# Patient Record
Sex: Female | Born: 1965 | Race: Black or African American | Hispanic: No | Marital: Single | Smoking: Former smoker
Health system: Southern US, Community
[De-identification: ages and names within clinical notes are randomized; demographics above are authoritative.]

## PROBLEM LIST (undated history)

## (undated) DIAGNOSIS — I10 Essential (primary) hypertension: Secondary | ICD-10-CM

## (undated) DIAGNOSIS — E7800 Pure hypercholesterolemia, unspecified: Secondary | ICD-10-CM

## (undated) DIAGNOSIS — Z800 Family history of malignant neoplasm of digestive organs: Secondary | ICD-10-CM

## (undated) HISTORY — DX: Family history of malignant neoplasm of digestive organs: Z80.0

---

## 2005-08-03 ENCOUNTER — Emergency Department (HOSPITAL_COMMUNITY): Admission: EM | Admit: 2005-08-03 | Discharge: 2005-08-03 | Payer: Self-pay | Admitting: Emergency Medicine

## 2007-02-07 ENCOUNTER — Ambulatory Visit (HOSPITAL_COMMUNITY): Admission: RE | Admit: 2007-02-07 | Discharge: 2007-02-07 | Payer: Self-pay | Admitting: Obstetrics & Gynecology

## 2010-10-18 ENCOUNTER — Emergency Department (HOSPITAL_COMMUNITY)
Admission: EM | Admit: 2010-10-18 | Discharge: 2010-10-18 | Disposition: A | Discharge status: Home / Self Care | Payer: Self-pay | Attending: Emergency Medicine | Admitting: Emergency Medicine

## 2010-10-18 DIAGNOSIS — R42 Dizziness and giddiness: Secondary | ICD-10-CM | POA: Insufficient documentation

## 2010-10-18 DIAGNOSIS — F411 Generalized anxiety disorder: Secondary | ICD-10-CM | POA: Insufficient documentation

## 2010-10-18 DIAGNOSIS — I10 Essential (primary) hypertension: Secondary | ICD-10-CM | POA: Insufficient documentation

## 2010-10-18 DIAGNOSIS — R5381 Other malaise: Secondary | ICD-10-CM | POA: Insufficient documentation

## 2010-10-18 LAB — URINALYSIS, ROUTINE W REFLEX MICROSCOPIC
Nitrite: NEGATIVE
Specific Gravity, Urine: 1.01 (ref 1.005–1.030)
Urobilinogen, UA: 0.2 mg/dL (ref 0.0–1.0)

## 2010-10-18 LAB — URINE MICROSCOPIC-ADD ON

## 2010-10-18 LAB — POCT I-STAT, CHEM 8
HCT: 36 % (ref 36.0–46.0)
Hemoglobin: 12.2 g/dL (ref 12.0–15.0)
Potassium: 3.4 mEq/L — ABNORMAL LOW (ref 3.5–5.1)
Sodium: 140 mEq/L (ref 135–145)

## 2010-10-26 NOTE — Op Note (Signed)
Sharon Fischer, Sharon Fischer               ACCOUNT NO.:  [id number]   MEDICAL RECORD NO.:  [id number]          PATIENT TYPE:  AMB   LOCATION:  DAY                           FACILITY:  [facility]   PHYSICIAN:  Lazaro Arms, M.D.   DATE OF BIRTH:  18-Aug-1965   DATE OF PROCEDURE:  02/07/2007  DATE OF DISCHARGE:                               OPERATIVE REPORT   PREOPERATIVE DIAGNOSIS:  Multiparous female desires permanent  sterilization.   POSTOPERATIVE DIAGNOSIS:  Multiparous female desires permanent  sterilization.   PROCEDURE:  Laparoscopic bilateral tubal ligation.   SURGEON:  Lazaro Arms, M.D.   ANESTHESIA:  General endotracheal anesthesia.   FINDINGS:  The patient had normal uterus, tubes, and ovaries.  Some  adhesions from her previous vertical incision of the omentum.   DESCRIPTION OF PROCEDURE:  The patient was taken to the operating room  and placed in the supine position where she underwent general  endotracheal anesthesia.  She was prepped and draped in the usual  sterile fashion.  A Hulka tenaculum was placed in the vagina.  An  incision was made in the umbilicus.  The Veress needle was used.  The  peritoneal cavity was insufflated.  A non-bladed 11 mm trocar was placed  in the peritoneal cavity under direct visualization without difficulty.  The fallopian tubes were identified.  They were burned using the  electrocautery unit, a 2.5 cm segment bilaterally.  There was good  hemostasis.  The instruments were removed.  The gas was allowed to  escape.  The fascia was closed with a single 0 Vicryl suture.  The  subcutaneous tissues were reapproximated with 0 Vicryl suture. The skin  was closed using skin staples.  The patient tolerated the procedure  well.  She experienced no blood loss.  She was taken to the recovery  room in good, stable condition.  All counts were correct x3.      Lazaro Arms, M.D.  Electronically Signed     LHE/MEDQ  D:  02/07/2007  T:  02/07/2007   Job:  [id number]

## 2011-03-25 LAB — DIFFERENTIAL
Basophils Absolute: 0.1
Lymphocytes Relative: 37
Monocytes Absolute: 0.5
Neutro Abs: 2.8
Neutrophils Relative %: 52

## 2011-03-25 LAB — CBC
HCT: 35.7 — ABNORMAL LOW
MCV: 80.9
Platelets: 251
RDW: 17.2 — ABNORMAL HIGH
WBC: 5.4

## 2011-03-25 LAB — COMPREHENSIVE METABOLIC PANEL
Albumin: 3.8
BUN: 4 — ABNORMAL LOW
Chloride: 106
Creatinine, Ser: 0.81
Glucose, Bld: 84
Total Bilirubin: 0.4

## 2011-03-25 LAB — URINE MICROSCOPIC-ADD ON

## 2011-03-25 LAB — URINALYSIS, ROUTINE W REFLEX MICROSCOPIC
Hgb urine dipstick: NEGATIVE
Leukocytes, UA: NEGATIVE
Nitrite: POSITIVE — AB
Protein, ur: NEGATIVE
Specific Gravity, Urine: 1.02
Urobilinogen, UA: 0.2

## 2011-03-25 LAB — HCG, QUANTITATIVE, PREGNANCY: hCG, Beta Chain, Quant, S: <2

## 2020-01-14 ENCOUNTER — Other Ambulatory Visit (HOSPITAL_COMMUNITY): Payer: Self-pay | Admitting: *Deleted

## 2020-01-14 DIAGNOSIS — N6310 Unspecified lump in the right breast, unspecified quadrant: Secondary | ICD-10-CM

## 2020-02-11 ENCOUNTER — Ambulatory Visit (HOSPITAL_COMMUNITY)
Admission: RE | Admit: 2020-02-11 | Discharge: 2020-02-11 | Disposition: A | Discharge status: Home / Self Care | Payer: PRIVATE HEALTH INSURANCE | Source: Ambulatory Visit | Attending: *Deleted | Admitting: *Deleted

## 2020-02-11 ENCOUNTER — Other Ambulatory Visit (HOSPITAL_COMMUNITY): Payer: Self-pay | Admitting: *Deleted

## 2020-02-11 ENCOUNTER — Other Ambulatory Visit: Payer: Self-pay

## 2020-02-11 ENCOUNTER — Encounter (HOSPITAL_COMMUNITY): Payer: Self-pay

## 2020-02-11 DIAGNOSIS — N6310 Unspecified lump in the right breast, unspecified quadrant: Secondary | ICD-10-CM

## 2020-02-18 ENCOUNTER — Other Ambulatory Visit (HOSPITAL_COMMUNITY): Payer: Self-pay | Admitting: *Deleted

## 2020-02-18 DIAGNOSIS — R928 Other abnormal and inconclusive findings on diagnostic imaging of breast: Secondary | ICD-10-CM

## 2020-02-24 ENCOUNTER — Other Ambulatory Visit: Payer: Self-pay

## 2020-02-28 ENCOUNTER — Other Ambulatory Visit (HOSPITAL_COMMUNITY): Payer: Self-pay | Admitting: *Deleted

## 2020-02-28 ENCOUNTER — Other Ambulatory Visit: Payer: Self-pay

## 2020-02-28 ENCOUNTER — Ambulatory Visit (HOSPITAL_COMMUNITY)
Admission: RE | Admit: 2020-02-28 | Discharge: 2020-02-28 | Disposition: A | Discharge status: Home / Self Care | Payer: Medicaid Other | Source: Ambulatory Visit | Attending: *Deleted | Admitting: *Deleted

## 2020-02-28 DIAGNOSIS — R928 Other abnormal and inconclusive findings on diagnostic imaging of breast: Secondary | ICD-10-CM

## 2020-02-28 MED ORDER — LIDOCAINE HCL (PF) 2 % IJ SOLN
INTRAMUSCULAR | Status: AC
  Filled 2020-02-28: qty 30

## 2020-02-28 MED ORDER — LIDOCAINE-EPINEPHRINE (PF) 1 %-1:200000 IJ SOLN
INTRAMUSCULAR | Status: AC
  Filled 2020-02-28: qty 30

## 2020-03-03 ENCOUNTER — Inpatient Hospital Stay (HOSPITAL_COMMUNITY): Admission: RE | Admit: 2020-03-03 | Payer: Self-pay | Source: Ambulatory Visit

## 2020-03-05 LAB — SURGICAL PATHOLOGY

## 2020-03-12 ENCOUNTER — Encounter (INDEPENDENT_AMBULATORY_CARE_PROVIDER_SITE_OTHER): Payer: Self-pay

## 2020-03-12 ENCOUNTER — Encounter: Payer: Self-pay | Admitting: General Surgery

## 2020-03-12 ENCOUNTER — Other Ambulatory Visit: Payer: Self-pay

## 2020-03-12 ENCOUNTER — Ambulatory Visit (INDEPENDENT_AMBULATORY_CARE_PROVIDER_SITE_OTHER): Payer: Medicaid Other | Admitting: General Surgery

## 2020-03-12 VITALS — BP 123/76 | HR 94 | Temp 98.3°F | Resp 14 | Ht 67.5 in | Wt 184.0 lb

## 2020-03-12 DIAGNOSIS — Z171 Estrogen receptor negative status [ER-]: Secondary | ICD-10-CM | POA: Diagnosis not present

## 2020-03-12 DIAGNOSIS — C50211 Malignant neoplasm of upper-inner quadrant of right female breast: Secondary | ICD-10-CM

## 2020-03-12 NOTE — Patient Instructions (Signed)
° °Breast Cancer, Female ° °Breast cancer is a malignant growth of tissue (tumor) in the breast. Unlike noncancerous (benign) tumors, malignant tumors are cancerous and can spread to other parts of the body. The two most common types of breast cancer start in the milk ducts (ductal carcinoma) or in the lobules where milk is made in the breast (lobular carcinoma). Breast cancer is one of the most common types of cancer in women. °What are the causes? °The exact cause of female breast cancer is unknown. °What increases the risk? °The following factors may make you more likely to develop this condition: °· Being older than 55 years of age. °· Race and ethnicity. Caucasian women generally have an increased risk, but African-American women are more likely to develop the disease before age 45. °· Having a family history of breast cancer. °· Having had breast cancer in the past. °· Having certain noncancerous conditions of the breast, such as dense breast tissue. °· Having the BRCA1 and BRCA2 genes. °· Having a history of radiation exposure. °· Obesity. °· Starting menopause after age 55. °· Starting your menstrual periods before age 12. °· Having never been pregnant or having your first child after age 30. °· Having never breastfed. °· Using hormone therapy after menopause. °· Using birth control pills. °· Drinking more than one alcoholic drink a day. °· Exposure to the drug DES, which was given to pregnant women from the 1940s to the 1970s. °What are the signs or symptoms? °Symptoms of this condition include: °· A painless lump or thickening in your breast. °· Changes in the size or shape of your breast. °· Breast skin changes, such as puckering or dimpling. °· Nipple abnormalities, such as scaling, crustiness, redness, or pulling in (retraction). °· Nipple discharge that is bloody or clear. °How is this diagnosed? °This condition may be diagnosed by: °· Taking your medical history and doing a physical exam. During the  exam, your health care provider will feel the tissue around your breast and under your arms. °· Taking a sample of nipple discharge. The sample will be examined under a microscope. °· Performing imaging tests, such as breast X-rays (mammogram), breast ultrasound exams, or an MRI. °· Taking a tissue sample (biopsy) from the breast. The sample will be examined under a microscope to look for cancer cells. °· Taking a sample from the lymph nodes near the affected breast (sentinel node biopsy). °Your cancer will be staged to determine its severity and extent. Staging is a careful attempt to find out the size of the tumor, whether the cancer has spread, and if so, to what parts of the body. Staging also includes testing your tumor for certain receptors, such as estrogen, progesterone, and human epidermal growth factor receptor 2 (HER2). This will help your cancer care team decide on a treatment that will work best for you. You may need to have more tests to determine the stage of your cancer. Stages include the following: °· Stage 0--The tumor has not spread to other breast tissue. °· Stage I--The cancer is only found in the breast or may be in the lymph nodes. The tumor may be up to ¾ in (2 cm) wide. °· Stage II--The cancer has spread to nearby lymph nodes. The tumor may be up to 2 in (5 cm) wide. °· Stage III--The cancer has spread to more distant lymph nodes. The tumor may be larger than 2 in (5 cm) wide. °· Stage IV--The cancer has spread to other parts   the body, such as the bones, brain, liver, or lungs. How is this treated? Treatment for this condition depends on the type and stage of the breast cancer. It may be treated with:  Surgery. This may involve breast-conserving surgery (lumpectomy or partial mastectomy) in which only the part of the breast containing the cancer is removed. Some normal tissue surrounding this area may also be removed. In some cases, surgery may be done to remove the entire breast  (mastectomy) and nipple. Lymph nodes may also be removed.  Radiation therapy, which uses high-energy rays to kill cancer cells.  Chemotherapy, which is the use of drugs to kill cancer cells.  Hormone therapy, which involves taking medicine to adjust the hormone levels in your body. You may take medicine to decrease your estrogen levels. This can help stop cancer cells from growing.  Targeted therapy, in which drugs are used to block the growth and spread of cancer cells. These drugs target a specific part of the cancer cell and usually cause fewer side effects than chemotherapy. Targeted therapy may be used alone or in combination with chemotherapy.  A combination of surgery, radiation, chemotherapy, or hormone therapy may be needed to treat breast cancer. Follow these instructions at home:  Take over-the-counter and prescription medicines only as told by your health care provider.  Eat a healthy diet. A healthy diet includes lots of fruits and vegetables, low-fat dairy products, lean meats, and fiber. ? Make sure half your plate is filled with fruits or vegetables. ? Choose high-fiber foods such as whole-grain breads and cereals.  Consider joining a support group. This may help you learn to cope with the stress of having breast cancer.  Talk to your health care team about exercise and physical activity. The right exercise program can: ? Help prevent or reduce symptoms such as fatigue or depression. ? Improve overall health and survival rates.  Keep all follow-up visits as told by your health care provider. This is important. Where to find more information  American Cancer Society: www.cancer.[place]: www.cancer.gov Contact a health care provider if:  You have a sudden increase in pain.  You have any symptoms or changes that concern you.  You lose weight without trying.  You notice a new lump in either breast or under your arm.  You develop swelling in  either arm or hand.  You have a fever.  You notice new fatigue or weakness. Get help right away if:  You have chest pain or trouble breathing.  You faint. Summary  Breast cancer is a malignant growth of tissue (tumor) in the breast.  Your cancer will be staged to determine its severity and extent.  Treatment for this condition depends on the type and stage of the breast cancer. This information is not intended to replace advice given to you by your health care provider. Make sure you discuss any questions you have with your health care provider. Document Revised: 05/12/2017 Document Reviewed: 01/23/2017 Elsevier Patient Education  [place].

## 2020-03-12 NOTE — Progress Notes (Signed)
Sharon Fischer; [id number]; 02-04-1966   HPI Patient is a 54 year old black female who was referred to my care by the [facility] for evaluation treatment of newly diagnosed right breast cancer.  She states she felt a lump in her right breast approximately 3 months ago.  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  The masses in the upper, inner quadrant of the right breast.  There is also another indistinct mass in the right upper outer quadrant of the right breast which may be carcinoma.  It was suggested that if any of these biopsies were positive, a core biopsy under MRI guidance of the upper outer quadrant mass should be performed.  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  Patient denies any family history of breast cancer.  She denies any nipple discharge.  She has a known sebaceous cyst in the left axilla which has been present for many years. History reviewed. No pertinent past medical history.  History reviewed. No pertinent surgical history.  History reviewed. No pertinent family history.  Current Outpatient Medications on File Prior to Visit  Medication Sig Dispense Refill  . fluticasone (FLONASE) 50 MCG/ACT nasal spray Place into both nostrils.    Marland Kitchen lisinopril-hydrochlorothiazide (ZESTORETIC) 20-12.5 MG tablet Take 1 tablet by mouth daily.     No current facility-administered medications on file prior to visit.    No Known Allergies  Social History   Substance and Sexual Activity  Alcohol Use Never    Social History   Tobacco Use  Smoking Status Current Every Day Smoker  . Types: Cigarettes  Smokeless Tobacco Never Used  Tobacco Comment   7-8 cigs /day    Review of Systems  Constitutional: Negative.   HENT: Negative.   Eyes: Negative.   Respiratory: Negative.   Cardiovascular: Negative.   Gastrointestinal: Negative.    Genitourinary: Negative.   Musculoskeletal: Negative.   Skin: Negative.   Neurological: Positive for headaches.  Endo/Heme/Allergies: Negative.   Psychiatric/Behavioral: Negative.     Objective   Vitals:   03/12/20 1055  BP: 123/76  Pulse: 94  Resp: 14  Temp: 98.3 F (36.8 C)  SpO2: 99%    Physical Exam Vitals reviewed.  Constitutional:      Appearance: Normal appearance. She is not ill-appearing.  HENT:     Head: Normocephalic and atraumatic.  Cardiovascular:     Rate and Rhythm: Normal rate and regular rhythm.     Heart sounds: Normal heart sounds. No murmur heard.  No friction rub. No gallop.   Pulmonary:     Effort: Pulmonary effort is normal. No respiratory distress.     Breath sounds: Normal breath sounds. No stridor. No wheezing, rhonchi or rales.  Lymphadenopathy:     Cervical: No cervical adenopathy.  Skin:    General: Skin is warm and dry.  Neurological:     Mental Status: She is alert and oriented to person, place, and time.   Breast: Right breast with dominant mass noted in the upper, inner quadrant of the right breast.  A palpable lymph node is noted in the right axilla.  Left breast examination reveals no dominant mass, nipple discharge, or dimpling.  The left axilla does have a nodule present which appears to be more a sebaceous cyst.  The punctum is present over lying the cyst in the left axilla.  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  ER/PR negative Mass and right breast, upper outer quadrant, not biopsied Ductal papilloma, left breast Plan   I am concerned that she may have right breast cancer in multiple quadrants.  I do feel an MRI of both breasts is indicated given the findings in both breasts.  I am concerned that she may not be a candidate for a partial mastectomy for the right breast.  Will arrange MRI of the breasts with possible biopsy of the right  breast lesion through the health department.  Further management is pending those results.

## 2020-03-13 DIAGNOSIS — C50919 Malignant neoplasm of unspecified site of unspecified female breast: Secondary | ICD-10-CM

## 2020-03-13 HISTORY — DX: Malignant neoplasm of unspecified site of unspecified female breast: C50.919

## 2020-03-24 ENCOUNTER — Encounter (HOSPITAL_COMMUNITY): Payer: Self-pay

## 2020-03-24 NOTE — Progress Notes (Signed)
I have attempted to reach the patient today for an introductory phone call. Unable to reach patient at this time. I will plan to meet with the patient tomorrow during her initial visit with Dr. Delton Coombes.

## 2020-03-25 ENCOUNTER — Ambulatory Visit (HOSPITAL_COMMUNITY): Payer: Self-pay | Admitting: Hematology

## 2020-03-26 ENCOUNTER — Other Ambulatory Visit (HOSPITAL_COMMUNITY): Payer: Self-pay | Admitting: General Surgery

## 2020-03-26 DIAGNOSIS — Z171 Estrogen receptor negative status [ER-]: Secondary | ICD-10-CM

## 2020-03-26 DIAGNOSIS — C50211 Malignant neoplasm of upper-inner quadrant of right female breast: Secondary | ICD-10-CM

## 2020-03-27 ENCOUNTER — Other Ambulatory Visit: Payer: Self-pay | Admitting: Physician Assistant

## 2020-03-27 NOTE — Addendum Note (Signed)
Addended by: Candiss Norse A on: 03/27/2020 11:44 AM   Modules accepted: Orders

## 2020-03-30 ENCOUNTER — Ambulatory Visit (HOSPITAL_COMMUNITY)
Admission: RE | Admit: 2020-03-30 | Discharge: 2020-03-30 | Disposition: A | Discharge status: Home / Self Care | Payer: Medicaid Other | Source: Ambulatory Visit | Attending: General Surgery | Admitting: General Surgery

## 2020-03-30 ENCOUNTER — Other Ambulatory Visit: Payer: Self-pay

## 2020-03-30 DIAGNOSIS — F17210 Nicotine dependence, cigarettes, uncomplicated: Secondary | ICD-10-CM | POA: Insufficient documentation

## 2020-03-30 DIAGNOSIS — C50211 Malignant neoplasm of upper-inner quadrant of right female breast: Secondary | ICD-10-CM | POA: Diagnosis not present

## 2020-03-30 DIAGNOSIS — Z79899 Other long term (current) drug therapy: Secondary | ICD-10-CM | POA: Insufficient documentation

## 2020-03-30 DIAGNOSIS — Z171 Estrogen receptor negative status [ER-]: Secondary | ICD-10-CM | POA: Diagnosis not present

## 2020-03-30 HISTORY — PX: IR IMAGING GUIDED PORT INSERTION: IMG5740

## 2020-03-30 MED ORDER — MIDAZOLAM HCL 2 MG/2ML IJ SOLN
INTRAMUSCULAR | Status: AC
  Filled 2020-03-30: qty 2

## 2020-03-30 MED ORDER — MIDAZOLAM HCL 2 MG/2ML IJ SOLN
INTRAMUSCULAR | Status: AC | PRN
  Administered 2020-03-30: 1 mg via INTRAVENOUS

## 2020-03-30 MED ORDER — LIDOCAINE HCL 1 % IJ SOLN
INTRAMUSCULAR | Status: AC
  Filled 2020-03-30: qty 20

## 2020-03-30 MED ORDER — FENTANYL CITRATE (PF) 100 MCG/2ML IJ SOLN
INTRAMUSCULAR | Status: AC | PRN
  Administered 2020-03-30: 50 ug via INTRAVENOUS

## 2020-03-30 MED ORDER — HEPARIN SOD (PORK) LOCK FLUSH 100 UNIT/ML IV SOLN
INTRAVENOUS | Status: AC
  Administered 2020-03-30: 500 [IU]
  Filled 2020-03-30: qty 5

## 2020-03-30 MED ORDER — SODIUM CHLORIDE 0.9 % IV SOLN: INTRAVENOUS | Status: DC

## 2020-03-30 MED ORDER — FENTANYL CITRATE (PF) 100 MCG/2ML IJ SOLN
INTRAMUSCULAR | Status: DC
Start: 2020-03-30 — End: 2020-03-31
  Filled 2020-03-30: qty 2

## 2020-03-30 MED ORDER — LIDOCAINE HCL (PF) 1 % IJ SOLN
INTRAMUSCULAR | Status: AC | PRN
  Administered 2020-03-30: 10 mL

## 2020-03-30 MED ORDER — CEFAZOLIN SODIUM-DEXTROSE 2-4 GM/100ML-% IV SOLN
2.0000 g | Freq: Once | INTRAVENOUS | Status: AC
  Administered 2020-03-30: 2 g via INTRAVENOUS
  Filled 2020-03-30: qty 100

## 2020-03-30 NOTE — Sedation Documentation (Signed)
Attempted to call report to short stay. No one was available to take report at this time.

## 2020-03-30 NOTE — Procedures (Signed)
Interventional Radiology Procedure Note  Procedure: Placement of a right IJ approach single lumen PowerPort.  Tip is positioned at the superior cavoatrial junction and catheter is ready for immediate use.  Complications: None Recommendations:  - Ok to shower tomorrow - Do not submerge for 7 days - Routine line care   Signed,  Serge Main S. Bates [place], DO   

## 2020-03-30 NOTE — Discharge Instructions (Addendum)
Implanted Port Insertion, Care After This sheet gives you information about how to care for yourself after your procedure. Your health care provider may also give you more specific instructions. If you have problems or questions, contact your health care provider. What can I expect after the procedure? After the procedure, it is common to have:  Discomfort at the port insertion site.  Bruising on the skin over the port. This should improve over 3-4 days. Follow these instructions at home: Port care  After your port is placed, you will get a manufacturer's information card. The card has information about your port. Keep this card with you at all times.  Take care of the port as told by your health care provider. Ask your health care provider if you or a family member can get training for taking care of the port at home. A home health care nurse may also take care of the port.  Make sure to remember what type of port you have. Incision care      Follow instructions from your health care provider about how to take care of your port insertion site. Make sure you: ? Wash your hands with soap and water before and after you change your bandage (dressing). If soap and water are not available, use hand sanitizer. ? Change your dressing as told by your health care provider. ? Leave stitches (sutures), skin glue, or adhesive strips in place. These skin closures may need to stay in place for 2 weeks or longer. If adhesive strip edges start to loosen and curl up, you may trim the loose edges. Do not remove adhesive strips completely unless your health care provider tells you to do that.  Check your port insertion site every day for signs of infection. Check for: ? Redness, swelling, or pain. ? Fluid or blood. ? Warmth. ? Pus or a bad smell. Activity  Return to your normal activities as told by your health care provider. Ask your health care provider what activities are safe for you.  Do not  lift anything that is heavier than 10 lb (4.5 kg), or the limit that you are told, until your health care provider says that it is safe. General instructions  Take over-the-counter and prescription medicines only as told by your health care provider.  Do not take baths, swim, or use a hot tub until your health care provider approves. Ask your health care provider if you may take showers. You may only be allowed to take sponge baths.  Do not drive for 24 hours if you were given a sedative during your procedure.  Wear a medical alert bracelet in case of an emergency. This will tell any health care providers that you have a port.  Keep all follow-up visits as told by your health care provider. This is important. Contact a health care provider if:  You cannot flush your port with saline as directed, or you cannot draw blood from the port.  You have a fever or chills.  You have redness, swelling, or pain around your port insertion site.  You have fluid or blood coming from your port insertion site.  Your port insertion site feels warm to the touch.  You have pus or a bad smell coming from the port insertion site. Get help right away if:  You have chest pain or shortness of breath.  You have bleeding from your port that you cannot control. Summary  Take care of the port as told by your health   care provider. Keep the manufacturer's information card with you at all times.  Change your dressing as told by your health care provider.  Contact a health care provider if you have a fever or chills or if you have redness, swelling, or pain around your port insertion site.  Keep all follow-up visits as told by your health care provider. This information is not intended to replace advice given to you by your health care provider. Make sure you discuss any questions you have with your health care provider. Document Revised: 12/26/2017 Document Reviewed: 12/26/2017 Elsevier Patient Education   2020 Elsevier Inc. Moderate Conscious Sedation, Adult Sedation is the use of medicines to promote relaxation and relieve discomfort and anxiety. Moderate conscious sedation is a type of sedation. Under moderate conscious sedation, you are less alert than normal, but you are still able to respond to instructions, touch, or both. Moderate conscious sedation is used during short medical and dental procedures. It is milder than deep sedation, which is a type of sedation under which you cannot be easily woken up. It is also milder than general anesthesia, which is the use of medicines to make you unconscious. Moderate conscious sedation allows you to return to your regular activities sooner. Tell a health care provider about:  Any allergies you have.  All medicines you are taking, including vitamins, herbs, eye drops, creams, and over-the-counter medicines.  Use of steroids (by mouth or creams).  Any problems you or family members have had with sedatives and anesthetic medicines.  Any blood disorders you have.  Any surgeries you have had.  Any medical conditions you have, such as sleep apnea.  Whether you are pregnant or may be pregnant.  Any use of cigarettes, alcohol, marijuana, or street drugs. What are the risks? Generally, this is a safe procedure. However, problems may occur, including:  Getting too much medicine (oversedation).  Nausea.  Allergic reaction to medicines.  Trouble breathing. If this happens, a breathing tube may be used to help with breathing. It will be removed when you are awake and breathing on your own.  Heart trouble.  Lung trouble. What happens before the procedure? Staying hydrated Follow instructions from your health care provider about hydration, which may include:  Up to 2 hours before the procedure - you may continue to drink clear liquids, such as water, clear fruit juice, black coffee, and plain tea. Eating and drinking restrictions Follow  instructions from your health care provider about eating and drinking, which may include:  8 hours before the procedure - stop eating heavy meals or foods such as meat, fried foods, or fatty foods.  6 hours before the procedure - stop eating light meals or foods, such as toast or cereal.  6 hours before the procedure - stop drinking milk or drinks that contain milk.  2 hours before the procedure - stop drinking clear liquids. Medicine Ask your health care provider about:  Changing or stopping your regular medicines. This is especially important if you are taking diabetes medicines or blood thinners.  Taking medicines such as aspirin and ibuprofen. These medicines can thin your blood. Do not take these medicines before your procedure if your health care provider instructs you not to.  Tests and exams  You will have a physical exam.  You may have blood tests done to show: ? How well your kidneys and liver are working. ? How well your blood can clot. General instructions  Plan to have someone take you home from the   hospital or clinic.  If you will be going home right after the procedure, plan to have someone with you for 24 hours. What happens during the procedure?  An IV tube will be inserted into one of your veins.  Medicine to help you relax (sedative) will be given through the IV tube.  The medical or dental procedure will be performed. What happens after the procedure?  Your blood pressure, heart rate, breathing rate, and blood oxygen level will be monitored often until the medicines you were given have worn off.  Do not drive for 24 hours. This information is not intended to replace advice given to you by your health care provider. Make sure you discuss any questions you have with your health care provider. Document Revised: 05/12/2017 Document Reviewed: 09/19/2015 Elsevier Patient Education  2020 Elsevier Inc.  

## 2020-03-30 NOTE — Progress Notes (Signed)
Discharge instructions reviewed with pt and Terri (her niece) via telephone. Both voice understanding.

## 2020-03-30 NOTE — H&P (Signed)
Chief Complaint: Patient was seen in consultation today for breast cancer/Port-a-cath placement.  Referring Physician(s): Toth,Paul III  Supervising Physician: Corrie Mckusick  Patient Status: [facility] - Out-pt  History of Present Illness: Sharon Fischer is a 54 y.o. female with a past medical history of breast cancer and current tobacco abuse. She was unfortunately diagnosed with breast cancer in 02/2020. Her cancer is managed by Dr. Marlou Starks (surgery) and Dr. Delton Coombes (oncology). She has tentative plans to begin systemic chemotherapy as management.  IR consulted by Dr. Marlou Starks for possible image-guided Port-a-cath placement. Patient awake and alert laying in bed with no complaints at this time. Denies fever, chills, chest pain, dyspnea, abdominal pain, or headache.   No past medical history on file.  No past surgical history on file.  Allergies: Patient has no known allergies.  Medications: Prior to Admission medications   Medication Sig Start Date End Date Taking? Authorizing Provider  lisinopril-hydrochlorothiazide (ZESTORETIC) 20-12.5 MG tablet Take 1 tablet by mouth daily. 01/14/20  Yes [provider]  fluticasone (FLONASE) 50 MCG/ACT nasal spray Place into both nostrils. 11/05/19   [provider]     No family history on file.  Social History   Socioeconomic History  . Marital status: Single    Spouse name: Not on file  . Number of children: Not on file  . Years of education: Not on file  . Highest education level: Not on file  Occupational History  . Not on file  Tobacco Use  . Smoking status: Current Every Day Smoker    Types: Cigarettes  . Smokeless tobacco: Never Used  . Tobacco comment: 7-8 cigs /day  Substance and Sexual Activity  . Alcohol use: Never  . Drug use: Never  . Sexual activity: Not on file  Other Topics Concern  . Not on file  Social History Narrative  . Not on file   Social Determinants of Health   Financial Resource  Strain:   . Difficulty of Paying Living Expenses: Not on file  Food Insecurity:   . Worried About Charity fundraiser in the Last Year: Not on file  . Ran Out of Food in the Last Year: Not on file  Transportation Needs:   . Lack of Transportation (Medical): Not on file  . Lack of Transportation (Non-Medical): Not on file  Physical Activity:   . Days of Exercise per Week: Not on file  . Minutes of Exercise per Session: Not on file  Stress:   . Feeling of Stress : Not on file  Social Connections:   . Frequency of Communication with Friends and Family: Not on file  . Frequency of Social Gatherings with Friends and Family: Not on file  . Attends Religious Services: Not on file  . Active Member of Clubs or Organizations: Not on file  . Attends Archivist Meetings: Not on file  . Marital Status: Not on file     Review of Systems: A 12 point ROS discussed and pertinent positives are indicated in the HPI above.  All other systems are negative.  Review of Systems  Constitutional: Negative for chills and fever.  Respiratory: Negative for shortness of breath and wheezing.   Cardiovascular: Negative for chest pain and palpitations.  Gastrointestinal: Negative for abdominal pain.  Neurological: Negative for headaches.  Psychiatric/Behavioral: Negative for behavioral problems and confusion.    Vital Signs: BP 119/67   Pulse 91   Ht 5' 7.5" (1.715 m)   Wt 185 lb (83.9 kg)  SpO2 99%   BMI 28.55 kg/m   Physical Exam Vitals and nursing note reviewed.  Constitutional:      General: She is not in acute distress.    Appearance: Normal appearance.  Cardiovascular:     Rate and Rhythm: Normal rate and regular rhythm.     Heart sounds: Normal heart sounds. No murmur heard.   Pulmonary:     Effort: Pulmonary effort is normal. No respiratory distress.     Breath sounds: Normal breath sounds. No wheezing.  Skin:    General: Skin is warm and dry.  Neurological:     Mental  Status: She is alert and oriented to person, place, and time.  Psychiatric:        Mood and Affect: Mood normal.        Behavior: Behavior normal.      MD Evaluation Airway: WNL Heart: WNL Abdomen: WNL Chest/ Lungs: WNL ASA  Classification: 3 Mallampati/Airway Score: Two   Imaging: No results found.  Labs:  CBC: No results for input(s): WBC, HGB, HCT, PLT in the last 8760 hours.  COAGS: No results for input(s): INR, APTT in the last 8760 hours.  BMP: No results for input(s): NA, K, CL, CO2, GLUCOSE, BUN, CALCIUM, CREATININE, GFRNONAA, GFRAA in the last 8760 hours.  Invalid input(s): CMP   Assessment and Plan:  Breast cancer with tentative plans to begin systemic chemotherapy as management. Plan for image-guided Port-a-cath placement today in IR. Patient is NPO. Afebrile. She does not take blood thinners.  Risks and benefits of image-guided Port-a-catheter placement were discussed with the patient including, but not limited to bleeding, infection, pneumothorax, or fibrin sheath development and need for additional procedures. All of the patient's questions were answered, patient is agreeable to proceed. Consent signed and in chart.   Thank you for this interesting consult.  I greatly enjoyed meeting Sharon Fischer and look forward to participating in their care.  A copy of this report was sent to the requesting provider on this date.  Electronically Signed: Earley Abide, PA-C 03/30/2020, 11:47 AM   I spent a total of 30 Minutes in face to face in clinical consultation, greater than 50% of which was counseling/coordinating care for breast cancer/Port-a-cath placement.

## 2020-03-30 NOTE — Progress Notes (Signed)
Location of Breast Cancer: Right Breast Cancer- UIQ  Did patient present with symptoms (if so, please note symptoms) or was this found on screening mammography?: Screening mammogram  Mammogram: 2.2 cm mass in the upper inner quadrant of the right breast as well as an 8 mm mass centrally in the right breast.  2 abnormal lymph nodes.  Histology per Pathology Report:   Receptor Status: ER(-), PR (-), Her2-neu (-), Ki-67(80%)   Past/Anticipated interventions by surgeon, if any: Dr. Marlou Starks 03/24/2020 -The patient appears to have a 2.2 cm cancer in the UIQ of the right breast that is triple negative with 2 positive lymph nodes.   -I think she would benefit from neoadjuvant chemotherapy. -I will refer her to the medical oncologists for consideration of this. -She will need to have a port a cath placed by interventional radiology which we will arrange. -Hopefully receiving neoadjuvant chemotherapy can downstage her axilla which may keep her from having to have a full axillary lymph node dissection.  Past/Anticipated interventions by medical oncology, if any: Chemotherapy  No appointments scheduled yet.   Lymphedema issues, if any:  No  Pain issues, if any:  Pain at port insertion site and mild pain in her breast from biopsy.  SAFETY ISSUES:  Prior radiation? No  Pacemaker/ICD? No  Possible current pregnancy? Postmenopausal  Is the patient on methotrexate? no  Current Complaints / other details:   -Would like to have Chemo here in [place], Gloriajean Dell, RN 03/30/2020,10:33 AM

## 2020-03-31 ENCOUNTER — Ambulatory Visit
Admission: RE | Admit: 2020-03-31 | Discharge: 2020-03-31 | Disposition: A | Discharge status: Home / Self Care | Payer: Medicaid Other | Source: Ambulatory Visit | Attending: Radiation Oncology | Admitting: Radiation Oncology

## 2020-03-31 ENCOUNTER — Encounter: Payer: Self-pay | Admitting: Radiation Oncology

## 2020-03-31 ENCOUNTER — Other Ambulatory Visit: Payer: Self-pay

## 2020-03-31 DIAGNOSIS — Z170 Estrogen receptor positive status [ER+]: Secondary | ICD-10-CM

## 2020-03-31 DIAGNOSIS — Z79899 Other long term (current) drug therapy: Secondary | ICD-10-CM | POA: Insufficient documentation

## 2020-03-31 DIAGNOSIS — C50211 Malignant neoplasm of upper-inner quadrant of right female breast: Secondary | ICD-10-CM | POA: Insufficient documentation

## 2020-03-31 DIAGNOSIS — Z171 Estrogen receptor negative status [ER-]: Secondary | ICD-10-CM | POA: Insufficient documentation

## 2020-03-31 DIAGNOSIS — F17210 Nicotine dependence, cigarettes, uncomplicated: Secondary | ICD-10-CM | POA: Diagnosis not present

## 2020-03-31 NOTE — Progress Notes (Signed)
Radiation Oncology         [phone] ________________________________  Initial Outpatient Consultation - Conducted via telephone due to current COVID-19 concerns for limiting patient exposure  I spoke with the patient to conduct this consult visit via telephone to spare the patient unnecessary potential exposure in the healthcare setting during the current COVID-19 pandemic. The patient was notified in advance and was offered a [facility] meeting to allow for face to face communication but unfortunately reported that they did not have the appropriate resources/technology to support such a visit and instead preferred to proceed with a telephone consult.     Name: Sharon Fischer        MRN: [id number]  Date of Service: 03/31/2020 DOB: 1965-09-12  AG:TXMI, Linwood Dibbles D., PA-C  Jovita Kussmaul, MD     REFERRING PHYSICIAN: [name], MD   DIAGNOSIS: The encounter diagnosis was Malignant neoplasm of upper-inner quadrant of right breast in female, estrogen receptor positive ([place]).   HISTORY OF PRESENT ILLNESS: Sharon Fischer is a 54 y.o. female with a new diagnosis of right breast cancer. The patient was noted to have a palpable mass in the right breast. Diagnostic imaging revealed a 2.2 cm irregular mass in the upper inner right breast with an 8 mm slightly lobulated circumscribed oval mass.  In the left breast there was a 1.5 cm partially circumscribed obscured mass within the left upper inner breast.  By ultrasound the right breast mass measured 2.2 x 1.8 x 1.7 cm at the 1 o'clock position consistent with the palpable abnormality.  No sonographic correlate was seen to the 8 mm lobulated lesion seen on mammography and there were 2 abnormal appearing right axillary nodes.  Her left breast revealed a partially indistinct mass at 11:00 measuring 1.5 x 0.9 x 1.1 cm and no adenopathy was identified.  She subsequently underwent biopsy of the right breast right axillary node, and left breast on 02/28/2020.  In  the right breast at 1:00 an invasive ductal carcinoma was identified that was grade 2-3, the tumor was ER/PR negative HER-2 was equivocal by IHC and negative by FISH.  The lymph node that was sampled was also consistent with carcinoma but no distinct nodal tissue was noted.  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  She is seen today to discuss treatment recommendations for her cancer.  She also met with Dr. Marlou Starks who recommends neoadjuvant chemotherapy with the hopes of downsizing her disease.  Yesterday she had a PAC placed. She is being scheduled as well for MRI.  She is not yet scheduled to meet with medical oncology.  PREVIOUS RADIATION THERAPY: No   PAST MEDICAL HISTORY:  Past Medical History:  Diagnosis Date  . Breast cancer ([place]) 03/2020       PAST SURGICAL HISTORY: Past Surgical History:  Procedure Laterality Date  . IR IMAGING GUIDED PORT INSERTION  03/30/2020     FAMILY HISTORY: History reviewed. No pertinent family history.   SOCIAL HISTORY:  reports that she has been smoking cigarettes. She has never used smokeless tobacco. She reports that she does not drink alcohol and does not use drugs. The patient is single and lives in [place]. She is accompanied by her sister. She prefers her surgical care in [place], but is interested in treatment closer to home.    ALLERGIES: Patient has no known allergies.   MEDICATIONS:  Current Outpatient Medications  Medication Sig Dispense Refill  . fluticasone (FLONASE) 50 MCG/ACT nasal spray Place  into both nostrils.    Marland Kitchen lisinopril-hydrochlorothiazide (ZESTORETIC) 20-12.5 MG tablet Take 1 tablet by mouth daily.    Marland Kitchen atorvastatin (LIPITOR) 40 MG tablet Take by mouth.     No current facility-administered medications for this encounter.     REVIEW OF SYSTEMS: On review of systems, the patient reports that she is doing well overall. She has some soreness at the site of her [facility] placement. She denies any pain  however in her breast, or concerns with nipple bleeding or discharge. No other complaints are verbalized.      PHYSICAL EXAM:  Wt Readings from Last 3 Encounters:  03/31/20 187 lb (84.8 kg)  03/30/20 185 lb (83.9 kg)  03/12/20 184 lb (83.5 kg)   Unable to assess due to encounter type.    ECOG = 0  0 - Asymptomatic (Fully active, able to carry on all predisease activities without restriction)  1 - Symptomatic but completely ambulatory (Restricted in physically strenuous activity but ambulatory and able to carry out work of a light or sedentary nature. For example, light housework, office work)  2 - Symptomatic, <50% in bed during the day (Ambulatory and capable of all self care but unable to carry out any work activities. Up and about more than 50% of waking hours)  3 - Symptomatic, >50% in bed, but not bedbound (Capable of only limited self-care, confined to bed or chair 50% or more of waking hours)  4 - Bedbound (Completely disabled. Cannot carry on any self-care. Totally confined to bed or chair)  5 - Death   Eustace Pen MM, Creech RH, Tormey DC, et al. [phone]). "Toxicity and response criteria of the [facility]". Price Oncol. 5 (6): 649-55    LABORATORY DATA:  Lab Results  Component Value Date   WBC 5.4 02/05/2007   HGB 12.2 10/18/2010   HCT 36.0 10/18/2010   MCV 80.9 02/05/2007   PLT 251 02/05/2007   Lab Results  Component Value Date   NA 140 10/18/2010   K 3.4 (L) 10/18/2010   CL 109 10/18/2010   CO2 25 02/05/2007   Lab Results  Component Value Date   ALT 21 02/05/2007   AST 28 02/05/2007   ALKPHOS 91 02/05/2007   BILITOT 0.4 02/05/2007      RADIOGRAPHY: IR IMAGING GUIDED PORT INSERTION  Result Date: 03/30/2020 INDICATION: 54 year old female referred for port catheter placement EXAM: IMAGE GUIDED PLACEMENT OF PORT CATHETER MEDICATIONS: 2 g Ancef; The antibiotic was administered within an appropriate time interval prior to skin  puncture. ANESTHESIA/SEDATION: Moderate (conscious) sedation was employed during this procedure. A total of Versed 1.0 mg and Fentanyl 50 mcg was administered intravenously. Moderate Sedation Time: 28 minutes. The patient's level of consciousness and vital signs were monitored continuously by radiology nursing throughout the procedure under my direct supervision. FLUOROSCOPY TIME:  Fluoroscopy Time: 0 minutes 6 seconds (1 mGy). COMPLICATIONS: None PROCEDURE: The procedure, risks, benefits, and alternatives were explained to the patient. Questions regarding the procedure were encouraged and answered. The patient understands and consents to the procedure. Ultrasound survey was performed with images stored and sent to PACs. The right neck and chest was prepped with chlorhexidine, and draped in the usual sterile fashion using maximum barrier technique (cap and mask, sterile gown, sterile gloves, large sterile sheet, hand hygiene and cutaneous antiseptic). Antibiotic prophylaxis was provided with 2.0g Ancef administered IV one hour prior to skin incision. Local anesthesia was attained by infiltration with 1% lidocaine without epinephrine. Ultrasound  demonstrated patency of the right internal jugular vein, and this was documented with an image. Under real-time ultrasound guidance, this vein was accessed with a 21 gauge micropuncture needle and image documentation was performed. A small dermatotomy was made at the access site with an 11 scalpel. A 0.018" wire was advanced into the SVC and used to estimate the length of the internal catheter. The access needle exchanged for a 39F micropuncture vascular sheath. The 0.018" wire was then removed and a 0.035" wire advanced into the IVC. An appropriate location for the subcutaneous reservoir was selected below the clavicle and an incision was made through the skin and underlying soft tissues. The subcutaneous tissues were then dissected using a combination of blunt and sharp  surgical technique and a pocket was formed. A single lumen power injectable portacatheter was then tunneled through the subcutaneous tissues from the pocket to the dermatotomy and the port reservoir placed within the subcutaneous pocket. The venous access site was then serially dilated and a peel away vascular sheath placed over the wire. The wire was removed and the port catheter advanced into position under fluoroscopic guidance. The catheter tip is positioned in the cavoatrial junction. This was documented with a spot image. The portacatheter was then tested and found to flush and aspirate well. The port was flushed with saline followed by 100 units/mL heparinized saline. The pocket was then closed in two layers using first subdermal inverted interrupted absorbable sutures followed by a running subcuticular suture. The epidermis was then sealed with Dermabond. The dermatotomy at the venous access site was also seal with Dermabond. Patient tolerated the procedure well and remained hemodynamically stable throughout. No complications encountered and no significant blood loss encountered IMPRESSION: Status post right IJ port catheter placement. Signed, Dulcy Fanny. Dellia Nims, RPVI [facility] Electronically Signed   By: Corrie Mckusick D.O.   On: 03/30/2020 13:19       IMPRESSION/PLAN: 1. Stage IIIB, cT2N1M0 grade 3, triple negative invasive ductal carcinoma of the right breast. I discussed the pathology findings and reviewed the nature of triple negative, node positive breast disease. The consensus for treatment includes neoadjuvant chemotherapy to downsize her disease. Surgical plans are to be determined by her response to neoadjuvant therapy. Most importnatly she has not met with medical oncology so we will help to coordinate this for her. Dr. Lisbeth Renshaw has reviewed her case and recommends radiation in the adjuvant setting.  I reviewed the rationale for using  adjuvant radiotherapy to the breast or chest wall as well as to regional lymph nodes following surgery with the patient and her sister. We discussed the risks, benefits, short, and long term effects of radiotherapy, and the patient is interested in proceeding, but requests being treated with both chemo and radiotherapy closer to home at [facility]. I reviewed the course that would be recommended by Dr. Lisbeth Renshaw with 6 1/2 weeks of radiotherapy to the right chest wall or breast as well as to the right regional nodes. We will defer further radiotherapy discussion and planning to the [facility] team, and see the patient back as needed.  In a visit lasting 60 minutes, greater than 50% of the time was spent face to face discussing the patient's condition, in preparation for the discussion, and coordinating the patient's care.     Carola Rhine, PAC

## 2020-04-02 ENCOUNTER — Other Ambulatory Visit: Payer: Self-pay | Admitting: *Deleted

## 2020-04-02 ENCOUNTER — Inpatient Hospital Stay: Payer: Medicaid Other | Attending: Hematology and Oncology | Admitting: Hematology and Oncology

## 2020-04-02 ENCOUNTER — Telehealth: Payer: Self-pay | Admitting: Hematology and Oncology

## 2020-04-02 ENCOUNTER — Other Ambulatory Visit: Payer: Self-pay

## 2020-04-02 VITALS — BP 126/69 | HR 80 | Temp 98.2°F | Resp 18 | Ht 67.5 in | Wt 184.5 lb

## 2020-04-02 DIAGNOSIS — Z5189 Encounter for other specified aftercare: Secondary | ICD-10-CM | POA: Insufficient documentation

## 2020-04-02 DIAGNOSIS — Z170 Estrogen receptor positive status [ER+]: Secondary | ICD-10-CM | POA: Diagnosis not present

## 2020-04-02 DIAGNOSIS — Z5111 Encounter for antineoplastic chemotherapy: Secondary | ICD-10-CM | POA: Diagnosis present

## 2020-04-02 DIAGNOSIS — C50211 Malignant neoplasm of upper-inner quadrant of right female breast: Secondary | ICD-10-CM | POA: Insufficient documentation

## 2020-04-02 DIAGNOSIS — Z5112 Encounter for antineoplastic immunotherapy: Secondary | ICD-10-CM | POA: Insufficient documentation

## 2020-04-02 DIAGNOSIS — F17210 Nicotine dependence, cigarettes, uncomplicated: Secondary | ICD-10-CM | POA: Insufficient documentation

## 2020-04-02 DIAGNOSIS — Z79899 Other long term (current) drug therapy: Secondary | ICD-10-CM | POA: Diagnosis not present

## 2020-04-02 DIAGNOSIS — I427 Cardiomyopathy due to drug and external agent: Secondary | ICD-10-CM

## 2020-04-02 MED ORDER — ONDANSETRON HCL 8 MG PO TABS: 8.0000 mg | ORAL_TABLET | Freq: Two times a day (BID) | ORAL | 1 refills | Status: DC | PRN

## 2020-04-02 MED ORDER — LORAZEPAM 0.5 MG PO TABS: 0.5000 mg | ORAL_TABLET | Freq: Every evening | ORAL | 0 refills | Status: DC | PRN

## 2020-04-02 MED ORDER — LIDOCAINE-PRILOCAINE 2.5-2.5 % EX CREA: TOPICAL_CREAM | CUTANEOUS | 3 refills | Status: DC

## 2020-04-02 MED ORDER — PROCHLORPERAZINE MALEATE 10 MG PO TABS: 10.0000 mg | ORAL_TABLET | Freq: Four times a day (QID) | ORAL | 1 refills | Status: DC | PRN

## 2020-04-02 NOTE — Progress Notes (Signed)
Per MD request orders for echocardiogram placed and scheduled.  Pt notified of apt date and time and verbalized understanding.

## 2020-04-02 NOTE — Progress Notes (Signed)
START OFF PATHWAY REGIMEN - Breast   [id number]:AC q21 Days C1-4 followed by Carboplatin AUC=6 D1 + Paclitaxel 80 mg/m2 D1,8,15 q21 Days C5-8:   A cycle is every 21 days:     Doxorubicin      Cyclophosphamide      Paclitaxel      Carboplatin   **Always confirm dose/schedule in your pharmacy ordering system**  [id number]:Pembrolizumab 200 mg IV D1 q21 Days:   A cycle is every 21 days:     Pembrolizumab   **Always confirm dose/schedule in your pharmacy ordering system**  Patient Characteristics: Preoperative or Nonsurgical Candidate (Clinical Staging), Neoadjuvant Therapy followed by Surgery, Invasive Disease, Chemotherapy, HER2 Negative/Unknown/Equivocal, ER Negative/Unknown, Platinum Therapy Indicated Therapeutic Status: Preoperative or Nonsurgical Candidate (Clinical Staging) AJCC M Category: cM0 AJCC Grade: G3 Breast Surgical Plan: Neoadjuvant Therapy followed by Surgery ER Status: Negative (-) AJCC 8 Stage Grouping: IIIB HER2 Status: Negative (-) AJCC T Category: cT2 AJCC N Category: cN1 PR Status: Negative (-) Type of Therapy: Platinum Therapy Indicated Intent of Therapy: Curative Intent, Discussed with Patient

## 2020-04-02 NOTE — Assessment & Plan Note (Signed)
01/2020: Palpable right breast mass growing since July 2021.  Mammogram revealed 2.2 cm right breast mass with a 0.8 cm satellite lesion.  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  Pathology and radiology counseling: Discussed with the patient, the details of pathology including the type of breast cancer,the clinical staging, the significance of ER, PR and HER-2/neu receptors and the implications for treatment. After reviewing the pathology in detail, we proceeded to discuss the different treatment options between surgery, radiation, chemotherapy, antiestrogen therapies.  Recommendation based on multidisciplinary tumor board: 1. Neoadjuvant chemotherapy with Adriamycin and Cytoxan pembrolizumab 4 followed by Taxol weekly 12 with carboplatin pembrolizumab every 3 weeks x4, followed by pembrolizumab maintenance to complete 1 year 2. Followed by breast conserving surgery with targeted axillary dissection 3. Followed by adjuvant radiation therapy  Chemotherapy Counseling: I discussed the risks and benefits of chemotherapy including the risks of nausea/ vomiting, risk of infection from low WBC count, fatigue due to chemo or anemia, bruising or bleeding due to low platelets, mouth sores, loss/ change in taste and decreased appetite. Liver and kidney function will be monitored through out chemotherapy as abnormalities in liver and kidney function may be a side effect of treatment.  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 Risk of permanent bone marrow dysfunction due to chemo were also discussed.  Plan: 1. Echocardiogram 2. Chemotherapy class 3. Breast MRI 4. CT chest abdomen pelvis (patient tells me that shes doing these in November)  Start chemotherapy next Wednesday

## 2020-04-02 NOTE — Telephone Encounter (Signed)
Received a new pt referral from dr. Marlou Starks for breast cancer. Sharon Fischer has been cld and scheduled to see Sharon Fischer today at 345pm. Pt aware to arrive 15 minutes early.

## 2020-04-02 NOTE — Progress Notes (Signed)
[place] CONSULT NOTE  Patient Care Team: Muse, Noel Journey., PA-C as PCP - General Dishmon, Garwin Brothers, RN as Oncology Nurse Navigator (Oncology)  CHIEF COMPLAINTS/PURPOSE OF CONSULTATION:  Newly diagnosed breast cancer  HISTORY OF PRESENTING ILLNESS:  Sharon Fischer 54 y.o. female is here because of recent diagnosis of right breast cancer.  She felt the lump in July and wait until August to see her primary care physician who then sent her for mammogram.  The mammogram revealed a 2.2 cm lump in the right breast and a 0.8 cm nodule as well.  She also had a nodule in the left breast measuring 1.6 cm.  She had an enlarged right axillary lymph node.  Which was also biopsied.  The pathology of the right breast came back as grade 3 IDC triple negative with a Ki-67 of 80% and the lymph node was positive.  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  She has seen Dr. Marlou Starks who recommended neoadjuvant chemotherapy and referred her over here. After the entire clinic visit she informed me that she was referred to [facility] for medical oncology consultation.  I was not aware of that initially. She informed me at the end of the visit that she would like to be treated at [facility].  I reviewed her records extensively and collaborated the history with the patient.  SUMMARY OF ONCOLOGIC HISTORY: Oncology History  Malignant neoplasm of upper-inner quadrant of right breast in female, estrogen receptor positive ([place])  01/2020 Initial Diagnosis   Palpable right breast mass growing since July 2021.  Mammogram revealed 2.2 cm right breast mass with a 0.8 cm satellite lesion.  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   03/31/2020 Cancer Staging   Staging form: Breast, AJCC 8th Edition - Clinical stage from 03/31/2020: Stage IIIB (cT2, cN1(f), cM0, G3, ER-, PR-,  HER2-) - Signed by Nicholas Lose, MD on 04/02/2020      MEDICAL HISTORY:  Past Medical History:  Diagnosis Date   Breast cancer ([place]) 03/2020    SURGICAL HISTORY: Past Surgical History:  Procedure Laterality Date   IR IMAGING GUIDED PORT INSERTION  03/30/2020    SOCIAL HISTORY: Social History   Socioeconomic History   Marital status: Single    Spouse name: Not on file   Number of children: Not on file   Years of education: Not on file   Highest education level: Not on file  Occupational History   Not on file  Tobacco Use   Smoking status: Current Every Day Smoker    Types: Cigarettes   Smokeless tobacco: Never Used   Tobacco comment: 7-8 cigs /day  Substance and Sexual Activity   Alcohol use: Never   Drug use: Never   Sexual activity: Not on file  Other Topics Concern   Not on file  Social History Narrative   Not on file   Social Determinants of Health   Financial Resource Strain:    Difficulty of Paying Living Expenses: Not on file  Food Insecurity:    Worried About Running Out of Food in the Last Year: Not on file   Ran Out of Food in the Last Year: Not on file  Transportation Needs:    Lack of Transportation (Medical): Not on file   Lack of Transportation (Non-Medical): Not on file  Physical Activity:  Days of Exercise per Week: Not on file   Minutes of Exercise per Session: Not on file  Stress:    Feeling of Stress : Not on file  Social Connections:    Frequency of Communication with Friends and Family: Not on file   Frequency of Social Gatherings with Friends and Family: Not on file   Attends Religious Services: Not on file   Active Member of Clubs or Organizations: Not on file   Attends Archivist Meetings: Not on file   Marital Status: Not on file  Intimate Partner Violence:    Fear of Current or Ex-Partner: Not on file   Emotionally Abused: Not on file   Physically Abused: Not on file   Sexually  Abused: Not on file    FAMILY HISTORY: No family history on file.  ALLERGIES:  has No Known Allergies.  MEDICATIONS:  Current Outpatient Medications  Medication Sig Dispense Refill   atorvastatin (LIPITOR) 40 MG tablet Take by mouth.     fluticasone (FLONASE) 50 MCG/ACT nasal spray Place into both nostrils.     lidocaine-prilocaine (EMLA) cream Apply to affected area once 30 g 3   lisinopril-hydrochlorothiazide (ZESTORETIC) 20-12.5 MG tablet Take 1 tablet by mouth daily.     LORazepam (ATIVAN) 0.5 MG tablet Take 1 tablet (0.5 mg total) by mouth at bedtime as needed for sleep. 30 tablet 0   ondansetron (ZOFRAN) 8 MG tablet Take 1 tablet (8 mg total) by mouth 2 (two) times daily as needed. Start on the third day after chemotherapy. 30 tablet 1   prochlorperazine (COMPAZINE) 10 MG tablet Take 1 tablet (10 mg total) by mouth every 6 (six) hours as needed (Nausea or vomiting). 30 tablet 1   No current facility-administered medications for this visit.    REVIEW OF SYSTEMS:   Palpable lump in the right breast  PHYSICAL EXAMINATION: ECOG PERFORMANCE STATUS: 1 - Symptomatic but completely ambulatory  Vitals:   04/02/20 1511  BP: 126/69  Pulse: 80  Resp: 18  Temp: 98.2 F (36.8 C)  SpO2: 100%   Filed Weights   04/02/20 1511  Weight: 184 lb 8 oz (83.7 kg)      LABORATORY DATA:  I have reviewed the data as listed Lab Results  Component Value Date   WBC 5.4 02/05/2007   HGB 12.2 10/18/2010   HCT 36.0 10/18/2010   MCV 80.9 02/05/2007   PLT 251 02/05/2007   Lab Results  Component Value Date   NA 140 10/18/2010   K 3.4 (L) 10/18/2010   CL 109 10/18/2010   CO2 25 02/05/2007    RADIOGRAPHIC STUDIES: I have personally reviewed the radiological reports and agreed with the findings in the report.  ASSESSMENT AND PLAN:  Malignant neoplasm of upper-inner quadrant of right breast in female, estrogen receptor positive ([place]) 01/2020: Palpable right breast mass growing  since July 2021.  Mammogram revealed 2.2 cm right breast mass with a 0.8 cm satellite lesion.  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  Pathology and radiology counseling: Discussed with the patient, the details of pathology including the type of breast cancer,the clinical staging, the significance of ER, PR and HER-2/neu receptors and the implications for treatment. After reviewing the pathology in detail, we proceeded to discuss the different treatment options between surgery, radiation, chemotherapy, antiestrogen therapies.  Recommendation based on multidisciplinary tumor board:  1. Neoadjuvant chemotherapy with Adriamycin and Cytoxan pembrolizumab 4 followed by Taxol weekly 12 with carboplatin pembrolizumab every 3 weeks x4, followed by pembrolizumab maintenance to complete 1 year 2. Followed by breast conserving surgery with targeted axillary dissection 3. Followed by adjuvant radiation therapy  Chemotherapy Counseling: I discussed the risks and benefits of chemotherapy including the risks of nausea/ vomiting, risk of infection from low WBC count, fatigue due to chemo or anemia, bruising or bleeding due to low platelets, mouth sores, loss/ change in taste and decreased appetite. Liver and kidney function will be monitored through out chemotherapy as abnormalities in liver and kidney function may be a side effect of treatment.  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 Risk of permanent bone marrow dysfunction due to chemo were also discussed.  Plan: 1. Echocardiogram 2. Chemotherapy class 3. Breast MRI 4. CT chest abdomen pelvis (patient tells me that shes doing these in November)  Start chemotherapy next Wednesday    All questions were answered. The patient knows to call the clinic with any problems, questions  or concerns.    Harriette Ohara, MD 04/02/20

## 2020-04-03 ENCOUNTER — Telehealth: Payer: Self-pay | Admitting: *Deleted

## 2020-04-03 ENCOUNTER — Other Ambulatory Visit: Payer: Self-pay | Admitting: Hematology and Oncology

## 2020-04-03 ENCOUNTER — Encounter: Payer: Self-pay | Admitting: Licensed Clinical Social Worker

## 2020-04-03 ENCOUNTER — Encounter: Payer: Self-pay | Admitting: *Deleted

## 2020-04-03 NOTE — Progress Notes (Signed)
Pharmacist Chemotherapy Monitoring - Initial Assessment    Anticipated start date: 04/09/20  Regimen:   Are orders appropriate based on the patients diagnosis, regimen, and cycle? Yes  Does the plan date match the patients scheduled date? Yes  Is the sequencing of drugs appropriate? Yes  Are the premedications appropriate for the patients regimen? Yes  Prior Authorization for treatment is: Uninsured o If applicable, is the correct biosimilar selected based on the patient's insurance? yes  Organ Function and Labs:  Are dose adjustments needed based on the patient's renal function, hepatic function, or hematologic function? Yes  Are appropriate labs ordered prior to the start of patient's treatment? Yes  Other organ system assessment, if indicated: anthracyclines: Echo/ MUGA  The following baseline labs, if indicated, have been ordered: N/A  Dose Assessment:  Are the drug doses appropriate? Yes  Are the following correct: o Drug concentrations Yes o IV fluid compatible with drug Yes o Administration routes Yes o Timing of therapy No  If applicable, does the patient have documented access for treatment and/or plans for port-a-cath placement? yes  If applicable, have lifetime cumulative doses been properly documented and assessed? not applicable Lifetime Dose Tracking  No doses have been documented on this patient for the following tracked chemicals: Doxorubicin, Epirubicin, Idarubicin, Daunorubicin, Mitoxantrone, Bleomycin, Oxaliplatin, Carboplatin, Liposomal Doxorubicin  o   Toxicity Monitoring/Prevention:  The patient has the following take home antiemetics prescribed: Prochlorperazine  The patient has the following take home medications prescribed: N/A  Medication allergies and previous infusion related reactions, if applicable, have been reviewed and addressed. Yes  The patient's current medication list has been assessed for drug-drug interactions with their  chemotherapy regimen. no significant drug-drug interactions were identified on review.  Order Review:  Are the treatment plan orders signed? Yes  Is the patient scheduled to see a provider prior to their treatment? Yes  I verify that I have reviewed each item in the above checklist and answered each question accordingly.  Sharon Fischer 04/03/2020 2:49 PM

## 2020-04-03 NOTE — Progress Notes (Signed)
The following Medication is approved for drug replacement program by [employer] for Udenyca . The enrollment period is from 04/03/2020 to 04/03/2021. Reason for Assistance: Self Pay. ID: [id number] First DOS covered is 04/11/2020.

## 2020-04-03 NOTE — Progress Notes (Signed)
.  The following biosimilar Udenyca ([id number]) has been selected for use in this patient.  Self pay attempting assistance.  Sharon Fischer

## 2020-04-03 NOTE — Telephone Encounter (Signed)
Spoke to pt, provided navigation resources and contact information. Denies questions or concerns regarding dx or treatment care plan. Confirmed future appts. Encourage pt to call with needs. Received verbal understanding. ?

## 2020-04-03 NOTE — Progress Notes (Signed)
[place] Psychosocial Distress Screening Clinical Social Work  Clinical Social Work was referred by distress screening protocol.  The patient scored a 5 on the Psychosocial Distress Thermometer which indicates moderate distress. Clinical Social Worker attempted to contact patient by phone to assess for distress and other psychosocial needs. No answer. Left VM with direct contact information.  ONCBCN DISTRESS SCREENING 03/31/2020  Screening Type Initial Screening  Distress experienced in past week (1-10) 5  Physical Problem type Pain;Nausea/vomiting  Other Contact via phone [phone]     Clinical Social Worker follow up needed: Yes.  CSW will attempt to see patient during appt next week to introduce services.   Sharon Fischer E, LCSW

## 2020-04-05 ENCOUNTER — Ambulatory Visit
Admission: RE | Admit: 2020-04-05 | Discharge: 2020-04-05 | Disposition: A | Discharge status: Home / Self Care | Payer: Self-pay | Source: Ambulatory Visit | Attending: Hematology and Oncology | Admitting: Hematology and Oncology

## 2020-04-05 ENCOUNTER — Other Ambulatory Visit: Payer: Self-pay

## 2020-04-05 DIAGNOSIS — C50211 Malignant neoplasm of upper-inner quadrant of right female breast: Secondary | ICD-10-CM

## 2020-04-05 DIAGNOSIS — Z170 Estrogen receptor positive status [ER+]: Secondary | ICD-10-CM

## 2020-04-05 MED ORDER — GADOBUTROL 1 MMOL/ML IV SOLN
8.0000 mL | Freq: Once | INTRAVENOUS | Status: AC | PRN
  Administered 2020-04-05: 8 mL via INTRAVENOUS

## 2020-04-06 ENCOUNTER — Other Ambulatory Visit: Payer: Self-pay | Admitting: *Deleted

## 2020-04-06 ENCOUNTER — Encounter: Payer: Self-pay | Admitting: *Deleted

## 2020-04-06 ENCOUNTER — Telehealth: Payer: Self-pay | Admitting: *Deleted

## 2020-04-06 ENCOUNTER — Ambulatory Visit (HOSPITAL_COMMUNITY)
Admission: RE | Admit: 2020-04-06 | Discharge: 2020-04-06 | Disposition: A | Discharge status: Home / Self Care | Payer: Medicaid Other | Source: Ambulatory Visit | Attending: Hematology and Oncology | Admitting: Hematology and Oncology

## 2020-04-06 DIAGNOSIS — Z170 Estrogen receptor positive status [ER+]: Secondary | ICD-10-CM

## 2020-04-06 DIAGNOSIS — I427 Cardiomyopathy due to drug and external agent: Secondary | ICD-10-CM | POA: Diagnosis not present

## 2020-04-06 DIAGNOSIS — C50211 Malignant neoplasm of upper-inner quadrant of right female breast: Secondary | ICD-10-CM

## 2020-04-06 DIAGNOSIS — Z01818 Encounter for other preprocedural examination: Secondary | ICD-10-CM | POA: Insufficient documentation

## 2020-04-06 DIAGNOSIS — C50919 Malignant neoplasm of unspecified site of unspecified female breast: Secondary | ICD-10-CM | POA: Diagnosis not present

## 2020-04-06 DIAGNOSIS — F17200 Nicotine dependence, unspecified, uncomplicated: Secondary | ICD-10-CM | POA: Diagnosis not present

## 2020-04-06 LAB — ECHOCARDIOGRAM COMPLETE
Area-P 1/2: 4.29 cm2
S' Lateral: 2.6 cm

## 2020-04-06 NOTE — Telephone Encounter (Signed)
Called pt and discussed breast MRI results. Discussed recommendations for bilateral MRI breast bx. Received verbal understanding. Denies further questions or needs. Orders placed.

## 2020-04-06 NOTE — Progress Notes (Signed)
  Echocardiogram 2D Echocardiogram has been performed.  Darlina Sicilian M 04/06/2020, 8:50 AM

## 2020-04-07 ENCOUNTER — Other Ambulatory Visit: Payer: Self-pay

## 2020-04-07 ENCOUNTER — Inpatient Hospital Stay: Payer: Medicaid Other

## 2020-04-07 ENCOUNTER — Encounter: Payer: Self-pay | Admitting: Hematology and Oncology

## 2020-04-07 ENCOUNTER — Other Ambulatory Visit: Payer: Self-pay | Admitting: Hematology and Oncology

## 2020-04-07 DIAGNOSIS — C50211 Malignant neoplasm of upper-inner quadrant of right female breast: Secondary | ICD-10-CM

## 2020-04-07 DIAGNOSIS — Z170 Estrogen receptor positive status [ER+]: Secondary | ICD-10-CM

## 2020-04-07 NOTE — Progress Notes (Signed)
Called pt to introduce myself as her Arboriculturist.  Pt is uninsured so copay assistance isn't available for her.  Juan Quam is working on possible drug replacement and pt has been referred to [facility] thru the hospital side to apply for Medicaid.  I informed her of the [employer], went over what it covers and gave her the income requirement.  Pt would like to apply so Mickel Baas will give me a copy of her income on 04/08/20.  If approved I will give pt an expense sheet and my card for any questions or concerns she may have in the future.

## 2020-04-07 NOTE — Progress Notes (Signed)
Patient Care Team: Muse, Noel Journey., PA-C as PCP - General Dishmon, Garwin Brothers, RN as Oncology Nurse Navigator (Oncology) Mauro Kaufmann, RN as Oncology Nurse Navigator Rockwell Germany, RN as Oncology Nurse Navigator  DIAGNOSIS:    ICD-10-CM   1. Malignant neoplasm of upper-inner quadrant of right breast in female, estrogen receptor positive ([place])  C50.211    Z17.0     SUMMARY OF ONCOLOGIC HISTORY: Oncology History  Malignant neoplasm of upper-inner quadrant of right breast in female, estrogen receptor positive ([place])  01/2020 Initial Diagnosis   Palpable right breast mass growing since July 2021.  Mammogram revealed 2.2 cm right breast mass with a 0.8 cm satellite lesion.  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   03/31/2020 Cancer Staging   Staging form: Breast, AJCC 8th Edition - Clinical stage from 03/31/2020: Stage IIIB (cT2, cN1(f), cM0, G3, ER-, PR-, HER2-) - Signed by Nicholas Lose, MD on 04/02/2020   04/09/2020 -  Chemotherapy   The patient had DOXOrubicin (ADRIAMYCIN) chemo injection 120 mg, 60 mg/m2 = 120 mg, Intravenous,  Once, 0 of 4 cycles palonosetron (ALOXI) injection 0.25 mg, 0.25 mg, Intravenous,  Once, 0 of 8 cycles pegfilgrastim-cbqv (UDENYCA) injection 6 mg, 6 mg, Subcutaneous, Once, 0 of 4 cycles CARBOplatin (PARAPLATIN) 700 mg in sodium chloride 0.9 % 250 mL chemo infusion, 700 mg (100 % of original dose 700 mg), Intravenous,  Once, 0 of 4 cycles Dose modification: 700 mg (original dose 700 mg, Cycle 5) cyclophosphamide (CYTOXAN) 1,200 mg in sodium chloride 0.9 % 250 mL chemo infusion, 600 mg/m2 = 1,200 mg, Intravenous,  Once, 0 of 4 cycles PACLitaxel (TAXOL) 162 mg in sodium chloride 0.9 % 250 mL chemo infusion (</= 89m/m2), 80 mg/m2 = 162 mg, Intravenous,  Once, 0 of 4 cycles fosaprepitant (EMEND) 150 mg in sodium chloride 0.9 % 145 mL  IVPB, 150 mg, Intravenous,  Once, 0 of 8 cycles pembrolizumab (KEYTRUDA) 200 mg in sodium chloride 0.9 % 50 mL chemo infusion, 200 mg (100 % of original dose 200 mg), Intravenous, Once, 0 of 8 cycles Dose modification: 200 mg (original dose 200 mg, Cycle 1, Reason: Provider Judgment)  for chemotherapy treatment.      CHIEF COMPLIANT: Cycle 1 Adriamycin, Cytoxan, and Keytruda will start 04/09/2020  INTERVAL HISTORY: Sharon Fischer a 54y.o. with above-mentioned history of right breast cancer currently on neoadjuvant chemotherapy with Adriamycin, Cytoxan, and Keytruda. Echo on 04/06/20 showed an ejection fraction of 60-65%. She presents to the clinic today for cycle 1.    She will actually start chemotherapy tomorrow. She is slightly uncomfortable at the port site but otherwise doing quite well.  ALLERGIES:  has No Known Allergies.  MEDICATIONS:  Current Outpatient Medications  Medication Sig Dispense Refill  . atorvastatin (LIPITOR) 40 MG tablet Take by mouth.    . fluticasone (FLONASE) 50 MCG/ACT nasal spray Place into both nostrils.    .Marland Kitchenlidocaine-prilocaine (EMLA) cream Apply to affected area once 30 g 3  . lisinopril-hydrochlorothiazide (ZESTORETIC) 20-12.5 MG tablet Take 1 tablet by mouth daily.    .Marland KitchenLORazepam (ATIVAN) 0.5 MG tablet Take 1 tablet (0.5 mg total) by mouth at bedtime as needed for sleep. 30 tablet 0  . ondansetron (ZOFRAN) 8 MG tablet Take 1 tablet (8 mg total) by mouth 2 (two) times daily as needed. Start on the third  day after chemotherapy. 30 tablet 1  . prochlorperazine (COMPAZINE) 10 MG tablet Take 1 tablet (10 mg total) by mouth every 6 (six) hours as needed (Nausea or vomiting). 30 tablet 1   No current facility-administered medications for this visit.    PHYSICAL EXAMINATION: ECOG PERFORMANCE STATUS: 1 - Symptomatic but completely ambulatory  There were no vitals filed for this visit. There were no vitals filed for this visit.   LABORATORY DATA:  I have  reviewed the data as listed CMP Latest Ref Rng & Units 10/18/2010 02/05/2007  Glucose 70 - 99 mg/dL 89 84  BUN 6 - 23 mg/dL 6 4(L)  Creatinine 0.40 - 1.20 mg/dL 0.70 0.81  Sodium 135 - 145 mEq/L 140 136  Potassium 3.5 - 5.1 mEq/L 3.4(L) 4.0  Chloride 96 - 112 mEq/L 109 106  CO2 - - 25  Calcium - - 9.0  Total Protein - - 7.2  Total Bilirubin - - 0.4  Alkaline Phos - - 91  AST - - 28  ALT - - 21    Lab Results  Component Value Date   WBC 5.2 04/08/2020   HGB 12.9 04/08/2020   HCT 39.1 04/08/2020   MCV 85.0 04/08/2020   PLT 223 04/08/2020   NEUTROABS 2.7 04/08/2020    ASSESSMENT & PLAN:  Malignant neoplasm of upper-inner quadrant of right breast in female, estrogen receptor positive ([place]) 01/2020: Palpable right breast mass growing since July 2021.  Mammogram revealed 2.2 cm right breast mass with a 0.8 cm satellite lesion.  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  Treatment plan  1. Neoadjuvant chemotherapy with Adriamycin and Cytoxan pembrolizumab 4 followed by Taxol weekly 12 with carboplatin pembrolizumab every 3 weeks x4, followed by pembrolizumab maintenance to complete 1 year 2. Followed by breast conserving surgery with targeted axillary dissection 3. Followed by adjuvant radiation therapy -------------------------------------------------------------------------------------------------------------------------------------- Current treatment: Cycle 1 day 1 Adriamycin, Cytoxan, pembrolizumab Labs reviewed, chemo education completed, chemo consent obtained, antiemetics reviewed Echocardiogram 04/06/2020: EF 65% Breast MRI 04/06/2020: Right breast: 2.8 cm mass, indeterminate 0.7 cm mass, bilobed mass in the right axilla 2.9 cm, left breast: 1 cm mass (papilloma), indeterminate 0.9 cm mass  Recommendation: Biopsy of the indeterminate masses in the right and  left breast. I did not prescribe dexamethasone because of immunotherapy.  Return to clinic in 1 week for toxicity check   No orders of the defined types were placed in this encounter.  The patient has a good understanding of the overall plan. she agrees with it. she will call with any problems that may develop before the next visit here.  Total time spent: 30 mins including face to face time and time spent for planning, charting and coordination of care  Nicholas Lose, MD 04/08/2020  I, Cloyde Reams Dorshimer, am acting as scribe for Dr. Nicholas Lose.  I have reviewed the above documentation for accuracy and completeness, and I agree with the above.

## 2020-04-07 NOTE — Assessment & Plan Note (Signed)
01/2020: Palpable right breast mass growing since July 2021.  Mammogram revealed 2.2 cm right breast mass with a 0.8 cm satellite lesion.  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  Treatment plan  1. Neoadjuvant chemotherapy with Adriamycin and Cytoxan pembrolizumab 4 followed by Taxol weekly 12 with carboplatin pembrolizumab every 3 weeks x4, followed by pembrolizumab maintenance to complete 1 year 2. Followed by breast conserving surgery with targeted axillary dissection 3. Followed by adjuvant radiation therapy -------------------------------------------------------------------------------------------------------------------------------------- Current treatment: Cycle 1 day 1 Adriamycin, Cytoxan, pembrolizumab Labs reviewed, chemo education completed, chemo consent obtained, antiemetics reviewed Echocardiogram 04/06/2020: EF 65%  Return to clinic in 1 week for toxicity check

## 2020-04-08 ENCOUNTER — Encounter: Payer: Self-pay | Admitting: Hematology and Oncology

## 2020-04-08 ENCOUNTER — Inpatient Hospital Stay (HOSPITAL_BASED_OUTPATIENT_CLINIC_OR_DEPARTMENT_OTHER): Payer: Medicaid Other | Admitting: Hematology and Oncology

## 2020-04-08 ENCOUNTER — Encounter: Payer: Self-pay | Admitting: *Deleted

## 2020-04-08 ENCOUNTER — Inpatient Hospital Stay: Payer: Medicaid Other

## 2020-04-08 ENCOUNTER — Other Ambulatory Visit: Payer: Self-pay

## 2020-04-08 DIAGNOSIS — Z170 Estrogen receptor positive status [ER+]: Secondary | ICD-10-CM

## 2020-04-08 DIAGNOSIS — C50211 Malignant neoplasm of upper-inner quadrant of right female breast: Secondary | ICD-10-CM

## 2020-04-08 DIAGNOSIS — Z5112 Encounter for antineoplastic immunotherapy: Secondary | ICD-10-CM | POA: Diagnosis not present

## 2020-04-08 LAB — CBC WITH DIFFERENTIAL (CANCER CENTER ONLY)
Abs Immature Granulocytes: 0.01 10*3/uL (ref 0.00–0.07)
Basophils Absolute: 0.1 10*3/uL (ref 0.0–0.1)
Basophils Relative: 1 %
Eosinophils Absolute: 0.1 10*3/uL (ref 0.0–0.5)
Eosinophils Relative: 3 %
HCT: 39.1 % (ref 36.0–46.0)
Hemoglobin: 12.9 g/dL (ref 12.0–15.0)
Immature Granulocytes: 0 %
Lymphocytes Relative: 37 %
Lymphs Abs: 1.9 10*3/uL (ref 0.7–4.0)
MCH: 28 pg (ref 26.0–34.0)
MCHC: 33 g/dL (ref 30.0–36.0)
MCV: 85 fL (ref 80.0–100.0)
Monocytes Absolute: 0.4 10*3/uL (ref 0.1–1.0)
Monocytes Relative: 7 %
Neutro Abs: 2.7 10*3/uL (ref 1.7–7.7)
Neutrophils Relative %: 52 %
Platelet Count: 223 10*3/uL (ref 150–400)
RBC: 4.6 MIL/uL (ref 3.87–5.11)
RDW: 14.3 % (ref 11.5–15.5)
WBC Count: 5.2 10*3/uL (ref 4.0–10.5)
nRBC: 0 % (ref 0.0–0.2)

## 2020-04-08 LAB — CMP (CANCER CENTER ONLY)
ALT: 19 U/L (ref 0–44)
AST: 19 U/L (ref 15–41)
Albumin: 3.9 g/dL (ref 3.5–5.0)
Alkaline Phosphatase: 134 U/L — ABNORMAL HIGH (ref 38–126)
Anion gap: 7 (ref 5–15)
BUN: 10 mg/dL (ref 6–20)
CO2: 28 mmol/L (ref 22–32)
Calcium: 9.7 mg/dL (ref 8.9–10.3)
Chloride: 105 mmol/L (ref 98–111)
Creatinine: 0.83 mg/dL (ref 0.44–1.00)
GFR, Estimated: >60 mL/min (ref >60)
Glucose, Bld: 93 mg/dL (ref 70–99)
Potassium: 3.8 mmol/L (ref 3.5–5.1)
Sodium: 140 mmol/L (ref 135–145)
Total Bilirubin: 0.6 mg/dL (ref 0.3–1.2)
Total Protein: 7.4 g/dL (ref 6.5–8.1)

## 2020-04-08 NOTE — Progress Notes (Signed)
Pt is approved for the $1000 [employer] grant.  

## 2020-04-09 ENCOUNTER — Encounter: Payer: Self-pay | Admitting: Licensed Clinical Social Worker

## 2020-04-09 ENCOUNTER — Encounter: Payer: Self-pay | Admitting: *Deleted

## 2020-04-09 ENCOUNTER — Inpatient Hospital Stay: Payer: Medicaid Other

## 2020-04-09 ENCOUNTER — Other Ambulatory Visit: Payer: Self-pay

## 2020-04-09 VITALS — BP 124/63 | HR 92 | Temp 98.3°F | Resp 16

## 2020-04-09 DIAGNOSIS — C50211 Malignant neoplasm of upper-inner quadrant of right female breast: Secondary | ICD-10-CM

## 2020-04-09 DIAGNOSIS — Z5112 Encounter for antineoplastic immunotherapy: Secondary | ICD-10-CM | POA: Diagnosis not present

## 2020-04-09 MED ORDER — SODIUM CHLORIDE 0.9 % IV SOLN
Freq: Once | INTRAVENOUS | Status: AC
  Filled 2020-04-09: qty 250

## 2020-04-09 MED ORDER — SODIUM CHLORIDE 0.9 % IV SOLN
600.0000 mg/m2 | Freq: Once | INTRAVENOUS | Status: AC
  Administered 2020-04-09: 1200 mg via INTRAVENOUS
  Filled 2020-04-09: qty 60

## 2020-04-09 MED ORDER — DOXORUBICIN HCL CHEMO IV INJECTION 2 MG/ML
60.0000 mg/m2 | Freq: Once | INTRAVENOUS | Status: AC
  Administered 2020-04-09: 120 mg via INTRAVENOUS
  Filled 2020-04-09: qty 60

## 2020-04-09 MED ORDER — SODIUM CHLORIDE 0.9 % IV SOLN
200.0000 mg | Freq: Once | INTRAVENOUS | Status: AC
  Administered 2020-04-09: 200 mg via INTRAVENOUS
  Filled 2020-04-09: qty 8

## 2020-04-09 MED ORDER — SODIUM CHLORIDE 0.9% FLUSH
10.0000 mL | INTRAVENOUS | Status: DC | PRN
  Administered 2020-04-09: 10 mL
  Filled 2020-04-09: qty 10

## 2020-04-09 MED ORDER — SODIUM CHLORIDE 0.9 % IV SOLN
150.0000 mg | Freq: Once | INTRAVENOUS | Status: AC
  Administered 2020-04-09: 150 mg via INTRAVENOUS
  Filled 2020-04-09: qty 150

## 2020-04-09 MED ORDER — HEPARIN SOD (PORK) LOCK FLUSH 100 UNIT/ML IV SOLN
500.0000 [IU] | Freq: Once | INTRAVENOUS | Status: AC | PRN
  Administered 2020-04-09: 500 [IU]
  Filled 2020-04-09: qty 5

## 2020-04-09 MED ORDER — PALONOSETRON HCL INJECTION 0.25 MG/5ML
0.2500 mg | Freq: Once | INTRAVENOUS | Status: AC
  Administered 2020-04-09: 0.25 mg via INTRAVENOUS

## 2020-04-09 MED ORDER — PALONOSETRON HCL INJECTION 0.25 MG/5ML
INTRAVENOUS | Status: AC
  Filled 2020-04-09: qty 5

## 2020-04-09 NOTE — Patient Instructions (Signed)
[facility] Discharge Instructions for Patients Receiving Chemotherapy  Today you received the following chemotherapy agents: Pembrolizumab (Keytruda), Doxorubicin, and Cyclophosphamide (Cytoxan)  To help prevent nausea and vomiting after your treatment, we encourage you to take your nausea medication  as prescribed.    If you develop nausea and vomiting that is not controlled by your nausea medication, call the clinic.   BELOW ARE SYMPTOMS THAT SHOULD BE REPORTED IMMEDIATELY:  *FEVER GREATER THAN 100.5 F  *CHILLS WITH OR WITHOUT FEVER  NAUSEA AND VOMITING THAT IS NOT CONTROLLED WITH YOUR NAUSEA MEDICATION  *UNUSUAL SHORTNESS OF BREATH  *UNUSUAL BRUISING OR BLEEDING  TENDERNESS IN MOUTH AND THROAT WITH OR WITHOUT PRESENCE OF ULCERS  *URINARY PROBLEMS  *BOWEL PROBLEMS  UNUSUAL RASH Items with * indicate a potential emergency and should be followed up as soon as possible.  Feel free to call the clinic should you have any questions or concerns. The clinic phone number is [phone].  Please show the [facility] at check-in to the Emergency Department and triage nurse.  Pembrolizumab injection What is this medicine? PEMBROLIZUMAB (pem broe liz ue mab) is a monoclonal antibody. It is used to treat certain types of cancer. This medicine may be used for other purposes; ask your health care provider or pharmacist if you have questions. COMMON BRAND NAME(S): Keytruda What should I tell my health care provider before I take this medicine? They need to know if you have any of these conditions:  diabetes  immune system problems  inflammatory bowel disease  liver disease  lung or breathing disease  lupus  received or scheduled to receive an organ transplant or a stem-cell transplant that uses donor stem cells  an unusual or allergic reaction to pembrolizumab, other medicines, foods, dyes, or preservatives  pregnant or trying to get  pregnant  breast-feeding How should I use this medicine? This medicine is for infusion into a vein. It is given by a health care professional in a hospital or clinic setting. A special MedGuide will be given to you before each treatment. Be sure to read this information carefully each time. Talk to your pediatrician regarding the use of this medicine in children. While this drug may be prescribed for children as young as 6 months for selected conditions, precautions do apply. Overdosage: If you think you have taken too much of this medicine contact a poison control center or emergency room at once. NOTE: This medicine is only for you. Do not share this medicine with others. What if I miss a dose? It is important not to miss your dose. Call your doctor or health care professional if you are unable to keep an appointment. What may interact with this medicine? Interactions have not been studied. Give your health care provider a list of all the medicines, herbs, non-prescription drugs, or dietary supplements you use. Also tell them if you smoke, drink alcohol, or use illegal drugs. Some items may interact with your medicine. This list may not describe all possible interactions. Give your health care provider a list of all the medicines, herbs, non-prescription drugs, or dietary supplements you use. Also tell them if you smoke, drink alcohol, or use illegal drugs. Some items may interact with your medicine. What should I watch for while using this medicine? Your condition will be monitored carefully while you are receiving this medicine. You may need blood work done while you are taking this medicine. Do not become pregnant while taking this medicine or for  4 months after stopping it. Women should inform their doctor if they wish to become pregnant or think they might be pregnant. There is a potential for serious side effects to an unborn child. Talk to your health care professional or pharmacist for  more information. Do not breast-feed an infant while taking this medicine or for 4 months after the last dose. What side effects may I notice from receiving this medicine? Side effects that you should report to your doctor or health care professional as soon as possible:  allergic reactions like skin rash, itching or hives, swelling of the face, lips, or tongue  bloody or black, tarry  breathing problems  changes in vision  chest pain  chills  confusion  constipation  cough  diarrhea  dizziness or feeling faint or lightheaded  fast or irregular heartbeat  fever  flushing  joint pain  low blood counts - this medicine may decrease the number of white blood cells, red blood cells and platelets. You may be at increased risk for infections and bleeding.  muscle pain  muscle weakness  pain, tingling, numbness in the hands or feet  persistent headache  redness, blistering, peeling or loosening of the skin, including inside the mouth  signs and symptoms of high blood sugar such as dizziness; dry mouth; dry skin; fruity breath; nausea; stomach pain; increased hunger or thirst; increased urination  signs and symptoms of kidney injury like trouble passing urine or change in the amount of urine  signs and symptoms of liver injury like dark urine, light-colored stools, loss of appetite, nausea, right upper belly pain, yellowing of the eyes or skin  sweating  swollen lymph nodes  weight loss Side effects that usually do not require medical attention (report to your doctor or health care professional if they continue or are bothersome):  decreased appetite  hair loss  muscle pain  tiredness This list may not describe all possible side effects. Call your doctor for medical advice about side effects. You may report side effects to FDA at 1-800-FDA-1088. Where should I keep my medicine? This drug is given in a hospital or clinic and will not be stored at home. NOTE:  This sheet is a summary. It may not cover all possible information. If you have questions about this medicine, talk to your doctor, pharmacist, or health care provider.  2020 Elsevier/Gold Standard (2019-04-05 18:07:58)  Doxorubicin injection What is this medicine? DOXORUBICIN (dox oh ROO bi sin) is a chemotherapy drug. It is used to treat many kinds of cancer like leukemia, lymphoma, neuroblastoma, sarcoma, and Wilms' tumor. It is also used to treat bladder cancer, breast cancer, lung cancer, ovarian cancer, stomach cancer, and thyroid cancer. This medicine may be used for other purposes; ask your health care provider or pharmacist if you have questions. COMMON BRAND NAME(S): Adriamycin, Adriamycin PFS, Adriamycin RDF, Rubex What should I tell my health care provider before I take this medicine? They need to know if you have any of these conditions:  heart disease  history of low blood counts caused by a medicine  liver disease  recent or ongoing radiation therapy  an unusual or allergic reaction to doxorubicin, other chemotherapy agents, other medicines, foods, dyes, or preservatives  pregnant or trying to get pregnant  breast-feeding How should I use this medicine? This drug is given as an infusion into a vein. It is administered in a hospital or clinic by a specially trained health care professional. If you have pain, swelling, burning or  any unusual feeling around the site of your injection, tell your health care professional right away. Talk to your pediatrician regarding the use of this medicine in children. Special care may be needed. Overdosage: If you think you have taken too much of this medicine contact a poison control center or emergency room at once. NOTE: This medicine is only for you. Do not share this medicine with others. What if I miss a dose? It is important not to miss your dose. Call your doctor or health care professional if you are unable to keep an  appointment. What may interact with this medicine? This medicine may interact with the following medications:  6-mercaptopurine  paclitaxel  phenytoin  St. John's Wort  trastuzumab  verapamil This list may not describe all possible interactions. Give your health care provider a list of all the medicines, herbs, non-prescription drugs, or dietary supplements you use. Also tell them if you smoke, drink alcohol, or use illegal drugs. Some items may interact with your medicine. What should I watch for while using this medicine? This drug may make you feel generally unwell. This is not uncommon, as chemotherapy can affect healthy cells as well as cancer cells. Report any side effects. Continue your course of treatment even though you feel ill unless your doctor tells you to stop. There is a maximum amount of this medicine you should receive throughout your life. The amount depends on the medical condition being treated and your overall health. Your doctor will watch how much of this medicine you receive in your lifetime. Tell your doctor if you have taken this medicine before. You may need blood work done while you are taking this medicine. Your urine may turn red for a few days after your dose. This is not blood. If your urine is dark or brown, call your doctor. In some cases, you may be given additional medicines to help with side effects. Follow all directions for their use. Call your doctor or health care professional for advice if you get a fever, chills or sore throat, or other symptoms of a cold or flu. Do not treat yourself. This drug decreases your body's ability to fight infections. Try to avoid being around people who are sick. This medicine may increase your risk to bruise or bleed. Call your doctor or health care professional if you notice any unusual bleeding. Talk to your doctor about your risk of cancer. You may be more at risk for certain types of cancers if you take this  medicine. Do not become pregnant while taking this medicine or for 6 months after stopping it. Women should inform their doctor if they wish to become pregnant or think they might be pregnant. Men should not father a child while taking this medicine and for 6 months after stopping it. There is a potential for serious side effects to an unborn child. Talk to your health care professional or pharmacist for more information. Do not breast-feed an infant while taking this medicine. This medicine has caused ovarian failure in some women and reduced sperm counts in some men This medicine may interfere with the ability to have a child. Talk with your doctor or health care professional if you are concerned about your fertility. This medicine may cause a decrease in Co-Enzyme Q-10. You should make sure that you get enough Co-Enzyme Q-10 while you are taking this medicine. Discuss the foods you eat and the vitamins you take with your health care professional. What side effects may I  notice from receiving this medicine? Side effects that you should report to your doctor or health care professional as soon as possible:  allergic reactions like skin rash, itching or hives, swelling of the face, lips, or tongue  breathing problems  chest pain  fast or irregular heartbeat  low blood counts - this medicine may decrease the number of white blood cells, red blood cells and platelets. You may be at increased risk for infections and bleeding.  pain, redness, or irritation at site where injected  signs of infection - fever or chills, cough, sore throat, pain or difficulty passing urine  signs of decreased platelets or bleeding - bruising, pinpoint red spots on the skin, black, tarry stools, blood in the urine  swelling of the ankles, feet, hands  tiredness  weakness Side effects that usually do not require medical attention (report to your doctor or health care professional if they continue or are  bothersome):  diarrhea  hair loss  mouth sores  nail discoloration or damage  nausea  red colored urine  vomiting This list may not describe all possible side effects. Call your doctor for medical advice about side effects. You may report side effects to FDA at 1-800-FDA-1088. Where should I keep my medicine? This drug is given in a hospital or clinic and will not be stored at home. NOTE: This sheet is a summary. It may not cover all possible information. If you have questions about this medicine, talk to your doctor, pharmacist, or health care provider.  2020 Elsevier/Gold Standard (2017-01-11 11:01:26)  Cyclophosphamide Injection What is this medicine? CYCLOPHOSPHAMIDE (sye kloe FOSS fa mide) is a chemotherapy drug. It slows the growth of cancer cells. This medicine is used to treat many types of cancer like lymphoma, myeloma, leukemia, breast cancer, and ovarian cancer, to name a few. This medicine may be used for other purposes; ask your health care provider or pharmacist if you have questions. COMMON BRAND NAME(S): Cytoxan, Neosar What should I tell my health care provider before I take this medicine? They need to know if you have any of these conditions:  heart disease  history of irregular heartbeat  infection  kidney disease  liver disease  low blood counts, like white cells, platelets, or red blood cells  on hemodialysis  recent or ongoing radiation therapy  scarring or thickening of the lungs  trouble passing urine  an unusual or allergic reaction to cyclophosphamide, other medicines, foods, dyes, or preservatives  pregnant or trying to get pregnant  breast-feeding How should I use this medicine? This drug is usually given as an injection into a vein or muscle or by infusion into a vein. It is administered in a hospital or clinic by a specially trained health care professional. Talk to your pediatrician regarding the use of this medicine in children.  Special care may be needed. Overdosage: If you think you have taken too much of this medicine contact a poison control center or emergency room at once. NOTE: This medicine is only for you. Do not share this medicine with others. What if I miss a dose? It is important not to miss your dose. Call your doctor or health care professional if you are unable to keep an appointment. What may interact with this medicine?  amphotericin B  azathioprine  certain antivirals for HIV or hepatitis  certain medicines for blood pressure, heart disease, irregular heart beat  certain medicines that treat or prevent blood clots like warfarin  certain other medicines for  cancer  cyclosporine  etanercept  indomethacin  medicines that relax muscles for surgery  medicines to increase blood counts  metronidazole This list may not describe all possible interactions. Give your health care provider a list of all the medicines, herbs, non-prescription drugs, or dietary supplements you use. Also tell them if you smoke, drink alcohol, or use illegal drugs. Some items may interact with your medicine. What should I watch for while using this medicine? Your condition will be monitored carefully while you are receiving this medicine. You may need blood work done while you are taking this medicine. Drink water or other fluids as directed. Urinate often, even at night. Some products may contain alcohol. Ask your health care professional if this medicine contains alcohol. Be sure to tell all health care professionals you are taking this medicine. Certain medicines, like metronidazole and disulfiram, can cause an unpleasant reaction when taken with alcohol. The reaction includes flushing, headache, nausea, vomiting, sweating, and increased thirst. The reaction can last from 30 minutes to several hours. Do not become pregnant while taking this medicine or for 1 year after stopping it. Women should inform their health  care professional if they wish to become pregnant or think they might be pregnant. Men should not father a child while taking this medicine and for 4 months after stopping it. There is potential for serious side effects to an unborn child. Talk to your health care professional for more information. Do not breast-feed an infant while taking this medicine or for 1 week after stopping it. This medicine has caused ovarian failure in some women. This medicine may make it more difficult to get pregnant. Talk to your health care professional if you are concerned about your fertility. This medicine has caused decreased sperm counts in some men. This may make it more difficult to father a child. Talk to your health care professional if you are concerned about your fertility. Call your health care professional for advice if you get a fever, chills, or sore throat, or other symptoms of a cold or flu. Do not treat yourself. This medicine decreases your body's ability to fight infections. Try to avoid being around people who are sick. Avoid taking medicines that contain aspirin, acetaminophen, ibuprofen, naproxen, or ketoprofen unless instructed by your health care professional. These medicines may hide a fever. Talk to your health care professional about your risk of cancer. You may be more at risk for certain types of cancer if you take this medicine. If you are going to need surgery or other procedure, tell your health care professional that you are using this medicine. Be careful brushing or flossing your teeth or using a toothpick because you may get an infection or bleed more easily. If you have any dental work done, tell your dentist you are receiving this medicine. What side effects may I notice from receiving this medicine? Side effects that you should report to your doctor or health care professional as soon as possible:  allergic reactions like skin rash, itching or hives, swelling of the face, lips, or  tongue  breathing problems  nausea, vomiting  signs and symptoms of bleeding such as bloody or black, tarry stools; red or dark brown urine; spitting up blood or brown material that looks like coffee grounds; red spots on the skin; unusual bruising or bleeding from the eyes, gums, or nose  signs and symptoms of heart failure like fast, irregular heartbeat, sudden weight gain; swelling of the ankles, feet, hands  signs  and symptoms of infection like fever; chills; cough; sore throat; pain or trouble passing urine  signs and symptoms of kidney injury like trouble passing urine or change in the amount of urine  signs and symptoms of liver injury like dark yellow or brown urine; general ill feeling or flu-like symptoms; light-colored stools; loss of appetite; nausea; right upper belly pain; unusually weak or tired; yellowing of the eyes or skin Side effects that usually do not require medical attention (report to your doctor or health care professional if they continue or are bothersome):  confusion  decreased hearing  diarrhea  facial flushing  hair loss  headache  loss of appetite  missed menstrual periods  signs and symptoms of low red blood cells or anemia such as unusually weak or tired; feeling faint or lightheaded; falls  skin discoloration This list may not describe all possible side effects. Call your doctor for medical advice about side effects. You may report side effects to FDA at 1-800-FDA-1088. Where should I keep my medicine? This drug is given in a hospital or clinic and will not be stored at home. NOTE: This sheet is a summary. It may not cover all possible information. If you have questions about this medicine, talk to your doctor, pharmacist, or health care provider.  2020 Elsevier/Gold Standard (2019-03-04 09:53:29)

## 2020-04-09 NOTE — Progress Notes (Signed)
[facility]  Initial Assessment   Sharon Fischer is a 54 y.o. year old female. Clinical Social Work was referred by distress screen for assessment of psychosocial needs.   SDOH (Social Determinants of Health) assessments performed: Yes SDOH Interventions     Most Recent Value  SDOH Interventions  Food Insecurity Interventions Other (Comment)  [Patient has SNAP benefits]  Transportation Interventions Other (Comment)  [Family helps]      Distress Screen completed: Yes   ONCBCN DISTRESS SCREENING 03/31/2020  Screening Type Initial Screening  Distress experienced in past week (1-10) 5  Physical Problem type Pain;Nausea/vomiting  Other Contact via phone [phone]    Family/Social Information:  . Housing Arrangement: patient lives with two sons, ages 47 & 2 . Family members/support persons in your life? Family- sons, sisters, nieces . Transportation concerns: no, family helps her get to appts as needed  . Employment: Working part time (hours reduced during [facility] now due to treatment). Income source: Employment. Has started application for Social Security Disability . Financial concerns: Yes, current concerns o Type of concern: Utilities and Rent/ mortgage . Food access concerns: no . Medication Concerns: Sharon Fischer working on possible drug replacement for pt while Medicaid application pending  . Services Currently in place:  Health visitor for [place] (rent & utility assistance). Qualified for [facility] Fischer. Medicaid application pending (sent through [place])  Coping/ Adjustment to diagnosis: . Patient understands treatment plan and what happens next? yes, slightly overwhelmed with number of phone calls and things to keep track of but adjusting and coping as well as possible right now. Good support from family. . Patient enjoys time with family/ friends . Current coping skills/ strengths: Capable of independent living and  Supportive family/friends    SUMMARY: Current SDOH Barriers:  . Financial constraints related to limited hours from Covid and now treatment . Lack of insurance  Clinical Social Work Clinical Goal(s):  Marland Kitchen Patient will continue to follow-up with disability and [facility] to provide appropriate paperwork  . Patient will look at information given today on cancer foundations and apply as desired  Interventions: . Discussed common feeling and emotions when being diagnosed with cancer, and the importance of support during treatment . Informed patient of the support team roles and support services at [facility] . Provided CSW contact information and encouraged patient to call with any questions or concerns . Provided patient with information about cancer foundations   Follow Up Plan: Patient will reach out to CSW with any further questions or needs. Patient will continue to follow-up on her applications for disability and Hope program Patient verbalizes understanding of plan: Yes    Sharon Fischer , LCSW

## 2020-04-10 ENCOUNTER — Telehealth: Payer: Self-pay | Admitting: *Deleted

## 2020-04-11 ENCOUNTER — Inpatient Hospital Stay: Payer: Medicaid Other

## 2020-04-11 ENCOUNTER — Other Ambulatory Visit: Payer: Self-pay

## 2020-04-11 VITALS — BP 117/54 | HR 84 | Temp 97.5°F | Resp 16

## 2020-04-11 DIAGNOSIS — Z5112 Encounter for antineoplastic immunotherapy: Secondary | ICD-10-CM | POA: Diagnosis not present

## 2020-04-11 DIAGNOSIS — C50211 Malignant neoplasm of upper-inner quadrant of right female breast: Secondary | ICD-10-CM

## 2020-04-11 MED ORDER — PEGFILGRASTIM-CBQV 6 MG/0.6ML ~~LOC~~ SOSY
6.0000 mg | PREFILLED_SYRINGE | Freq: Once | SUBCUTANEOUS | Status: AC
  Administered 2020-04-11: 6 mg via SUBCUTANEOUS

## 2020-04-11 NOTE — Patient Instructions (Signed)

## 2020-04-13 ENCOUNTER — Telehealth: Payer: Self-pay | Admitting: Hematology and Oncology

## 2020-04-13 NOTE — Telephone Encounter (Signed)
No 10/27 los, no changes made to pt schedule

## 2020-04-14 ENCOUNTER — Ambulatory Visit
Admission: RE | Admit: 2020-04-14 | Discharge: 2020-04-14 | Disposition: A | Discharge status: Home / Self Care | Payer: Self-pay | Source: Ambulatory Visit | Attending: Hematology and Oncology | Admitting: Hematology and Oncology

## 2020-04-14 ENCOUNTER — Other Ambulatory Visit: Payer: Self-pay | Admitting: General Practice

## 2020-04-14 ENCOUNTER — Other Ambulatory Visit: Payer: Self-pay

## 2020-04-14 DIAGNOSIS — C50211 Malignant neoplasm of upper-inner quadrant of right female breast: Secondary | ICD-10-CM

## 2020-04-14 DIAGNOSIS — Z170 Estrogen receptor positive status [ER+]: Secondary | ICD-10-CM

## 2020-04-14 MED ORDER — GADOBUTROL 1 MMOL/ML IV SOLN
10.0000 mL | Freq: Once | INTRAVENOUS | Status: AC | PRN
  Administered 2020-04-14: 10 mL via INTRAVENOUS

## 2020-04-14 NOTE — Progress Notes (Signed)
The following Medication: Beryle Flock has been approved thru [employer] as Risk manager. Enrollment period is 04/14/2020 to 04/12/2021.  Assistance ID: [id number] Medication is ordered prior to appointment to be on hand for treatment.  First DOS: 04/29/2020

## 2020-04-14 NOTE — Progress Notes (Signed)
Patient Care Team: Muse, Noel Journey., PA-C as PCP - General Dishmon, Garwin Brothers, RN as Oncology Nurse Navigator (Oncology) Mauro Kaufmann, RN as Oncology Nurse Navigator Rockwell Germany, RN as Oncology Nurse Navigator  DIAGNOSIS:    ICD-10-CM   1. Malignant neoplasm of upper-inner quadrant of right breast in female, estrogen receptor positive ([place])  C50.211    Z17.0     SUMMARY OF ONCOLOGIC HISTORY: Oncology History  Malignant neoplasm of upper-inner quadrant of right breast in female, estrogen receptor positive ([place])  01/2020 Initial Diagnosis   Palpable right breast mass growing since July 2021.  Mammogram revealed 2.2 cm right breast mass with a 0.8 cm satellite lesion.  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   03/31/2020 Cancer Staging   Staging form: Breast, AJCC 8th Edition - Clinical stage from 03/31/2020: Stage IIIB (cT2, cN1(f), cM0, G3, ER-, PR-, HER2-) - Signed by Nicholas Lose, MD on 04/02/2020   04/09/2020 -  Chemotherapy   The patient had DOXOrubicin (ADRIAMYCIN) chemo injection 120 mg, 60 mg/m2 = 120 mg, Intravenous,  Once, 1 of 4 cycles Administration: 120 mg (04/09/2020) palonosetron (ALOXI) injection 0.25 mg, 0.25 mg, Intravenous,  Once, 1 of 8 cycles Administration: 0.25 mg (04/09/2020) pegfilgrastim-cbqv (UDENYCA) injection 6 mg, 6 mg, Subcutaneous, Once, 1 of 4 cycles Administration: 6 mg (04/11/2020) CARBOplatin (PARAPLATIN) 700 mg in sodium chloride 0.9 % 250 mL chemo infusion, 700 mg (100 % of original dose 700 mg), Intravenous,  Once, 0 of 4 cycles Dose modification: 700 mg (original dose 700 mg, Cycle 5) cyclophosphamide (CYTOXAN) 1,200 mg in sodium chloride 0.9 % 250 mL chemo infusion, 600 mg/m2 = 1,200 mg, Intravenous,  Once, 1 of 4 cycles Administration: 1,200 mg (04/09/2020) PACLitaxel (TAXOL) 162 mg in sodium chloride 0.9 % 250 mL  chemo infusion (</= 56m/m2), 80 mg/m2 = 162 mg, Intravenous,  Once, 0 of 4 cycles fosaprepitant (EMEND) 150 mg in sodium chloride 0.9 % 145 mL IVPB, 150 mg, Intravenous,  Once, 1 of 8 cycles Administration: 150 mg (04/09/2020) pembrolizumab (KEYTRUDA) 200 mg in sodium chloride 0.9 % 50 mL chemo infusion, 200 mg (100 % of original dose 200 mg), Intravenous, Once, 1 of 8 cycles Dose modification: 200 mg (original dose 200 mg, Cycle 1, Reason: Provider Judgment) Administration: 200 mg (04/09/2020)  for chemotherapy treatment.      CHIEF COMPLIANT: Cycle 1 Day 7 Adriamycin, Cytoxan, and Keytruda   INTERVAL HISTORY: Sharon Droegeis a 54y.o. with above-mentioned history of right breast cancer currently on neoadjuvant chemotherapy with Adriamycin, Cytoxan, and Keytruda. She presents to the clinic today for a toxicity check following cycle 1.   She experience mild nausea after chemotherapy but otherwise no major side effects.  Did not throw up.  Did not have constipation or diarrhea.  Overall mild fatigue but otherwise able to function normally at home.  ALLERGIES:  has No Known Allergies.  MEDICATIONS:  Current Outpatient Medications  Medication Sig Dispense Refill  . atorvastatin (LIPITOR) 40 MG tablet Take by mouth.    . fluticasone (FLONASE) 50 MCG/ACT nasal spray Place into both nostrils.    .Marland Kitchenlidocaine-prilocaine (EMLA) cream Apply to affected area once 30 g 3  . lisinopril-hydrochlorothiazide (ZESTORETIC) 20-12.5 MG tablet Take 1 tablet by mouth daily.    .Marland KitchenLORazepam (ATIVAN) 0.5 MG tablet Take 1 tablet (0.5 mg total) by  mouth at bedtime as needed for sleep. 30 tablet 0  . ondansetron (ZOFRAN) 8 MG tablet Take 1 tablet (8 mg total) by mouth 2 (two) times daily as needed. Start on the third day after chemotherapy. 30 tablet 1  . prochlorperazine (COMPAZINE) 10 MG tablet Take 1 tablet (10 mg total) by mouth every 6 (six) hours as needed (Nausea or vomiting). 30 tablet 1   No current  facility-administered medications for this visit.    PHYSICAL EXAMINATION: ECOG PERFORMANCE STATUS: 1 - Symptomatic but completely ambulatory  Vitals:   04/15/20 1221  BP: 117/67  Pulse: 92  Resp: 17  Temp: (!) 97.3 F (36.3 C)  SpO2: 100%   Filed Weights   04/15/20 1221  Weight: 182 lb 1.6 oz (82.6 kg)    LABORATORY DATA:  I have reviewed the data as listed CMP Latest Ref Rng & Units 04/15/2020 04/08/2020 10/18/2010  Glucose 70 - 99 mg/dL 97 93 89  BUN 6 - 20 mg/dL _0 Creatinine 0.44 - 1.00 mg/dL 0.73 0.83 0.70  Sodium 135 - 145 mmol/L 140 140 140  Potassium 3.5 - 5.1 mmol/L 3.5 3.8 3.4(L)  Chloride 98 - 111 mmol/L 104 105 109  CO2 22 - 32 mmol/L 29 28 -  Calcium 8.9 - 10.3 mg/dL 9.0 9.7 -  Total Protein 6.5 - 8.1 g/dL 7.0 7.4 -  Total Bilirubin 0.3 - 1.2 mg/dL 0.6 0.6 -  Alkaline Phos 38 - 126 U/L 145(H) 134(H) -  AST 15 - 41 U/L 17 19 -  ALT 0 - 44 U/L 20 19 -    Lab Results  Component Value Date   WBC 2.3 (L) 04/15/2020   HGB 11.1 (L) 04/15/2020   HCT 33.5 (L) 04/15/2020   MCV 86.3 04/15/2020   PLT 132 (L) 04/15/2020   NEUTROABS 1.2 (L) 04/15/2020    ASSESSMENT & PLAN:  Malignant neoplasm of upper-inner quadrant of right breast in female, estrogen receptor positive ([place]) 01/2020:Palpable right breast mass growing since July 2021. Mammogram revealed 2.2 cm right breast mass with a 0.8 cm satellite lesion. 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  Treatment plan 1. Neoadjuvant chemotherapy with Adriamycin and Cytoxanpembrolizumab4 followed by Taxol weekly 12 with carboplatin pembrolizumabevery 3 weeks x4, followed by pembrolizumab maintenance to complete 1 year 2. Followed by breast conserving surgery with targeted axillary dissection 3. Followed by adjuvant radiation  therapy -------------------------------------------------------------------------------------------------------------------------------------- Current treatment: Cycle 1 day 8 Adriamycin, Cytoxan, pembrolizumab Echocardiogram 04/06/2020: EF 65% Breast MRI 04/06/2020: Right breast: 2.8 cm mass, indeterminate 0.7 cm mass, bilobed mass in the right axilla 2.9 cm, left breast: 1 cm mass (papilloma), indeterminate 0.9 cm mass  Chemo toxicities: 1.  Mild nausea 2. mild fatigue Monitoring closely for toxicities. Lab review revealed ANC of 1.2. We will keep the chemo doses the same for cycle 2.  Return to clinic in 2 weeks for cycle 2    No orders of the defined types were placed in this encounter.  The patient has a good understanding of the overall plan. she agrees with it. she will call with any problems that may develop before the next visit here.  Total time spent: 30 mins including face to face time and time spent for planning, charting and coordination of care  Nicholas Lose, MD 04/15/2020  I, Cloyde Reams Dorshimer, am acting as scribe for Dr. Loleta Dicker  [place].  I have reviewed the above documentation for accuracy and completeness, and I agree with the above.

## 2020-04-15 ENCOUNTER — Telehealth: Payer: Self-pay | Admitting: Genetic Counselor

## 2020-04-15 ENCOUNTER — Encounter: Payer: Self-pay | Admitting: *Deleted

## 2020-04-15 ENCOUNTER — Other Ambulatory Visit: Payer: Self-pay

## 2020-04-15 ENCOUNTER — Inpatient Hospital Stay: Payer: Medicaid Other | Attending: Hematology and Oncology | Admitting: Hematology and Oncology

## 2020-04-15 ENCOUNTER — Inpatient Hospital Stay: Payer: Medicaid Other

## 2020-04-15 DIAGNOSIS — Z5112 Encounter for antineoplastic immunotherapy: Secondary | ICD-10-CM | POA: Diagnosis present

## 2020-04-15 DIAGNOSIS — Z170 Estrogen receptor positive status [ER+]: Secondary | ICD-10-CM | POA: Diagnosis not present

## 2020-04-15 DIAGNOSIS — Z79899 Other long term (current) drug therapy: Secondary | ICD-10-CM | POA: Diagnosis not present

## 2020-04-15 DIAGNOSIS — C50211 Malignant neoplasm of upper-inner quadrant of right female breast: Secondary | ICD-10-CM

## 2020-04-15 DIAGNOSIS — Z5111 Encounter for antineoplastic chemotherapy: Secondary | ICD-10-CM | POA: Diagnosis present

## 2020-04-15 DIAGNOSIS — Z5189 Encounter for other specified aftercare: Secondary | ICD-10-CM | POA: Insufficient documentation

## 2020-04-15 LAB — CBC WITH DIFFERENTIAL (CANCER CENTER ONLY)
Abs Immature Granulocytes: 0 10*3/uL (ref 0.00–0.07)
Band Neutrophils: 1 %
Basophils Absolute: 0 10*3/uL (ref 0.0–0.1)
Basophils Relative: 1 %
Eosinophils Absolute: 0.1 10*3/uL (ref 0.0–0.5)
Eosinophils Relative: 4 %
HCT: 33.5 % — ABNORMAL LOW (ref 36.0–46.0)
Hemoglobin: 11.1 g/dL — ABNORMAL LOW (ref 12.0–15.0)
Lymphocytes Relative: 42 %
Lymphs Abs: 1 10*3/uL (ref 0.7–4.0)
MCH: 28.6 pg (ref 26.0–34.0)
MCHC: 33.1 g/dL (ref 30.0–36.0)
MCV: 86.3 fL (ref 80.0–100.0)
Metamyelocytes Relative: 1 %
Monocytes Absolute: 0 10*3/uL — ABNORMAL LOW (ref 0.1–1.0)
Monocytes Relative: 1 %
Neutro Abs: 1.2 10*3/uL — ABNORMAL LOW (ref 1.7–7.7)
Neutrophils Relative %: 50 %
Platelet Count: 132 10*3/uL — ABNORMAL LOW (ref 150–400)
RBC: 3.88 MIL/uL (ref 3.87–5.11)
RDW: 13.9 % (ref 11.5–15.5)
WBC Count: 2.3 10*3/uL — ABNORMAL LOW (ref 4.0–10.5)
nRBC: 0 % (ref 0.0–0.2)

## 2020-04-15 LAB — CMP (CANCER CENTER ONLY)
ALT: 20 U/L (ref 0–44)
AST: 17 U/L (ref 15–41)
Albumin: 3.6 g/dL (ref 3.5–5.0)
Alkaline Phosphatase: 145 U/L — ABNORMAL HIGH (ref 38–126)
Anion gap: 7 (ref 5–15)
BUN: 11 mg/dL (ref 6–20)
CO2: 29 mmol/L (ref 22–32)
Calcium: 9 mg/dL (ref 8.9–10.3)
Chloride: 104 mmol/L (ref 98–111)
Creatinine: 0.73 mg/dL (ref 0.44–1.00)
GFR, Estimated: >60 mL/min (ref >60)
Glucose, Bld: 97 mg/dL (ref 70–99)
Potassium: 3.5 mmol/L (ref 3.5–5.1)
Sodium: 140 mmol/L (ref 135–145)
Total Bilirubin: 0.6 mg/dL (ref 0.3–1.2)
Total Protein: 7 g/dL (ref 6.5–8.1)

## 2020-04-15 NOTE — Assessment & Plan Note (Signed)
01/2020:Palpable right breast mass growing since July 2021. Mammogram revealed 2.2 cm right breast mass with a 0.8 cm satellite lesion. 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  Treatment plan 1. Neoadjuvant chemotherapy with Adriamycin and Cytoxanpembrolizumab4 followed by Taxol weekly 12 with carboplatin pembrolizumabevery 3 weeks x4, followed by pembrolizumab maintenance to complete 1 year 2. Followed by breast conserving surgery with targeted axillary dissection 3. Followed by adjuvant radiation therapy -------------------------------------------------------------------------------------------------------------------------------------- Current treatment: Cycle 1 day 8 Adriamycin, Cytoxan, pembrolizumab Echocardiogram 04/06/2020: EF 65% Breast MRI 04/06/2020: Right breast: 2.8 cm mass, indeterminate 0.7 cm mass, bilobed mass in the right axilla 2.9 cm, left breast: 1 cm mass (papilloma), indeterminate 0.9 cm mass  Chemo toxicities:    Return to clinic in 2 weeks for cycle 2 

## 2020-04-15 NOTE — Telephone Encounter (Signed)
Sharon Fischer is unable to come in on 11/9 for her genetic counseling appointment. We have changed her appointment to be a video visit at the same day and time (11/9 at 1pm).

## 2020-04-17 ENCOUNTER — Telehealth: Payer: Self-pay | Admitting: Hematology and Oncology

## 2020-04-17 NOTE — Telephone Encounter (Signed)
No 1/13 los, no changes made to pt schedule  

## 2020-04-21 ENCOUNTER — Other Ambulatory Visit: Payer: Self-pay

## 2020-04-21 ENCOUNTER — Ambulatory Visit (HOSPITAL_BASED_OUTPATIENT_CLINIC_OR_DEPARTMENT_OTHER): Payer: Medicaid Other | Admitting: Genetic Counselor

## 2020-04-21 DIAGNOSIS — C50211 Malignant neoplasm of upper-inner quadrant of right female breast: Secondary | ICD-10-CM

## 2020-04-21 DIAGNOSIS — Z800 Family history of malignant neoplasm of digestive organs: Secondary | ICD-10-CM

## 2020-04-21 DIAGNOSIS — Z170 Estrogen receptor positive status [ER+]: Secondary | ICD-10-CM

## 2020-04-22 ENCOUNTER — Telehealth: Payer: Self-pay | Admitting: Genetic Counselor

## 2020-04-22 ENCOUNTER — Encounter: Payer: Self-pay | Admitting: Genetic Counselor

## 2020-04-22 DIAGNOSIS — Z800 Family history of malignant neoplasm of digestive organs: Secondary | ICD-10-CM | POA: Insufficient documentation

## 2020-04-22 NOTE — Progress Notes (Signed)
REFERRING PROVIDER: Nicholas Lose, MD Sharon Fischer,  [place] [id number]  PRIMARY PROVIDER:  Raiford Simmonds., PA-C  PRIMARY REASON FOR VISIT:  1. Malignant neoplasm of upper-inner quadrant of right breast in female, estrogen receptor positive ([place])   2. Family history of throat cancer      I connected with Sharon Fischer on 04/21/2020 at 1 pm EDT by video conference and verified that I am speaking with the correct person using two identifiers.   Patient location: Home Provider location: [facility] office  HISTORY OF PRESENT ILLNESS:   Sharon Fischer, a 54 y.o. female, was seen for a [facility] cancer genetics consultation at the request of Dr. Lindi Adie due to a personal and family history of cancer.  Sharon Fischer presents to clinic today to discuss the possibility of a hereditary predisposition to cancer, genetic testing, and to further clarify her future cancer risks, as well as potential cancer risks for family members.   In August of 2021, at the age of 21, Sharon Fischer was diagnosed with triple negative invasive ductal carcinoma of the right breast. The treatment plan includes chemotherapy, surgery, and radiation therapy.   CANCER HISTORY:  Oncology History  Malignant neoplasm of upper-inner quadrant of right breast in female, estrogen receptor positive ([place])  01/2020 Initial Diagnosis   Palpable right breast mass growing since July 2021.  Mammogram revealed 2.2 cm right breast mass with a 0.8 cm satellite lesion.  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   03/31/2020 Cancer Staging   Staging form: Breast, AJCC 8th Edition - Clinical stage from 03/31/2020: Stage IIIB (cT2, cN1(f), cM0, G3, ER-, PR-, HER2-) - Signed by Nicholas Lose, MD on 04/02/2020   04/09/2020 -  Chemotherapy   The patient had DOXOrubicin (ADRIAMYCIN) chemo injection 120 mg, 60 mg/m2 = 120  mg, Intravenous,  Once, 1 of 4 cycles Administration: 120 mg (04/09/2020) palonosetron (ALOXI) injection 0.25 mg, 0.25 mg, Intravenous,  Once, 1 of 8 cycles Administration: 0.25 mg (04/09/2020) pegfilgrastim-cbqv (UDENYCA) injection 6 mg, 6 mg, Subcutaneous, Once, 1 of 4 cycles Administration: 6 mg (04/11/2020) CARBOplatin (PARAPLATIN) 700 mg in sodium chloride 0.9 % 250 mL chemo infusion, 700 mg (100 % of original dose 700 mg), Intravenous,  Once, 0 of 4 cycles Dose modification: 700 mg (original dose 700 mg, Cycle 5) cyclophosphamide (CYTOXAN) 1,200 mg in sodium chloride 0.9 % 250 mL chemo infusion, 600 mg/m2 = 1,200 mg, Intravenous,  Once, 1 of 4 cycles Administration: 1,200 mg (04/09/2020) PACLitaxel (TAXOL) 162 mg in sodium chloride 0.9 % 250 mL chemo infusion (</= 66m/m2), 80 mg/m2 = 162 mg, Intravenous,  Once, 0 of 4 cycles fosaprepitant (EMEND) 150 mg in sodium chloride 0.9 % 145 mL IVPB, 150 mg, Intravenous,  Once, 1 of 8 cycles Administration: 150 mg (04/09/2020) pembrolizumab (KEYTRUDA) 200 mg in sodium chloride 0.9 % 50 mL chemo infusion, 200 mg (100 % of original dose 200 mg), Intravenous, Once, 1 of 8 cycles Dose modification: 200 mg (original dose 200 mg, Cycle 1, Reason: Provider Judgment) Administration: 200 mg (04/09/2020)  for chemotherapy treatment.      RISK FACTORS:  Menarche was at age 54  First live birth at age 54  OCP use for more than 5 years.  Ovaries intact: yes.  Hysterectomy: no.  Menopausal status: postmenopausal.  HRT use: 0 years. Colonoscopy: no. Mammogram within the  last year: yes. Any excessive radiation exposure in the past: no   Past Medical History:  Diagnosis Date  . Breast cancer ([place]) 03/2020  . Family history of throat cancer     Past Surgical History:  Procedure Laterality Date  . IR IMAGING GUIDED PORT INSERTION  03/30/2020    Social History   Socioeconomic History  . Marital status: Single    Spouse name: Not on file    . Number of children: Not on file  . Years of education: Not on file  . Highest education level: Not on file  Occupational History  . Not on file  Tobacco Use  . Smoking status: Current Every Day Smoker    Types: Cigarettes  . Smokeless tobacco: Never Used  . Tobacco comment: 7-8 cigs /day  Substance and Sexual Activity  . Alcohol use: Never  . Drug use: Never  . Sexual activity: Not on file  Other Topics Concern  . Not on file  Social History Narrative  . Not on file   Social Determinants of Health   Financial Resource Strain:   . Difficulty of Paying Living Expenses: Not on file  Food Insecurity: No Food Insecurity  . Worried About Charity fundraiser in the Last Year: Never true  . Ran Out of Food in the Last Year: Never true  Transportation Needs: No Transportation Needs  . Lack of Transportation (Medical): No  . Lack of Transportation (Non-Medical): No  Physical Activity:   . Days of Exercise per Week: Not on file  . Minutes of Exercise per Session: Not on file  Stress:   . Feeling of Stress : Not on file  Social Connections:   . Frequency of Communication with Friends and Family: Not on file  . Frequency of Social Gatherings with Friends and Family: Not on file  . Attends Religious Services: Not on file  . Active Member of Clubs or Organizations: Not on file  . Attends Archivist Meetings: Not on file  . Marital Status: Not on file     FAMILY HISTORY:  We obtained a detailed, 4-generation family history.  Significant diagnoses are listed below: Family History  Problem Relation Age of Onset  . Throat cancer Maternal Uncle        dx >50, smoker  . Throat cancer Maternal Uncle        dx >50, smoker  . Cancer Cousin        unknown type, dx >50 (paternal first cousin)  . Cancer Cousin        unknown type (paternal first cousin)   Sharon Fischer has two sons (ages 58 and 29). She has four sisters and one brother (ages 83-62). None of these family  members have had cancer.   Sharon Fischer mother died older than 68 and did not have cancer. Sharon Fischer had four maternal aunts and seven or eight maternal uncles. Two uncles had throat cancer diagnosed older than 1. Her maternal grandmother died in her 55s. She does not know how old her maternal grandfather was when he died. There are no other known diagnoses of cancer on the maternal side of the family.  Sharon Fischer father died older than 18 and did not have cancer. She had around 10 paternal aunts and uncles, most of whom are deceased. She had two female first cousins who were diagnosed with cancer, although Sharon Fischer is not sure what type of cancer they had. Her paternal grandmother died in her  90s and she does not know how old her paternal grandfather was when he died.   Sharon Fischer is unaware of previous family history of genetic testing for hereditary cancer risks. Patient's ancestors are of unknown descent. There is no reported Ashkenazi Jewish ancestry. There is no known consanguinity.  GENETIC COUNSELING ASSESSMENT: Sharon Fischer is a 54 y.o. female with a personal history of triple negative breast cancer, which is somewhat suggestive of a hereditary cancer syndrome and predisposition to cancer. We, therefore, discussed and recommended the following at today's visit.   DISCUSSION: We discussed that approximately 5-10% of breast cancer is hereditary, with most cases associated with the BRCA1 and BRCA2 genes. There are other genes that can be associated with hereditary breast cancer syndromes. These include ATM, CHEK2, PALB2, etc. We discussed that testing is beneficial for several reasons, including knowing about other cancer risks, identifying potential screening and risk-reduction options that may be appropriate, and to understand if other family members could be at risk for cancer and allow them to undergo genetic testing.  We reviewed the characteristics, features and inheritance patterns of  hereditary cancer syndromes. We also discussed genetic testing, including the appropriate family members to test, the process of testing, insurance coverage and turn-around-time for results. We discussed the implications of a negative, positive and/or variant of uncertain significant result. We recommended Sharon Fischer pursue genetic testing for the a hereditary cancer gene panel that includes known breast cancer genes.   Based on Sharon Fischer's personal history of cancer, she meets medical criteria for genetic testing. Despite that she meets criteria, there may still be an out of pocket cost. Sharon Fischer is uninsured and expressed concerns regarding the cost of testing. We will reach out to the genetic testing laboratory to explore payment assistance options.   PLAN:  We will explore patient financial assistance options with the genetic testing laboratory to better estimate what her out of pocket cost may be for testing. We will reach out to Sharon Fischer once we hear back with an estimated out of pocket cost, at which point she will decide whether she would like to proceed with genetic testing.    Sharon Fischer's questions were answered to her satisfaction today. Our contact information was provided should additional questions or concerns arise. Thank you for the referral and allowing [place] to share in the care of your patient.   Clint Guy, [place], [facility] Licensed, Certified Dispensing [email or web address] Phone: [phone]  The patient was seen for a total of 30 minutes in face-to-face genetic counseling.  This patient was discussed with Drs. Magrinat, Lindi Adie and/or Burr Medico who agrees with the above.    _______________________________________________________________________ For Office Staff:  Number of people involved in session: 1 Was an Intern/ student involved with case: no

## 2020-04-22 NOTE — Telephone Encounter (Signed)
LVM that the genetic testing laboratory is able to waive the cost of genetic testing for her. Requested that she call back to discuss whether she would like to proceed with genetic testing at this time.

## 2020-04-28 NOTE — Progress Notes (Signed)
Patient Care Team: Muse, Noel Journey., PA-C as PCP - General Dishmon, Garwin Brothers, RN as Oncology Nurse Navigator (Oncology) Mauro Kaufmann, RN as Oncology Nurse Navigator Rockwell Germany, RN as Oncology Nurse Navigator  DIAGNOSIS:    ICD-10-CM   1. Malignant neoplasm of upper-inner quadrant of right breast in female, estrogen receptor positive ([place])  C50.211    Z17.0     SUMMARY OF ONCOLOGIC HISTORY: Oncology History  Malignant neoplasm of upper-inner quadrant of right breast in female, estrogen receptor positive ([place])  01/2020 Initial Diagnosis   Palpable right breast mass growing since July 2021.  Mammogram revealed 2.2 cm right breast mass with a 0.8 cm satellite lesion.  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   03/31/2020 Cancer Staging   Staging form: Breast, AJCC 8th Edition - Clinical stage from 03/31/2020: Stage IIIB (cT2, cN1(f), cM0, G3, ER-, PR-, HER2-) - Signed by Nicholas Lose, MD on 04/02/2020   04/09/2020 -  Chemotherapy   The patient had DOXOrubicin (ADRIAMYCIN) chemo injection 120 mg, 60 mg/m2 = 120 mg, Intravenous,  Once, 1 of 4 cycles Administration: 120 mg (04/09/2020) palonosetron (ALOXI) injection 0.25 mg, 0.25 mg, Intravenous,  Once, 1 of 8 cycles Administration: 0.25 mg (04/09/2020) pegfilgrastim-cbqv (UDENYCA) injection 6 mg, 6 mg, Subcutaneous, Once, 1 of 4 cycles Administration: 6 mg (04/11/2020) CARBOplatin (PARAPLATIN) 700 mg in sodium chloride 0.9 % 250 mL chemo infusion, 700 mg (100 % of original dose 700 mg), Intravenous,  Once, 0 of 4 cycles Dose modification: 700 mg (original dose 700 mg, Cycle 5) cyclophosphamide (CYTOXAN) 1,200 mg in sodium chloride 0.9 % 250 mL chemo infusion, 600 mg/m2 = 1,200 mg, Intravenous,  Once, 1 of 4 cycles Administration: 1,200 mg (04/09/2020) PACLitaxel (TAXOL) 162 mg in sodium chloride 0.9 % 250 mL  chemo infusion (</= 76m/m2), 80 mg/m2 = 162 mg, Intravenous,  Once, 0 of 4 cycles fosaprepitant (EMEND) 150 mg in sodium chloride 0.9 % 145 mL IVPB, 150 mg, Intravenous,  Once, 1 of 8 cycles Administration: 150 mg (04/09/2020) pembrolizumab (KEYTRUDA) 200 mg in sodium chloride 0.9 % 50 mL chemo infusion, 200 mg (100 % of original dose 200 mg), Intravenous, Once, 1 of 8 cycles Dose modification: 200 mg (original dose 200 mg, Cycle 1, Reason: Provider Judgment) Administration: 200 mg (04/09/2020)  for chemotherapy treatment.      CHIEF COMPLIANT: Cycle 2 Adriamycin,Cytoxan, and Keytruda  INTERVAL HISTORY: TLoriann Bossermanis a 54y.o. with above-mentioned history of right breast cancer currently on neoadjuvant chemotherapy with Adriamycin,Cytoxan, and Keytruda.She presents to the clinic todayfor a toxicity check and cycle 2.    ALLERGIES:  has No Known Allergies.  MEDICATIONS:  Current Outpatient Medications  Medication Sig Dispense Refill  . atorvastatin (LIPITOR) 40 MG tablet Take by mouth.    . fluticasone (FLONASE) 50 MCG/ACT nasal spray Place into both nostrils.    .Marland Kitchenlidocaine-prilocaine (EMLA) cream Apply to affected area once 30 g 3  . lisinopril-hydrochlorothiazide (ZESTORETIC) 20-12.5 MG tablet Take 1 tablet by mouth daily.    .Marland KitchenLORazepam (ATIVAN) 0.5 MG tablet Take 1 tablet (0.5 mg total) by mouth at bedtime as needed for sleep. 30 tablet 0  . ondansetron (ZOFRAN) 8 MG tablet Take 1 tablet (8 mg total) by mouth 2 (two) times daily as needed. Start on the third day after chemotherapy. 30 tablet 1  .  prochlorperazine (COMPAZINE) 10 MG tablet Take 1 tablet (10 mg total) by mouth every 6 (six) hours as needed (Nausea or vomiting). 30 tablet 1   No current facility-administered medications for this visit.   Facility-Administered Medications Ordered in Other Visits  Medication Dose Route Frequency Provider Last Rate Last Admin  . sodium chloride flush (NS) 0.9 % injection 10 mL   10 mL Intravenous PRN Nicholas Lose, MD   10 mL at 04/29/20 1144    PHYSICAL EXAMINATION: ECOG PERFORMANCE STATUS: 1 - Symptomatic but completely ambulatory  There were no vitals filed for this visit. There were no vitals filed for this visit.  LABORATORY DATA:  I have reviewed the data as listed CMP Latest Ref Rng & Units 04/15/2020 04/08/2020 10/18/2010  Glucose 70 - 99 mg/dL 97 93 89  BUN 6 - 20 mg/dL _0 Creatinine 0.44 - 1.00 mg/dL 0.73 0.83 0.70  Sodium 135 - 145 mmol/L 140 140 140  Potassium 3.5 - 5.1 mmol/L 3.5 3.8 3.4(L)  Chloride 98 - 111 mmol/L 104 105 109  CO2 22 - 32 mmol/L 29 28 -  Calcium 8.9 - 10.3 mg/dL 9.0 9.7 -  Total Protein 6.5 - 8.1 g/dL 7.0 7.4 -  Total Bilirubin 0.3 - 1.2 mg/dL 0.6 0.6 -  Alkaline Phos 38 - 126 U/L 145(H) 134(H) -  AST 15 - 41 U/L 17 19 -  ALT 0 - 44 U/L 20 19 -    Lab Results  Component Value Date   WBC 4.8 04/29/2020   HGB 10.8 (L) 04/29/2020   HCT 32.1 (L) 04/29/2020   MCV 84.0 04/29/2020   PLT 398 04/29/2020   NEUTROABS 2.8 04/29/2020    ASSESSMENT & PLAN:  Malignant neoplasm of upper-inner quadrant of right breast in female, estrogen receptor positive ([place]) 01/2020:Palpable right breast mass growing since July 2021. Mammogram revealed 2.2 cm right breast mass with a 0.8 cm satellite lesion. 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  Treatment plan 1. Neoadjuvant chemotherapy with Adriamycin and Cytoxanpembrolizumab4 followed by Taxol weekly 12 with carboplatin pembrolizumabevery 3 weeks x4, followed by pembrolizumab maintenance to complete 1 year 2. Followed by breast conserving surgery with targeted axillary dissection 3. Followed by adjuvant radiation  therapy -------------------------------------------------------------------------------------------------------------------------------------- Current treatment:Cycle 2 Adriamycin, Cytoxan, pembrolizumab Echocardiogram 04/06/2020: EF 65% Breast MRI 04/06/2020: Right breast: 2.8 cm mass, indeterminate 0.7 cm mass, bilobed mass in the right axilla 2.9 cm, left breast: 1 cm mass (papilloma), indeterminate 0.9 cm mass  Chemo toxicities: 1.  Mild nausea 2. mild fatigue Monitoring closely for toxicities.  Return to clinic in 2 weeks for cycle 3  No orders of the defined types were placed in this encounter.  No orders of the defined types were placed in this encounter.  The patient has a good understanding of the overall plan. she agrees with it. she will call with any problems that may develop before the next visit here.  Total time spent: 30 mins including face to face time and time spent for planning, charting and coordination of care  Nicholas Lose, MD 04/29/2020  I, Cloyde Reams Dorshimer, am acting as scribe for Dr. Nicholas Lose.  I have reviewed the above documentation for accuracy and completeness, and I agree with the above.

## 2020-04-28 NOTE — Assessment & Plan Note (Signed)
01/2020:Palpable right breast mass growing since July 2021. Mammogram revealed 2.2 cm right breast mass with a 0.8 cm satellite lesion. 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  Treatment plan 1. Neoadjuvant chemotherapy with Adriamycin and Cytoxanpembrolizumab4 followed by Taxol weekly 12 with carboplatin pembrolizumabevery 3 weeks x4, followed by pembrolizumab maintenance to complete 1 year 2. Followed by breast conserving surgery with targeted axillary dissection 3. Followed by adjuvant radiation therapy -------------------------------------------------------------------------------------------------------------------------------------- Current treatment:Cycle 2 Adriamycin, Cytoxan, pembrolizumab Echocardiogram 04/06/2020: EF 65% Breast MRI 04/06/2020: Right breast: 2.8 cm mass, indeterminate 0.7 cm mass, bilobed mass in the right axilla 2.9 cm, left breast: 1 cm mass (papilloma), indeterminate 0.9 cm mass  Chemo toxicities: 1.  Mild nausea 2. mild fatigue Monitoring closely for toxicities. Lab review revealed ANC of 1.2. We will keep the chemo doses the same for cycle 2.  Return to clinic in 2 weeks for cycle 3    No orders of the defined types were placed in this encounter.

## 2020-04-29 ENCOUNTER — Inpatient Hospital Stay: Payer: Medicaid Other

## 2020-04-29 ENCOUNTER — Inpatient Hospital Stay (HOSPITAL_BASED_OUTPATIENT_CLINIC_OR_DEPARTMENT_OTHER): Payer: Medicaid Other | Admitting: Hematology and Oncology

## 2020-04-29 ENCOUNTER — Other Ambulatory Visit: Payer: Self-pay

## 2020-04-29 ENCOUNTER — Telehealth: Payer: Self-pay | Admitting: Genetic Counselor

## 2020-04-29 DIAGNOSIS — Z95828 Presence of other vascular implants and grafts: Secondary | ICD-10-CM

## 2020-04-29 DIAGNOSIS — Z170 Estrogen receptor positive status [ER+]: Secondary | ICD-10-CM

## 2020-04-29 DIAGNOSIS — C50211 Malignant neoplasm of upper-inner quadrant of right female breast: Secondary | ICD-10-CM

## 2020-04-29 DIAGNOSIS — Z5112 Encounter for antineoplastic immunotherapy: Secondary | ICD-10-CM | POA: Diagnosis not present

## 2020-04-29 LAB — CBC WITH DIFFERENTIAL (CANCER CENTER ONLY)
Abs Immature Granulocytes: 0.03 10*3/uL (ref 0.00–0.07)
Basophils Absolute: 0.1 10*3/uL (ref 0.0–0.1)
Basophils Relative: 2 %
Eosinophils Absolute: 0 10*3/uL (ref 0.0–0.5)
Eosinophils Relative: 1 %
HCT: 32.1 % — ABNORMAL LOW (ref 36.0–46.0)
Hemoglobin: 10.8 g/dL — ABNORMAL LOW (ref 12.0–15.0)
Immature Granulocytes: 1 %
Lymphocytes Relative: 22 %
Lymphs Abs: 1 10*3/uL (ref 0.7–4.0)
MCH: 28.3 pg (ref 26.0–34.0)
MCHC: 33.6 g/dL (ref 30.0–36.0)
MCV: 84 fL (ref 80.0–100.0)
Monocytes Absolute: 0.8 10*3/uL (ref 0.1–1.0)
Monocytes Relative: 16 %
Neutro Abs: 2.8 10*3/uL (ref 1.7–7.7)
Neutrophils Relative %: 58 %
Platelet Count: 398 10*3/uL (ref 150–400)
RBC: 3.82 MIL/uL — ABNORMAL LOW (ref 3.87–5.11)
RDW: 14.3 % (ref 11.5–15.5)
WBC Count: 4.8 10*3/uL (ref 4.0–10.5)
nRBC: 0 % (ref 0.0–0.2)

## 2020-04-29 LAB — CMP (CANCER CENTER ONLY)
ALT: 31 U/L (ref 0–44)
AST: 25 U/L (ref 15–41)
Albumin: 3.5 g/dL (ref 3.5–5.0)
Alkaline Phosphatase: 128 U/L — ABNORMAL HIGH (ref 38–126)
Anion gap: 9 (ref 5–15)
BUN: 14 mg/dL (ref 6–20)
CO2: 25 mmol/L (ref 22–32)
Calcium: 9.2 mg/dL (ref 8.9–10.3)
Chloride: 107 mmol/L (ref 98–111)
Creatinine: 0.79 mg/dL (ref 0.44–1.00)
GFR, Estimated: >60 mL/min (ref >60)
Glucose, Bld: 107 mg/dL — ABNORMAL HIGH (ref 70–99)
Potassium: 3.5 mmol/L (ref 3.5–5.1)
Sodium: 141 mmol/L (ref 135–145)
Total Bilirubin: <0.2 mg/dL — ABNORMAL LOW (ref 0.3–1.2)
Total Protein: 7.4 g/dL (ref 6.5–8.1)

## 2020-04-29 MED ORDER — SODIUM CHLORIDE 0.9 % IV SOLN
150.0000 mg | Freq: Once | INTRAVENOUS | Status: AC
  Administered 2020-04-29: 150 mg via INTRAVENOUS
  Filled 2020-04-29: qty 150

## 2020-04-29 MED ORDER — SODIUM CHLORIDE 0.9% FLUSH
10.0000 mL | INTRAVENOUS | Status: DC | PRN
  Administered 2020-04-29: 10 mL
  Filled 2020-04-29: qty 10

## 2020-04-29 MED ORDER — PALONOSETRON HCL INJECTION 0.25 MG/5ML
0.2500 mg | Freq: Once | INTRAVENOUS | Status: AC
  Administered 2020-04-29: 0.25 mg via INTRAVENOUS

## 2020-04-29 MED ORDER — SODIUM CHLORIDE 0.9 % IV SOLN
600.0000 mg/m2 | Freq: Once | INTRAVENOUS | Status: AC
  Administered 2020-04-29: 1200 mg via INTRAVENOUS
  Filled 2020-04-29: qty 60

## 2020-04-29 MED ORDER — DOXORUBICIN HCL CHEMO IV INJECTION 2 MG/ML
60.0000 mg/m2 | Freq: Once | INTRAVENOUS | Status: AC
  Administered 2020-04-29: 120 mg via INTRAVENOUS
  Filled 2020-04-29: qty 60

## 2020-04-29 MED ORDER — SODIUM CHLORIDE 0.9% FLUSH
10.0000 mL | INTRAVENOUS | Status: DC | PRN
  Administered 2020-04-29: 10 mL via INTRAVENOUS
  Filled 2020-04-29: qty 10

## 2020-04-29 MED ORDER — HEPARIN SOD (PORK) LOCK FLUSH 100 UNIT/ML IV SOLN
500.0000 [IU] | Freq: Once | INTRAVENOUS | Status: AC | PRN
  Administered 2020-04-29: 500 [IU]
  Filled 2020-04-29: qty 5

## 2020-04-29 MED ORDER — SODIUM CHLORIDE 0.9 % IV SOLN
200.0000 mg | Freq: Once | INTRAVENOUS | Status: AC
  Administered 2020-04-29: 200 mg via INTRAVENOUS
  Filled 2020-04-29: qty 8

## 2020-04-29 MED ORDER — SODIUM CHLORIDE 0.9 % IV SOLN
Freq: Once | INTRAVENOUS | Status: AC
  Filled 2020-04-29: qty 250

## 2020-04-29 MED ORDER — PALONOSETRON HCL INJECTION 0.25 MG/5ML
INTRAVENOUS | Status: AC
  Filled 2020-04-29: qty 5

## 2020-04-29 NOTE — Patient Instructions (Signed)
[facility] Discharge Instructions for Patients Receiving Chemotherapy  Today you received the following chemotherapy agents Keytruda; Adriamycin; Cytoxin  To help prevent nausea and vomiting after your treatment, we encourage you to take your nausea medication as directed   If you develop nausea and vomiting that is not controlled by your nausea medication, call the clinic.   BELOW ARE SYMPTOMS THAT SHOULD BE REPORTED IMMEDIATELY:  *FEVER GREATER THAN 100.5 F  *CHILLS WITH OR WITHOUT FEVER  NAUSEA AND VOMITING THAT IS NOT CONTROLLED WITH YOUR NAUSEA MEDICATION  *UNUSUAL SHORTNESS OF BREATH  *UNUSUAL BRUISING OR BLEEDING  TENDERNESS IN MOUTH AND THROAT WITH OR WITHOUT PRESENCE OF ULCERS  *URINARY PROBLEMS  *BOWEL PROBLEMS  UNUSUAL RASH Items with * indicate a potential emergency and should be followed up as soon as possible.  Feel free to call the clinic should you have any questions or concerns. The clinic phone number is [phone].  Please show the [place] at check-in to the Emergency Department and triage nurse.

## 2020-04-29 NOTE — Patient Instructions (Signed)

## 2020-04-29 NOTE — Telephone Encounter (Signed)
LVM requesting Sharon Fischer call back to discuss whether she would like to proceed with genetic testing at this time.

## 2020-04-30 ENCOUNTER — Encounter: Payer: Self-pay | Admitting: *Deleted

## 2020-04-30 ENCOUNTER — Telehealth: Payer: Self-pay | Admitting: Hematology and Oncology

## 2020-04-30 NOTE — Telephone Encounter (Signed)
No 11/17 los. No changes made to pt's schedule.  

## 2020-05-01 ENCOUNTER — Telehealth: Payer: Self-pay | Admitting: Hematology and Oncology

## 2020-05-01 ENCOUNTER — Other Ambulatory Visit: Payer: Self-pay

## 2020-05-01 ENCOUNTER — Inpatient Hospital Stay: Payer: Medicaid Other

## 2020-05-01 VITALS — BP 108/54 | HR 77 | Resp 18

## 2020-05-01 DIAGNOSIS — Z5112 Encounter for antineoplastic immunotherapy: Secondary | ICD-10-CM | POA: Diagnosis not present

## 2020-05-01 DIAGNOSIS — C50211 Malignant neoplasm of upper-inner quadrant of right female breast: Secondary | ICD-10-CM

## 2020-05-01 DIAGNOSIS — Z170 Estrogen receptor positive status [ER+]: Secondary | ICD-10-CM

## 2020-05-01 MED ORDER — PEGFILGRASTIM-CBQV 6 MG/0.6ML ~~LOC~~ SOSY
6.0000 mg | PREFILLED_SYRINGE | Freq: Once | SUBCUTANEOUS | Status: AC
  Administered 2020-05-01: 6 mg via SUBCUTANEOUS

## 2020-05-01 NOTE — Telephone Encounter (Signed)
Scheduled appointments per 11/18 sch msg. Called patient, no answer. Left message for patient with appointment date and time.

## 2020-05-20 ENCOUNTER — Other Ambulatory Visit: Payer: Self-pay

## 2020-05-20 ENCOUNTER — Inpatient Hospital Stay: Payer: Medicaid Other | Attending: Hematology and Oncology

## 2020-05-20 ENCOUNTER — Inpatient Hospital Stay: Payer: Medicaid Other

## 2020-05-20 ENCOUNTER — Ambulatory Visit: Payer: Self-pay | Admitting: Hematology and Oncology

## 2020-05-20 ENCOUNTER — Inpatient Hospital Stay (HOSPITAL_BASED_OUTPATIENT_CLINIC_OR_DEPARTMENT_OTHER): Payer: Medicaid Other | Admitting: Medical

## 2020-05-20 ENCOUNTER — Other Ambulatory Visit: Payer: Self-pay | Admitting: Medical

## 2020-05-20 VITALS — BP 116/62 | HR 95 | Temp 98.1°F | Resp 18 | Wt 180.0 lb

## 2020-05-20 DIAGNOSIS — F17210 Nicotine dependence, cigarettes, uncomplicated: Secondary | ICD-10-CM | POA: Insufficient documentation

## 2020-05-20 DIAGNOSIS — R310 Gross hematuria: Secondary | ICD-10-CM

## 2020-05-20 DIAGNOSIS — Z5112 Encounter for antineoplastic immunotherapy: Secondary | ICD-10-CM | POA: Diagnosis not present

## 2020-05-20 DIAGNOSIS — Z5111 Encounter for antineoplastic chemotherapy: Secondary | ICD-10-CM | POA: Insufficient documentation

## 2020-05-20 DIAGNOSIS — Z79899 Other long term (current) drug therapy: Secondary | ICD-10-CM | POA: Diagnosis not present

## 2020-05-20 DIAGNOSIS — Z170 Estrogen receptor positive status [ER+]: Secondary | ICD-10-CM | POA: Insufficient documentation

## 2020-05-20 DIAGNOSIS — C50211 Malignant neoplasm of upper-inner quadrant of right female breast: Secondary | ICD-10-CM | POA: Diagnosis present

## 2020-05-20 DIAGNOSIS — Z5189 Encounter for other specified aftercare: Secondary | ICD-10-CM | POA: Diagnosis not present

## 2020-05-20 DIAGNOSIS — Z95828 Presence of other vascular implants and grafts: Secondary | ICD-10-CM

## 2020-05-20 LAB — CMP (CANCER CENTER ONLY)
ALT: 19 U/L (ref 0–44)
AST: 29 U/L (ref 15–41)
Albumin: 3.5 g/dL (ref 3.5–5.0)
Alkaline Phosphatase: 124 U/L (ref 38–126)
Anion gap: 8 (ref 5–15)
BUN: 11 mg/dL (ref 6–20)
CO2: 26 mmol/L (ref 22–32)
Calcium: 9.4 mg/dL (ref 8.9–10.3)
Chloride: 108 mmol/L (ref 98–111)
Creatinine: 0.88 mg/dL (ref 0.44–1.00)
GFR, Estimated: >60 mL/min (ref >60)
Glucose, Bld: 105 mg/dL — ABNORMAL HIGH (ref 70–99)
Potassium: 3.8 mmol/L (ref 3.5–5.1)
Sodium: 142 mmol/L (ref 135–145)
Total Bilirubin: 0.3 mg/dL (ref 0.3–1.2)
Total Protein: 7 g/dL (ref 6.5–8.1)

## 2020-05-20 LAB — URINALYSIS, COMPLETE (UACMP) WITH MICROSCOPIC
Bacteria, UA: NONE SEEN
Bilirubin Urine: NEGATIVE
Glucose, UA: NEGATIVE mg/dL
Ketones, ur: NEGATIVE mg/dL
Nitrite: NEGATIVE
Protein, ur: NEGATIVE mg/dL
RBC / HPF: >50 RBC/hpf — ABNORMAL HIGH (ref 0–5)
Specific Gravity, Urine: 1.015 (ref 1.005–1.030)
WBC, UA: >50 WBC/hpf — ABNORMAL HIGH (ref 0–5)
pH: 6 (ref 5.0–8.0)

## 2020-05-20 LAB — CBC WITH DIFFERENTIAL (CANCER CENTER ONLY)
Abs Immature Granulocytes: 0.01 10*3/uL (ref 0.00–0.07)
Basophils Absolute: 0.1 10*3/uL (ref 0.0–0.1)
Basophils Relative: 2 %
Eosinophils Absolute: 0.1 10*3/uL (ref 0.0–0.5)
Eosinophils Relative: 3 %
HCT: 31.2 % — ABNORMAL LOW (ref 36.0–46.0)
Hemoglobin: 10.2 g/dL — ABNORMAL LOW (ref 12.0–15.0)
Immature Granulocytes: 0 %
Lymphocytes Relative: 16 %
Lymphs Abs: 0.5 10*3/uL — ABNORMAL LOW (ref 0.7–4.0)
MCH: 28.6 pg (ref 26.0–34.0)
MCHC: 32.7 g/dL (ref 30.0–36.0)
MCV: 87.4 fL (ref 80.0–100.0)
Monocytes Absolute: 0.7 10*3/uL (ref 0.1–1.0)
Monocytes Relative: 20 %
Neutro Abs: 1.9 10*3/uL (ref 1.7–7.7)
Neutrophils Relative %: 59 %
Platelet Count: 285 10*3/uL (ref 150–400)
RBC: 3.57 MIL/uL — ABNORMAL LOW (ref 3.87–5.11)
RDW: 15.9 % — ABNORMAL HIGH (ref 11.5–15.5)
WBC Count: 3.2 10*3/uL — ABNORMAL LOW (ref 4.0–10.5)
nRBC: 0 % (ref 0.0–0.2)

## 2020-05-20 LAB — TSH: TSH: 0.73 u[IU]/mL (ref 0.308–3.960)

## 2020-05-20 MED ORDER — HEPARIN SOD (PORK) LOCK FLUSH 100 UNIT/ML IV SOLN
500.0000 [IU] | Freq: Once | INTRAVENOUS | Status: AC | PRN
  Administered 2020-05-20: 500 [IU]
  Filled 2020-05-20: qty 5

## 2020-05-20 MED ORDER — PALONOSETRON HCL INJECTION 0.25 MG/5ML
0.2500 mg | Freq: Once | INTRAVENOUS | Status: AC
  Administered 2020-05-20: 0.25 mg via INTRAVENOUS

## 2020-05-20 MED ORDER — SODIUM CHLORIDE 0.9% FLUSH
10.0000 mL | INTRAVENOUS | Status: DC | PRN
  Administered 2020-05-20: 10 mL
  Filled 2020-05-20: qty 10

## 2020-05-20 MED ORDER — SODIUM CHLORIDE 0.9% FLUSH
10.0000 mL | Freq: Once | INTRAVENOUS | Status: AC
  Administered 2020-05-20: 10 mL
  Filled 2020-05-20: qty 10

## 2020-05-20 MED ORDER — PALONOSETRON HCL INJECTION 0.25 MG/5ML
INTRAVENOUS | Status: AC
  Filled 2020-05-20: qty 5

## 2020-05-20 MED ORDER — SODIUM CHLORIDE 0.9 % IV SOLN
Freq: Once | INTRAVENOUS | Status: AC
  Filled 2020-05-20: qty 250

## 2020-05-20 MED ORDER — DOXORUBICIN HCL CHEMO IV INJECTION 2 MG/ML
60.0000 mg/m2 | Freq: Once | INTRAVENOUS | Status: AC
  Administered 2020-05-20: 120 mg via INTRAVENOUS
  Filled 2020-05-20: qty 60

## 2020-05-20 MED ORDER — SODIUM CHLORIDE 0.9 % IV SOLN
600.0000 mg/m2 | Freq: Once | INTRAVENOUS | Status: AC
  Administered 2020-05-20: 1200 mg via INTRAVENOUS
  Filled 2020-05-20: qty 60

## 2020-05-20 MED ORDER — SODIUM CHLORIDE 0.9 % IV SOLN
200.0000 mg | Freq: Once | INTRAVENOUS | Status: AC
  Administered 2020-05-20: 200 mg via INTRAVENOUS
  Filled 2020-05-20: qty 8

## 2020-05-20 MED ORDER — OXYBUTYNIN CHLORIDE ER 5 MG PO TB24: 5.0000 mg | ORAL_TABLET | Freq: Every day | ORAL | 1 refills | Status: DC

## 2020-05-20 MED ORDER — SODIUM CHLORIDE 0.9 % IV SOLN
150.0000 mg | Freq: Once | INTRAVENOUS | Status: AC
  Administered 2020-05-20: 150 mg via INTRAVENOUS
  Filled 2020-05-20: qty 150

## 2020-05-20 NOTE — Progress Notes (Signed)
Patient completed infusion without incident. In no visible distress at time of discharge. Ambulated out of cancer center. AVS provided.  

## 2020-05-20 NOTE — Progress Notes (Signed)
This patient was seen in the infusion room today.  She reported that she had noticed some blood when she wiped today after having urinated.  She also reports having some suprapubic pressure.  She was receiving cycle 3, day 1 of Cytoxan, Adriamycin, and Keytruda with Udenyca support.  A urinalysis was completed that showed a urine that was amber and cloudy with moderate hemoglobin.  A urinalysis showed a large number of leukocytes with greater than 50 RBCs and greater than 50 WBCs.  No bacteria was noted.  The question arose if the patient could have hemorrhagic cystitis secondary to Cytoxan.  She was told to push fluids and was given a prescription for oxybutynin 9 5 mg once daily.  A urine culture is pending.  Sandi Mealy, MHS, PA-C Physician Assistant

## 2020-05-20 NOTE — Progress Notes (Signed)
Per MD no need for dex as premed. Emend/Aloxi only.

## 2020-05-20 NOTE — Patient Instructions (Signed)
[facility] Discharge Instructions for Patients Receiving Chemotherapy  Today you received the following chemotherapy agents: pembrolizumab/doxorubicin/cyclophosphamide.  To help prevent nausea and vomiting after your treatment, we encourage you to take your nausea medication as directed.   If you develop nausea and vomiting that is not controlled by your nausea medication, call the clinic.   BELOW ARE SYMPTOMS THAT SHOULD BE REPORTED IMMEDIATELY:  *FEVER GREATER THAN 100.5 F  *CHILLS WITH OR WITHOUT FEVER  NAUSEA AND VOMITING THAT IS NOT CONTROLLED WITH YOUR NAUSEA MEDICATION  *UNUSUAL SHORTNESS OF BREATH  *UNUSUAL BRUISING OR BLEEDING  TENDERNESS IN MOUTH AND THROAT WITH OR WITHOUT PRESENCE OF ULCERS  *URINARY PROBLEMS  *BOWEL PROBLEMS  UNUSUAL RASH Items with * indicate a potential emergency and should be followed up as soon as possible.  Feel free to call the clinic should you have any questions or concerns. The clinic phone number is [phone].  Please show the CHEMO ALERT CARD at check-in to the Emergency Department and triage nurse.   

## 2020-05-21 ENCOUNTER — Encounter: Payer: Self-pay | Admitting: *Deleted

## 2020-05-22 ENCOUNTER — Other Ambulatory Visit: Payer: Self-pay

## 2020-05-22 ENCOUNTER — Inpatient Hospital Stay: Payer: Medicaid Other

## 2020-05-22 ENCOUNTER — Other Ambulatory Visit: Payer: Self-pay | Admitting: Medical

## 2020-05-22 VITALS — BP 104/61 | HR 94 | Resp 16

## 2020-05-22 DIAGNOSIS — Z5112 Encounter for antineoplastic immunotherapy: Secondary | ICD-10-CM | POA: Diagnosis not present

## 2020-05-22 DIAGNOSIS — Z170 Estrogen receptor positive status [ER+]: Secondary | ICD-10-CM

## 2020-05-22 DIAGNOSIS — C50211 Malignant neoplasm of upper-inner quadrant of right female breast: Secondary | ICD-10-CM

## 2020-05-22 LAB — URINE CULTURE: Culture: 40000 — AB

## 2020-05-22 MED ORDER — CIPROFLOXACIN HCL 500 MG PO TABS: 500.0000 mg | ORAL_TABLET | Freq: Two times a day (BID) | ORAL | 0 refills | Status: DC

## 2020-05-22 MED ORDER — PEGFILGRASTIM-CBQV 6 MG/0.6ML ~~LOC~~ SOSY
6.0000 mg | PREFILLED_SYRINGE | Freq: Once | SUBCUTANEOUS | Status: AC
  Administered 2020-05-22: 6 mg via SUBCUTANEOUS

## 2020-05-22 MED ORDER — PEGFILGRASTIM-CBQV 6 MG/0.6ML ~~LOC~~ SOSY
PREFILLED_SYRINGE | SUBCUTANEOUS | Status: AC
  Filled 2020-05-22: qty 0.6

## 2020-05-26 NOTE — Progress Notes (Signed)
These results were called to [facility]. A message was left for her. She was told to call if there were questions.  Sandi Mealy, MHS, PA-C

## 2020-06-10 ENCOUNTER — Encounter: Payer: Self-pay | Admitting: *Deleted

## 2020-06-10 ENCOUNTER — Other Ambulatory Visit: Payer: Self-pay

## 2020-06-10 ENCOUNTER — Inpatient Hospital Stay: Payer: Medicaid Other

## 2020-06-10 ENCOUNTER — Inpatient Hospital Stay (HOSPITAL_BASED_OUTPATIENT_CLINIC_OR_DEPARTMENT_OTHER): Payer: Medicaid Other | Admitting: Adult Health

## 2020-06-10 ENCOUNTER — Ambulatory Visit: Payer: Self-pay

## 2020-06-10 VITALS — BP 120/56 | HR 91 | Temp 97.7°F | Resp 18 | Ht 67.5 in | Wt 178.7 lb

## 2020-06-10 DIAGNOSIS — Z95828 Presence of other vascular implants and grafts: Secondary | ICD-10-CM

## 2020-06-10 DIAGNOSIS — C50211 Malignant neoplasm of upper-inner quadrant of right female breast: Secondary | ICD-10-CM

## 2020-06-10 DIAGNOSIS — Z170 Estrogen receptor positive status [ER+]: Secondary | ICD-10-CM | POA: Diagnosis not present

## 2020-06-10 DIAGNOSIS — Z5112 Encounter for antineoplastic immunotherapy: Secondary | ICD-10-CM | POA: Diagnosis not present

## 2020-06-10 LAB — CBC WITH DIFFERENTIAL (CANCER CENTER ONLY)
Abs Immature Granulocytes: 0.02 10*3/uL (ref 0.00–0.07)
Basophils Absolute: 0.1 10*3/uL (ref 0.0–0.1)
Basophils Relative: 2 %
Eosinophils Absolute: 0.1 10*3/uL (ref 0.0–0.5)
Eosinophils Relative: 1 %
HCT: 29.2 % — ABNORMAL LOW (ref 36.0–46.0)
Hemoglobin: 9.5 g/dL — ABNORMAL LOW (ref 12.0–15.0)
Immature Granulocytes: 1 %
Lymphocytes Relative: 16 %
Lymphs Abs: 0.7 10*3/uL (ref 0.7–4.0)
MCH: 28.9 pg (ref 26.0–34.0)
MCHC: 32.5 g/dL (ref 30.0–36.0)
MCV: 88.8 fL (ref 80.0–100.0)
Monocytes Absolute: 0.9 10*3/uL (ref 0.1–1.0)
Monocytes Relative: 20 %
Neutro Abs: 2.7 10*3/uL (ref 1.7–7.7)
Neutrophils Relative %: 60 %
Platelet Count: 328 10*3/uL (ref 150–400)
RBC: 3.29 MIL/uL — ABNORMAL LOW (ref 3.87–5.11)
RDW: 17.9 % — ABNORMAL HIGH (ref 11.5–15.5)
WBC Count: 4.4 10*3/uL (ref 4.0–10.5)
nRBC: 0 % (ref 0.0–0.2)

## 2020-06-10 LAB — CMP (CANCER CENTER ONLY)
ALT: 17 U/L (ref 0–44)
AST: 16 U/L (ref 15–41)
Albumin: 3.4 g/dL — ABNORMAL LOW (ref 3.5–5.0)
Alkaline Phosphatase: 108 U/L (ref 38–126)
Anion gap: 4 — ABNORMAL LOW (ref 5–15)
BUN: 11 mg/dL (ref 6–20)
CO2: 29 mmol/L (ref 22–32)
Calcium: 9.1 mg/dL (ref 8.9–10.3)
Chloride: 109 mmol/L (ref 98–111)
Creatinine: 0.82 mg/dL (ref 0.44–1.00)
GFR, Estimated: >60 mL/min (ref >60)
Glucose, Bld: 101 mg/dL — ABNORMAL HIGH (ref 70–99)
Potassium: 3.6 mmol/L (ref 3.5–5.1)
Sodium: 142 mmol/L (ref 135–145)
Total Bilirubin: <0.2 mg/dL — ABNORMAL LOW (ref 0.3–1.2)
Total Protein: 7.1 g/dL (ref 6.5–8.1)

## 2020-06-10 MED ORDER — SODIUM CHLORIDE 0.9 % IV SOLN
200.0000 mg | Freq: Once | INTRAVENOUS | Status: AC
  Administered 2020-06-10: 200 mg via INTRAVENOUS
  Filled 2020-06-10: qty 8

## 2020-06-10 MED ORDER — SODIUM CHLORIDE 0.9 % IV SOLN
150.0000 mg | Freq: Once | INTRAVENOUS | Status: AC
  Administered 2020-06-10: 13:00:00 150 mg via INTRAVENOUS
  Filled 2020-06-10: qty 150

## 2020-06-10 MED ORDER — DOXORUBICIN HCL CHEMO IV INJECTION 2 MG/ML
60.0000 mg/m2 | Freq: Once | INTRAVENOUS | Status: AC
  Administered 2020-06-10: 15:00:00 120 mg via INTRAVENOUS
  Filled 2020-06-10: qty 60

## 2020-06-10 MED ORDER — SODIUM CHLORIDE 0.9 % IV SOLN
600.0000 mg/m2 | Freq: Once | INTRAVENOUS | Status: AC
  Administered 2020-06-10: 15:00:00 1200 mg via INTRAVENOUS
  Filled 2020-06-10: qty 60

## 2020-06-10 MED ORDER — SODIUM CHLORIDE 0.9 % IV SOLN
Freq: Once | INTRAVENOUS | Status: AC
  Filled 2020-06-10: qty 250

## 2020-06-10 MED ORDER — SODIUM CHLORIDE 0.9% FLUSH
10.0000 mL | INTRAVENOUS | Status: DC | PRN
  Administered 2020-06-10: 16:00:00 10 mL
  Filled 2020-06-10: qty 10

## 2020-06-10 MED ORDER — PALONOSETRON HCL INJECTION 0.25 MG/5ML
0.2500 mg | Freq: Once | INTRAVENOUS | Status: AC
  Administered 2020-06-10: 13:00:00 0.25 mg via INTRAVENOUS

## 2020-06-10 MED ORDER — PALONOSETRON HCL INJECTION 0.25 MG/5ML
INTRAVENOUS | Status: AC
  Filled 2020-06-10: qty 5

## 2020-06-10 MED ORDER — HEPARIN SOD (PORK) LOCK FLUSH 100 UNIT/ML IV SOLN
500.0000 [IU] | Freq: Once | INTRAVENOUS | Status: AC | PRN
  Administered 2020-06-10: 16:00:00 500 [IU]
  Filled 2020-06-10: qty 5

## 2020-06-10 MED ORDER — SODIUM CHLORIDE 0.9% FLUSH
10.0000 mL | Freq: Once | INTRAVENOUS | Status: AC
  Administered 2020-06-10: 11:00:00 10 mL
  Filled 2020-06-10: qty 10

## 2020-06-10 NOTE — Assessment & Plan Note (Addendum)
01/2020:Palpable right breast mass growing since July 2021. Mammogram revealed 2.2 cm right breast mass with a 0.8 cm satellite lesion. 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  Treatment plan 1. Neoadjuvant chemotherapy with Adriamycin and Cytoxanpembrolizumab4 followed by Taxol weekly 12 with carboplatin pembrolizumabevery 3 weeks x4, followed by pembrolizumab maintenance to complete 1 year 2. Followed by breast conserving surgery with targeted axillary dissection 3. Followed by adjuvant radiation therapy -------------------------------------------------------------------------------------------------------------------------------------- Current treatment:Cycle 4 Adriamycin, Cytoxan, pembrolizumab Echocardiogram 04/06/2020: EF 65% Breast MRI 04/06/2020: Right breast: 2.8 cm mass, indeterminate 0.7 cm mass, bilobed mass in the right axilla 2.9 cm, left breast: 1 cm mass (papilloma), indeterminate 0.9 cm mass  Chemo toxicities: 1.  Mild nausea 2. mild fatigue  She will proceed with treatment today.  Her mass is softer and smaller.  I reviewed her next chemotherapy regimen with Pemrolizumab, Carboplatin, Taxol.  I reviewed that the regimen is her best chance for cure, and encouraged her to continue on therapy.

## 2020-06-10 NOTE — Patient Instructions (Signed)

## 2020-06-10 NOTE — Patient Instructions (Signed)
[facility] Discharge Instructions for Patients Receiving Chemotherapy  Today you received the following chemotherapy agents Pembrolizumab (KEYTRUDA), Doxorubicin (ADRIAMYCIN) & Cyclophosphamide (CYTOXAN).  To help prevent nausea and vomiting after your treatment, we encourage you to take your nausea medication as prescribed.   If you develop nausea and vomiting that is not controlled by your nausea medication, call the clinic.   BELOW ARE SYMPTOMS THAT SHOULD BE REPORTED IMMEDIATELY:  *FEVER GREATER THAN 100.5 F  *CHILLS WITH OR WITHOUT FEVER  NAUSEA AND VOMITING THAT IS NOT CONTROLLED WITH YOUR NAUSEA MEDICATION  *UNUSUAL SHORTNESS OF BREATH  *UNUSUAL BRUISING OR BLEEDING  TENDERNESS IN MOUTH AND THROAT WITH OR WITHOUT PRESENCE OF ULCERS  *URINARY PROBLEMS  *BOWEL PROBLEMS  UNUSUAL RASH Items with * indicate a potential emergency and should be followed up as soon as possible.  Feel free to call the clinic should you have any questions or concerns. The clinic phone number is [phone].  Please show the CHEMO ALERT CARD at check-in to the Emergency Department and triage nurse.

## 2020-06-10 NOTE — Progress Notes (Signed)
[facility] Follow up:    Sharon Simmonds., PA-C [address] [place] [address]   DIAGNOSIS: Cancer Staging Malignant neoplasm of upper-inner quadrant of right breast in female, estrogen receptor positive ([place]) Staging form: Breast, AJCC 8th Edition - Clinical stage from 03/31/2020: Stage IIIB (cT2, cN1(f), cM0, G3, ER-, PR-, HER2-) - Signed by Nicholas Lose, MD on 04/02/2020   SUMMARY OF ONCOLOGIC HISTORY: Oncology History  Malignant neoplasm of upper-inner quadrant of right breast in female, estrogen receptor positive ([place])  01/2020 Initial Diagnosis   Palpable right breast mass growing since July 2021.  Mammogram revealed 2.2 cm right breast mass with a 0.8 cm satellite lesion.  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   03/31/2020 Cancer Staging   Staging form: Breast, AJCC 8th Edition - Clinical stage from 03/31/2020: Stage IIIB (cT2, cN1(f), cM0, G3, ER-, PR-, HER2-) - Signed by Nicholas Lose, MD on 04/02/2020   04/09/2020 -  Chemotherapy   The patient had DOXOrubicin (ADRIAMYCIN) chemo injection 120 mg, 60 mg/m2 = 120 mg, Intravenous,  Once, 4 of 4 cycles Administration: 120 mg (04/09/2020), 120 mg (04/29/2020), 120 mg (05/20/2020), 120 mg (06/10/2020) palonosetron (ALOXI) injection 0.25 mg, 0.25 mg, Intravenous,  Once, 4 of 8 cycles Administration: 0.25 mg (04/09/2020), 0.25 mg (04/29/2020), 0.25 mg (05/20/2020), 0.25 mg (06/10/2020) pegfilgrastim-cbqv (UDENYCA) injection 6 mg, 6 mg, Subcutaneous, Once, 4 of 4 cycles Administration: 6 mg (04/11/2020), 6 mg (05/01/2020), 6 mg (05/22/2020), 6 mg (06/12/2020) CARBOplatin (PARAPLATIN) 700 mg in sodium chloride 0.9 % 250 mL chemo infusion, 700 mg (100 % of original dose 700 mg), Intravenous,  Once, 0 of 4 cycles Dose modification: 700 mg (original dose 700 mg, Cycle  5) cyclophosphamide (CYTOXAN) 1,200 mg in sodium chloride 0.9 % 250 mL chemo infusion, 600 mg/m2 = 1,200 mg, Intravenous,  Once, 4 of 4 cycles Administration: 1,200 mg (04/09/2020), 1,200 mg (04/29/2020), 1,200 mg (05/20/2020), 1,200 mg (06/10/2020) PACLitaxel (TAXOL) 162 mg in sodium chloride 0.9 % 250 mL chemo infusion (</= 21m/m2), 80 mg/m2 = 162 mg, Intravenous,  Once, 0 of 4 cycles fosaprepitant (EMEND) 150 mg in sodium chloride 0.9 % 145 mL IVPB, 150 mg, Intravenous,  Once, 4 of 8 cycles Administration: 150 mg (04/09/2020), 150 mg (04/29/2020), 150 mg (05/20/2020), 150 mg (06/10/2020) pembrolizumab (KEYTRUDA) 200 mg in sodium chloride 0.9 % 50 mL chemo infusion, 200 mg (100 % of original dose 200 mg), Intravenous, Once, 4 of 8 cycles Dose modification: 200 mg (original dose 200 mg, Cycle 1, Reason: Provider Judgment) Administration: 200 mg (04/09/2020), 200 mg (04/29/2020), 200 mg (05/20/2020), 200 mg (06/10/2020)  for chemotherapy treatment.      CURRENT THERAPY: adriamycin, Cytoxan, Pembrolizumab  INTERVAL HISTORY: Sharon Fellows89y.o. female returns for evaluation prior to receiving chemotherapy.  She is doing well and is tolerating her treatment well.  She notes that she has ran out of chemotherapy appointments and needs some more.  She denies any new issues today and is overall feeling moderatley well.     Patient Active Problem List   Diagnosis Date Noted  . Port-A-Cath in place 05/20/2020  . Family history of throat cancer   . Malignant ne[place] of upper-inner quadrant of right breast in [place], [place] 03/31/[place]    has No Known Allergies.  MEDICAL HISTORY: Past Medical History:  Diagnosis Date  . Breast cancer ([place]) 03/2020  . Family history of throat cancer     SURGICAL HISTORY: Past Surgical History:  Procedure Laterality Date  . IR IMAGING GUIDED PORT INSERTION  03/30/2020    SOCIAL HISTORY: Social History   Socioeconomic History   . Marital status: Single    Spouse name: Not on file  . Number of children: Not on file  . Years of education: Not on file  . Highest education level: Not on file  Occupational History  . Not on file  Tobacco Use  . Smoking status: Current Every Day Smoker    Types: Cigarettes  . Smokeless tobacco: Never Used  . Tobacco comment: 7-8 cigs /day  Substance and Sexual Activity  . Alcohol use: Never  . Drug use: Never  . Sexual activity: Not on file  Other Topics Concern  . Not on file  Social History Narrative  . Not on file   Social Determinants of Health   Financial Resource Strain: Not on file  Food Insecurity: No Food Insecurity  . Worried About Charity fundraiser in the Last Year: Never true  . Ran Out of Food in the Last Year: Never true  Transportation Needs: No Transportation Needs  . Lack of Transportation (Medical): No  . Lack of Transportation (Non-Medical): No  Physical Activity: Not on file  Stress: Not on file  Social Connections: Not on file  Intimate Partner Violence: Not on file    FAMILY HISTORY: Family History  Problem Relation Age of Onset  . Throat cancer Maternal Uncle        dx >50, smoker  . Throat cancer Maternal Uncle        dx >50, smoker  . Cancer Cousin        unknown type, dx >50 (paternal first cousin)  . Cancer Cousin        unknown type (paternal first cousin)    Review of Systems  Constitutional: Positive for fatigue. Negative for appetite change, chills, fever and unexpected weight change.  HENT:   Negative for hearing loss, lump/mass and trouble swallowing.   Eyes: Negative for eye problems and icterus.  Respiratory: Negative for chest tightness, cough and shortness of breath.   Cardiovascular: Negative for chest pain, leg swelling and palpitations.  Gastrointestinal: Positive for nausea. Negative for abdominal distention, abdominal pain, constipation, diarrhea and vomiting.  Endocrine: Negative for hot flashes.   Genitourinary: Negative for difficulty urinating.   Musculoskeletal: Negative for arthralgias.  Skin: Negative for itching and rash.  Neurological: Negative for dizziness, extremity weakness, headaches and numbness.  Hematological: Negative for adenopathy. Does not bruise/bleed easily.  Psychiatric/Behavioral: Negative for depression. The patient is not nervous/anxious.       PHYSICAL EXAMINATION  ECOG PERFORMANCE STATUS: 1 - Symptomatic but completely ambulatory  Vitals:   06/10/20 1157  BP: (!) 120/56  Pulse: 91  Resp: 18  Temp: 97.7 F (36.5 C)  SpO2: 100%    Physical Exam Constitutional:      General: She is not in acute distress.    Appearance: Normal appearance. She is not toxic-appearing.  HENT:     Head: Normocephalic and atraumatic.  Eyes:     General: No scleral icterus. Cardiovascular:     Rate and Rhythm: Normal rate and regular rhythm.     Pulses: Normal pulses.     Heart sounds: Normal heart sounds.  Pulmonary:     Effort: Pulmonary effort is normal.  Breath sounds: Normal breath sounds.     Comments: Breast mass is softer, no progression noted  Abdominal:     General: Abdomen is flat. Bowel sounds are normal.     Palpations: Abdomen is soft.  Musculoskeletal:        General: No swelling.     Cervical back: Neck supple.  Skin:    General: Skin is warm and dry.     Capillary Refill: Capillary refill takes less than 2 seconds.     Findings: No rash.  Neurological:     General: No focal deficit present.     Mental Status: She is alert.  Psychiatric:        Mood and Affect: Mood normal.        Behavior: Behavior normal.     LABORATORY DATA:  CBC    Component Value Date/Time   WBC 4.4 06/10/2020 1124   WBC 5.4 02/05/2007 1044   RBC 3.29 (L) 06/10/2020 1124   HGB 9.5 (L) 06/10/2020 1124   HCT 29.2 (L) 06/10/2020 1124   PLT 328 06/10/2020 1124   MCV 88.8 06/10/2020 1124   MCH 28.9 06/10/2020 1124   MCHC 32.5 06/10/2020 1124   RDW  17.9 (H) 06/10/2020 1124   LYMPHSABS 0.7 06/10/2020 1124   MONOABS 0.9 06/10/2020 1124   EOSABS 0.1 06/10/2020 1124   BASOSABS 0.1 06/10/2020 1124    CMP     Component Value Date/Time   NA 142 06/10/2020 1124   K 3.6 06/10/2020 1124   CL 109 06/10/2020 1124   CO2 29 06/10/2020 1124   GLUCOSE 101 (H) 06/10/2020 1124   BUN 11 06/10/2020 1124   CREATININE 0.82 06/10/2020 1124   CALCIUM 9.1 06/10/2020 1124   PROT 7.1 06/10/2020 1124   ALBUMIN 3.4 (L) 06/10/2020 1124   AST 16 06/10/2020 1124   ALT 17 06/10/2020 1124   ALKPHOS 108 06/10/2020 1124   BILITOT <0.2 (L) 06/10/2020 1124   GFRNONAA >60 06/10/2020 1124   GFRAA  02/05/2007 1044    >60        The eGFR has been calculated using the MDRD equation. This calculation has not been validated in all clinical     ASSESSMENT and PLAN:   Malignant neoplasm of upper-inner quadrant of right breast in female, estrogen receptor positive ([place]) 01/2020:Palpable right breast mass growing since July 2021. Mammogram revealed 2.2 cm right breast mass with a 0.8 cm satellite lesion. 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  Treatment plan 1. Neoadjuvant chemotherapy with Adriamycin and Cytoxanpembrolizumab4 followed by Taxol weekly 12 with carboplatin pembrolizumabevery 3 weeks x4, followed by pembrolizumab maintenance to complete 1 year 2. Followed by breast conserving surgery with targeted axillary dissection 3. Followed by adjuvant radiation therapy -------------------------------------------------------------------------------------------------------------------------------------- Current treatment:Cycle 4 Adriamycin, Cytoxan, pembrolizumab Echocardiogram 04/06/2020: EF 65% Breast MRI 04/06/2020: Right breast: 2.8 cm mass, indeterminate 0.7 cm mass, bilobed mass in the right axilla 2.9 cm, left breast: 1 cm  mass (papilloma), indeterminate 0.9 cm mass  Chemo toxicities: 1.  Mild nausea 2. mild fatigue  She will proceed with treatment today.  Her mass is softer and smaller.  I reviewed her next chemotherapy regimen with Pemrolizumab, Carboplatin, Taxol.  I reviewed that the regimen is her best chance for cure, and encouraged her to continue on therapy.    She will return on 1/19 for labs, f/u  with Dr. Lindi Adie, and her next treatment.    All questions were answered. The patient knows to call the clinic with any problems, questions or concerns. We can certainly see the patient much sooner if necessary.  Total encounter time: 20 minutes*  Wilber Bihari, NP 06/12/20 2:21 PM Medical Oncology and Hematology [address] [place], [place] [address] Tel. [phone]    Fax. [phone]  *Total Encounter Time as defined by the Centers for Medicare and Medicaid Services includes, in addition to the face-to-face time of a patient visit (documented in the note above) non-face-to-face time: obtaining and reviewing outside history, ordering and reviewing medications, tests or procedures, care coordination (communications with other health care professionals or caregivers) and documentation in the medical record.

## 2020-06-11 ENCOUNTER — Telehealth: Payer: Self-pay | Admitting: Adult Health

## 2020-06-11 NOTE — Telephone Encounter (Signed)
Scheduled appts per 12/29 los. Called pt to go over appts. Pt stated she wanted to pick up an appt calendar at next visit versus going over the appts while we were one the phone. Added appt note for pt to get updated appt calendar.

## 2020-06-12 ENCOUNTER — Other Ambulatory Visit: Payer: Self-pay

## 2020-06-12 ENCOUNTER — Inpatient Hospital Stay: Payer: Medicaid Other

## 2020-06-12 ENCOUNTER — Encounter: Payer: Self-pay | Admitting: Adult Health

## 2020-06-12 VITALS — BP 95/55 | HR 99 | Temp 97.4°F | Resp 18

## 2020-06-12 DIAGNOSIS — Z170 Estrogen receptor positive status [ER+]: Secondary | ICD-10-CM

## 2020-06-12 DIAGNOSIS — Z5112 Encounter for antineoplastic immunotherapy: Secondary | ICD-10-CM | POA: Diagnosis not present

## 2020-06-12 DIAGNOSIS — C50211 Malignant neoplasm of upper-inner quadrant of right female breast: Secondary | ICD-10-CM

## 2020-06-12 MED ORDER — PEGFILGRASTIM-CBQV 6 MG/0.6ML ~~LOC~~ SOSY
6.0000 mg | PREFILLED_SYRINGE | Freq: Once | SUBCUTANEOUS | Status: AC
  Administered 2020-06-12: 6 mg via SUBCUTANEOUS

## 2020-06-15 ENCOUNTER — Telehealth: Payer: Self-pay | Admitting: Adult Health

## 2020-06-15 ENCOUNTER — Encounter: Payer: Self-pay | Admitting: *Deleted

## 2020-06-15 NOTE — Telephone Encounter (Signed)
Rescheduled appointments per 1/3 schedule message. Patient is aware of changes.

## 2020-06-24 ENCOUNTER — Other Ambulatory Visit: Payer: Self-pay

## 2020-06-24 ENCOUNTER — Ambulatory Visit: Payer: Self-pay

## 2020-06-24 ENCOUNTER — Ambulatory Visit: Payer: Self-pay | Admitting: Adult Health

## 2020-06-30 NOTE — Progress Notes (Signed)
Patient Care Team: Muse, Noel Journey., PA-C as PCP - General Oletta Lamas Garwin Brothers, RN as Oncology Nurse Navigator (Oncology) Mauro Kaufmann, RN as Oncology Nurse Navigator Rockwell Germany, RN as Oncology Nurse Navigator  DIAGNOSIS:    ICD-10-CM   1. Malignant neoplasm of upper-inner quadrant of right breast in female, estrogen receptor positive ([place])  C50.211    Z17.0     SUMMARY OF ONCOLOGIC HISTORY: Oncology History  Malignant neoplasm of upper-inner quadrant of right breast in female, estrogen receptor positive ([place])  01/2020 Initial Diagnosis   Palpable right breast mass growing since July 2021.  Mammogram revealed 2.2 cm right breast mass with a 0.8 cm satellite lesion.  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   03/31/2020 Cancer Staging   Staging form: Breast, AJCC 8th Edition - Clinical stage from 03/31/2020: Stage IIIB (cT2, cN1(f), cM0, G3, ER-, PR-, HER2-) - Signed by Nicholas Lose, MD on 04/02/2020   04/09/2020 -  Chemotherapy    Patient is on Treatment Plan: BREAST AC Q21D / CARBOPLATIN D1 + PACLITAXEL D1,8,15 Q21D        CHIEF COMPLIANT: Cycle 1Taxol, Carboplatin, and Keytruda  INTERVAL HISTORY: Sharon Fischer is a 55 y.o. with above-mentioned history of right breast cancer currently on neoadjuvant chemotherapy with weekly Taxol and Carboplatin and Keytruda every 3 weeks..She presents to the clinic todayfora toxicity check andcycle 1.    Her major complaint today is profound fatigue.  She does have shortness of breath to exertion.  She denies any dizziness or lightheadedness.  She has had cough without any productive sputum denies any fevers or chills.  ALLERGIES:  has No Known Allergies.  MEDICATIONS:  Current Outpatient Medications  Medication Sig Dispense Refill  . atorvastatin (LIPITOR) 40 MG tablet Take by mouth.    . ciprofloxacin  (CIPRO) 500 MG tablet Take 1 tablet (500 mg total) by mouth 2 (two) times daily. 10 tablet 0  . fluticasone (FLONASE) 50 MCG/ACT nasal spray Place into both nostrils.    Marland Kitchen lidocaine-prilocaine (EMLA) cream Apply to affected area once 30 g 3  . lisinopril-hydrochlorothiazide (ZESTORETIC) 20-12.5 MG tablet Take 1 tablet by mouth daily.    Marland Kitchen LORazepam (ATIVAN) 0.5 MG tablet Take 1 tablet (0.5 mg total) by mouth at bedtime as needed for sleep. 30 tablet 0  . ondansetron (ZOFRAN) 8 MG tablet Take 1 tablet (8 mg total) by mouth 2 (two) times daily as needed. Start on the third day after chemotherapy. 30 tablet 1  . oxybutynin (DITROPAN-XL) 5 MG 24 hr tablet Take 1 tablet (5 mg total) by mouth at bedtime. 30 tablet 1  . prochlorperazine (COMPAZINE) 10 MG tablet Take 1 tablet (10 mg total) by mouth every 6 (six) hours as needed (Nausea or vomiting). 30 tablet 1   No current facility-administered medications for this visit.    PHYSICAL EXAMINATION: ECOG PERFORMANCE STATUS: 1 - Symptomatic but completely ambulatory  Vitals:   07/01/20 1154  BP: (!) 101/53  Pulse: (!) 118  Resp: 16  Temp: 97.9 F (36.6 C)  SpO2: 97%   Filed Weights   07/01/20 1154  Weight: 168 lb (76.2 kg)     LABORATORY DATA:  I have reviewed the data as listed CMP Latest Ref Rng & Units 06/10/2020 05/20/2020 04/29/2020  Glucose 70 - 99 mg/dL 101(H) 105(H) 107(H)  BUN 6 - 20  mg/dL _0 Creatinine 0.44 - 1.00 mg/dL 0.82 0.88 0.79  Sodium 135 - 145 mmol/L 142 142 141  Potassium 3.5 - 5.1 mmol/L 3.6 3.8 3.5  Chloride 98 - 111 mmol/L 109 108 107  CO2 22 - 32 mmol/L _1 Calcium 8.9 - 10.3 mg/dL 9.1 9.4 9.2  Total Protein 6.5 - 8.1 g/dL 7.1 7.0 7.4  Total Bilirubin 0.3 - 1.2 mg/dL <0.2(L) 0.3 <0.2(L)  Alkaline Phos 38 - 126 U/L 108 124 128(H)  AST 15 - 41 U/L _2 ALT 0 - 44 U/L _3 Lab Results  Component Value Date   WBC 10.0 07/01/2020   HGB 8.6 (L) 07/01/2020   HCT 26.1 (L) 07/01/2020    MCV 88.2 07/01/2020   PLT 278 07/01/2020   NEUTROABS 7.9 (H) 07/01/2020    ASSESSMENT & PLAN:  Malignant neoplasm of upper-inner quadrant of right breast in female, estrogen receptor positive ([place]) 01/2020:Palpable right breast mass growing since July 2021. Mammogram revealed 2.2 cm right breast mass with a 0.8 cm satellite lesion. 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  Treatment plan 1. Neoadjuvant chemotherapy with Adriamycin and Cytoxanpembrolizumab4 followed by Taxol weekly 12 with carboplatin pembrolizumabevery 3 weeks x4, followed by pembrolizumab maintenance to complete 1 year 2. Followed by breast conserving surgery with targeted axillary dissection 3. Followed by adjuvant radiation therapy -------------------------------------------------------------------------------------------------------------------------------------- Current treatment:Completed 4 cycles ofAdriamycin, Cytoxan, pembrolizumab, today is cycle 1 Taxol carboplatin and pembrolizumab  Echocardiogram 04/06/2020: EF 65% Breast MRI 04/06/2020: Right breast: 2.8 cm mass, indeterminate 0.7 cm mass, bilobed mass in the right axilla 2.9 cm, left breast: 1 cm mass (papilloma), indeterminate 0.9 cm mass  Chemo toxicities: 1.Mild nausea 2.mild fatigue 3.  Diarrhea: Causing tachycardia and hypokalemia, encouraged her potassium containing foods and measures to stop the diarrhea. 4.  Severe anemia: Hemoglobin today is 8.6: Monitoring closely.  This is also partially the cause of her tachycardia. If she gets worse or if hemoglobin gets below 8 then we will transfuse her PRBC.  Return to clinic every week for Taxol.  I will see the patient every other week for follow-up     No orders of the defined types were placed in this encounter.  The patient has a good understanding of the overall  plan. she agrees with it. she will call with any problems that may develop before the next visit here.  Total time spent: 30 mins including face to face time and time spent for planning, charting and coordination of care  Gardenia Phlegm* 07/01/2020  I, Cloyde Reams Dorshimer, am acting as scribe for Dr. Nicholas Lose.  I have reviewed the above documentation for accuracy and completeness, and I agree with the above.

## 2020-06-30 NOTE — Assessment & Plan Note (Signed)
01/2020:Palpable right breast mass growing since July 2021. Mammogram revealed 2.2 cm right breast mass with a 0.8 cm satellite lesion. 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  Treatment plan 1. Neoadjuvant chemotherapy with Adriamycin and Cytoxanpembrolizumab4 followed by Taxol weekly 12 with carboplatin pembrolizumabevery 3 weeks x4, followed by pembrolizumab maintenance to complete 1 year 2. Followed by breast conserving surgery with targeted axillary dissection 3. Followed by adjuvant radiation therapy -------------------------------------------------------------------------------------------------------------------------------------- Current treatment:Completed 4 cycles ofAdriamycin, Cytoxan, pembrolizumab, today is cycle 1 Taxol carboplatin and pembrolizumab  Echocardiogram 04/06/2020: EF 65% Breast MRI 04/06/2020: Right breast: 2.8 cm mass, indeterminate 0.7 cm mass, bilobed mass in the right axilla 2.9 cm, left breast: 1 cm mass (papilloma), indeterminate 0.9 cm mass  Chemo toxicities: 1.Mild nausea 2.mild fatigue  Return to clinic every week for Taxol.  I will see the patient every other week for follow-up

## 2020-07-01 ENCOUNTER — Other Ambulatory Visit: Payer: Self-pay

## 2020-07-01 ENCOUNTER — Encounter: Payer: Self-pay | Admitting: *Deleted

## 2020-07-01 ENCOUNTER — Inpatient Hospital Stay: Payer: Medicaid Other

## 2020-07-01 ENCOUNTER — Inpatient Hospital Stay (HOSPITAL_BASED_OUTPATIENT_CLINIC_OR_DEPARTMENT_OTHER): Payer: Medicaid Other | Admitting: Hematology and Oncology

## 2020-07-01 ENCOUNTER — Inpatient Hospital Stay: Payer: Medicaid Other | Attending: Hematology and Oncology

## 2020-07-01 VITALS — BP 109/52 | HR 93 | Temp 98.8°F | Resp 17

## 2020-07-01 DIAGNOSIS — Z5112 Encounter for antineoplastic immunotherapy: Secondary | ICD-10-CM | POA: Diagnosis present

## 2020-07-01 DIAGNOSIS — Z95828 Presence of other vascular implants and grafts: Secondary | ICD-10-CM

## 2020-07-01 DIAGNOSIS — Z170 Estrogen receptor positive status [ER+]: Secondary | ICD-10-CM

## 2020-07-01 DIAGNOSIS — Z79899 Other long term (current) drug therapy: Secondary | ICD-10-CM | POA: Diagnosis not present

## 2020-07-01 DIAGNOSIS — C50211 Malignant neoplasm of upper-inner quadrant of right female breast: Secondary | ICD-10-CM

## 2020-07-01 DIAGNOSIS — Z5111 Encounter for antineoplastic chemotherapy: Secondary | ICD-10-CM | POA: Diagnosis present

## 2020-07-01 DIAGNOSIS — D649 Anemia, unspecified: Secondary | ICD-10-CM | POA: Insufficient documentation

## 2020-07-01 LAB — CBC WITH DIFFERENTIAL (CANCER CENTER ONLY)
Abs Immature Granulocytes: 0.05 10*3/uL (ref 0.00–0.07)
Basophils Absolute: 0 10*3/uL (ref 0.0–0.1)
Basophils Relative: 0 %
Eosinophils Absolute: 0 10*3/uL (ref 0.0–0.5)
Eosinophils Relative: 0 %
HCT: 26.1 % — ABNORMAL LOW (ref 36.0–46.0)
Hemoglobin: 8.6 g/dL — ABNORMAL LOW (ref 12.0–15.0)
Immature Granulocytes: 1 %
Lymphocytes Relative: 7 %
Lymphs Abs: 0.7 10*3/uL (ref 0.7–4.0)
MCH: 29.1 pg (ref 26.0–34.0)
MCHC: 33 g/dL (ref 30.0–36.0)
MCV: 88.2 fL (ref 80.0–100.0)
Monocytes Absolute: 1.4 10*3/uL — ABNORMAL HIGH (ref 0.1–1.0)
Monocytes Relative: 14 %
Neutro Abs: 7.9 10*3/uL — ABNORMAL HIGH (ref 1.7–7.7)
Neutrophils Relative %: 78 %
Platelet Count: 278 10*3/uL (ref 150–400)
RBC: 2.96 MIL/uL — ABNORMAL LOW (ref 3.87–5.11)
RDW: 17.6 % — ABNORMAL HIGH (ref 11.5–15.5)
WBC Count: 10 10*3/uL (ref 4.0–10.5)
nRBC: 0 % (ref 0.0–0.2)

## 2020-07-01 LAB — CMP (CANCER CENTER ONLY)
ALT: 23 U/L (ref 0–44)
AST: 28 U/L (ref 15–41)
Albumin: 3.4 g/dL — ABNORMAL LOW (ref 3.5–5.0)
Alkaline Phosphatase: 105 U/L (ref 38–126)
Anion gap: 12 (ref 5–15)
BUN: 14 mg/dL (ref 6–20)
CO2: 24 mmol/L (ref 22–32)
Calcium: 9 mg/dL (ref 8.9–10.3)
Chloride: 103 mmol/L (ref 98–111)
Creatinine: 0.91 mg/dL (ref 0.44–1.00)
GFR, Estimated: >60 mL/min (ref >60)
Glucose, Bld: 118 mg/dL — ABNORMAL HIGH (ref 70–99)
Potassium: 3 mmol/L — ABNORMAL LOW (ref 3.5–5.1)
Sodium: 139 mmol/L (ref 135–145)
Total Bilirubin: 0.6 mg/dL (ref 0.3–1.2)
Total Protein: 7.6 g/dL (ref 6.5–8.1)

## 2020-07-01 LAB — TSH: TSH: 0.276 u[IU]/mL — ABNORMAL LOW (ref 0.308–3.960)

## 2020-07-01 MED ORDER — SODIUM CHLORIDE 0.9 % IV SOLN
700.0000 mg | Freq: Once | INTRAVENOUS | Status: AC
Start: 2020-07-01 — End: 2020-07-01
  Administered 2020-07-01: 700 mg via INTRAVENOUS
  Filled 2020-07-01: qty 70

## 2020-07-01 MED ORDER — FAMOTIDINE IN NACL 20-0.9 MG/50ML-% IV SOLN
INTRAVENOUS | Status: AC
  Filled 2020-07-01: qty 50

## 2020-07-01 MED ORDER — FAMOTIDINE IN NACL 20-0.9 MG/50ML-% IV SOLN
20.0000 mg | Freq: Once | INTRAVENOUS | Status: AC
  Administered 2020-07-01: 20 mg via INTRAVENOUS

## 2020-07-01 MED ORDER — SODIUM CHLORIDE 0.9 % IV SOLN
150.0000 mg | Freq: Once | INTRAVENOUS | Status: AC
  Administered 2020-07-01: 150 mg via INTRAVENOUS
  Filled 2020-07-01: qty 150

## 2020-07-01 MED ORDER — PALONOSETRON HCL INJECTION 0.25 MG/5ML
INTRAVENOUS | Status: AC
  Filled 2020-07-01: qty 5

## 2020-07-01 MED ORDER — SODIUM CHLORIDE 0.9% FLUSH
10.0000 mL | Freq: Once | INTRAVENOUS | Status: AC
  Administered 2020-07-01: 10 mL
  Filled 2020-07-01: qty 10

## 2020-07-01 MED ORDER — SODIUM CHLORIDE 0.9% FLUSH
10.0000 mL | INTRAVENOUS | Status: DC | PRN
  Administered 2020-07-01: 10 mL
  Filled 2020-07-01: qty 10

## 2020-07-01 MED ORDER — SODIUM CHLORIDE 0.9 % IV SOLN
Freq: Once | INTRAVENOUS | Status: AC
  Filled 2020-07-01: qty 250

## 2020-07-01 MED ORDER — PALONOSETRON HCL INJECTION 0.25 MG/5ML
0.2500 mg | Freq: Once | INTRAVENOUS | Status: AC
  Administered 2020-07-01: 0.25 mg via INTRAVENOUS

## 2020-07-01 MED ORDER — SODIUM CHLORIDE 0.9 % IV SOLN
80.0000 mg/m2 | Freq: Once | INTRAVENOUS | Status: AC
  Administered 2020-07-01: 162 mg via INTRAVENOUS
  Filled 2020-07-01: qty 27

## 2020-07-01 MED ORDER — DIPHENHYDRAMINE HCL 50 MG/ML IJ SOLN
50.0000 mg | Freq: Once | INTRAMUSCULAR | Status: AC
  Administered 2020-07-01: 50 mg via INTRAVENOUS

## 2020-07-01 MED ORDER — DIPHENHYDRAMINE HCL 50 MG/ML IJ SOLN
INTRAMUSCULAR | Status: AC
  Filled 2020-07-01: qty 1

## 2020-07-01 MED ORDER — HEPARIN SOD (PORK) LOCK FLUSH 100 UNIT/ML IV SOLN
500.0000 [IU] | Freq: Once | INTRAVENOUS | Status: AC | PRN
  Administered 2020-07-01: 500 [IU]
  Filled 2020-07-01: qty 5

## 2020-07-01 MED ORDER — SODIUM CHLORIDE 0.9 % IV SOLN
200.0000 mg | Freq: Once | INTRAVENOUS | Status: AC
  Administered 2020-07-01: 200 mg via INTRAVENOUS
  Filled 2020-07-01: qty 8

## 2020-07-01 NOTE — Patient Instructions (Addendum)
[facility] Discharge Instructions for Patients Receiving Chemotherapy  Today you received the following chemotherapy agents: Keytruda, taxol, carboplatin  To help prevent nausea and vomiting after your treatment, we encourage you to take your nausea medication as directed.   If you develop nausea and vomiting that is not controlled by your nausea medication, call the clinic.   BELOW ARE SYMPTOMS THAT SHOULD BE REPORTED IMMEDIATELY:  *FEVER GREATER THAN 100.5 F  *CHILLS WITH OR WITHOUT FEVER  NAUSEA AND VOMITING THAT IS NOT CONTROLLED WITH YOUR NAUSEA MEDICATION  *UNUSUAL SHORTNESS OF BREATH  *UNUSUAL BRUISING OR BLEEDING  TENDERNESS IN MOUTH AND THROAT WITH OR WITHOUT PRESENCE OF ULCERS  *URINARY PROBLEMS  *BOWEL PROBLEMS  UNUSUAL RASH Items with * indicate a potential emergency and should be followed up as soon as possible.  Feel free to call the clinic should you have any questions or concerns. The clinic phone number is [phone].  Please show the [facility] at check-in to the Emergency Department and triage nurse.  Paclitaxel injection What is this medicine? PACLITAXEL (PAK li TAX el) is a chemotherapy drug. It targets fast dividing cells, like cancer cells, and causes these cells to die. This medicine is used to treat ovarian cancer, breast cancer, lung cancer, Kaposi's sarcoma, and other cancers. This medicine may be used for other purposes; ask your health care provider or pharmacist if you have questions. COMMON BRAND NAME(S): Onxol, Taxol What should I tell my health care provider before I take this medicine? They need to know if you have any of these conditions:  history of irregular heartbeat  liver disease  low blood counts, like low white cell, platelet, or red cell counts  lung or breathing disease, like asthma  tingling of the fingers or toes, or other nerve disorder  an unusual or allergic reaction to paclitaxel, alcohol,  polyoxyethylated castor oil, other chemotherapy, other medicines, foods, dyes, or preservatives  pregnant or trying to get pregnant  breast-feeding How should I use this medicine? This drug is given as an infusion into a vein. It is administered in a hospital or clinic by a specially trained health care professional. Talk to your pediatrician regarding the use of this medicine in children. Special care may be needed. Overdosage: If you think you have taken too much of this medicine contact a poison control center or emergency room at once. NOTE: This medicine is only for you. Do not share this medicine with others. What if I miss a dose? It is important not to miss your dose. Call your doctor or health care professional if you are unable to keep an appointment. What may interact with this medicine? Do not take this medicine with any of the following medications:  live virus vaccines This medicine may also interact with the following medications:  antiviral medicines for hepatitis, HIV or AIDS  certain antibiotics like erythromycin and clarithromycin  certain medicines for fungal infections like ketoconazole and itraconazole  certain medicines for seizures like carbamazepine, phenobarbital, phenytoin  gemfibrozil  nefazodone  rifampin  St. John's wort This list may not describe all possible interactions. Give your health care provider a list of all the medicines, herbs, non-prescription drugs, or dietary supplements you use. Also tell them if you smoke, drink alcohol, or use illegal drugs. Some items may interact with your medicine. What should I watch for while using this medicine? Your condition will be monitored carefully while you are receiving this medicine. You will need important  blood work done while you are taking this medicine. This medicine can cause serious allergic reactions. To reduce your risk you will need to take other medicine(s) before treatment with this  medicine. If you experience allergic reactions like skin rash, itching or hives, swelling of the face, lips, or tongue, tell your doctor or health care professional right away. In some cases, you may be given additional medicines to help with side effects. Follow all directions for their use. This drug may make you feel generally unwell. This is not uncommon, as chemotherapy can affect healthy cells as well as cancer cells. Report any side effects. Continue your course of treatment even though you feel ill unless your doctor tells you to stop. Call your doctor or health care professional for advice if you get a fever, chills or sore throat, or other symptoms of a cold or flu. Do not treat yourself. This drug decreases your body's ability to fight infections. Try to avoid being around people who are sick. This medicine may increase your risk to bruise or bleed. Call your doctor or health care professional if you notice any unusual bleeding. Be careful brushing and flossing your teeth or using a toothpick because you may get an infection or bleed more easily. If you have any dental work done, tell your dentist you are receiving this medicine. Avoid taking products that contain aspirin, acetaminophen, ibuprofen, naproxen, or ketoprofen unless instructed by your doctor. These medicines may hide a fever. Do not become pregnant while taking this medicine. Women should inform their doctor if they wish to become pregnant or think they might be pregnant. There is a potential for serious side effects to an unborn child. Talk to your health care professional or pharmacist for more information. Do not breast-feed an infant while taking this medicine. Men are advised not to father a child while receiving this medicine. This product may contain alcohol. Ask your pharmacist or healthcare provider if this medicine contains alcohol. Be sure to tell all healthcare providers you are taking this medicine. Certain medicines,  like metronidazole and disulfiram, can cause an unpleasant reaction when taken with alcohol. The reaction includes flushing, headache, nausea, vomiting, sweating, and increased thirst. The reaction can last from 30 minutes to several hours. What side effects may I notice from receiving this medicine? Side effects that you should report to your doctor or health care professional as soon as possible:  allergic reactions like skin rash, itching or hives, swelling of the face, lips, or tongue  breathing problems  changes in vision  fast, irregular heartbeat  high or low blood pressure  mouth sores  pain, tingling, numbness in the hands or feet  signs of decreased platelets or bleeding - bruising, pinpoint red spots on the skin, black, tarry stools, blood in the urine  signs of decreased red blood cells - unusually weak or tired, feeling faint or lightheaded, falls  signs of infection - fever or chills, cough, sore throat, pain or difficulty passing urine  signs and symptoms of liver injury like dark yellow or brown urine; general ill feeling or flu-like symptoms; light-colored stools; loss of appetite; nausea; right upper belly pain; unusually weak or tired; yellowing of the eyes or skin  swelling of the ankles, feet, hands  unusually slow heartbeat Side effects that usually do not require medical attention (report to your doctor or health care professional if they continue or are bothersome):  diarrhea  hair loss  loss of appetite  muscle or joint   pain  nausea, vomiting  pain, redness, or irritation at site where injected  tiredness This list may not describe all possible side effects. Call your doctor for medical advice about side effects. You may report side effects to FDA at 1-800-FDA-1088. Where should I keep my medicine? This drug is given in a hospital or clinic and will not be stored at home. NOTE: This sheet is a summary. It may not cover all possible information.  If you have questions about this medicine, talk to your doctor, pharmacist, or health care provider.  2021 Elsevier/Gold Standard (2019-05-01 13:37:23)  Carboplatin injection What is this medicine? CARBOPLATIN (KAR boe pla tin) is a chemotherapy drug. It targets fast dividing cells, like cancer cells, and causes these cells to die. This medicine is used to treat ovarian cancer and many other cancers. This medicine may be used for other purposes; ask your health care provider or pharmacist if you have questions. COMMON BRAND NAME(S): Paraplatin What should I tell my health care provider before I take this medicine? They need to know if you have any of these conditions:  blood disorders  hearing problems  kidney disease  recent or ongoing radiation therapy  an unusual or allergic reaction to carboplatin, cisplatin, other chemotherapy, other medicines, foods, dyes, or preservatives  pregnant or trying to get pregnant  breast-feeding How should I use this medicine? This drug is usually given as an infusion into a vein. It is administered in a hospital or clinic by a specially trained health care professional. Talk to your pediatrician regarding the use of this medicine in children. Special care may be needed. Overdosage: If you think you have taken too much of this medicine contact a poison control center or emergency room at once. NOTE: This medicine is only for you. Do not share this medicine with others. What if I miss a dose? It is important not to miss a dose. Call your doctor or health care professional if you are unable to keep an appointment. What may interact with this medicine?  medicines for seizures  medicines to increase blood counts like filgrastim, pegfilgrastim, sargramostim  some antibiotics like amikacin, gentamicin, neomycin, streptomycin, tobramycin  vaccines Talk to your doctor or health care professional before taking any of these  medicines:  acetaminophen  aspirin  ibuprofen  ketoprofen  naproxen This list may not describe all possible interactions. Give your health care provider a list of all the medicines, herbs, non-prescription drugs, or dietary supplements you use. Also tell them if you smoke, drink alcohol, or use illegal drugs. Some items may interact with your medicine. What should I watch for while using this medicine? Your condition will be monitored carefully while you are receiving this medicine. You will need important blood work done while you are taking this medicine. This drug may make you feel generally unwell. This is not uncommon, as chemotherapy can affect healthy cells as well as cancer cells. Report any side effects. Continue your course of treatment even though you feel ill unless your doctor tells you to stop. In some cases, you may be given additional medicines to help with side effects. Follow all directions for their use. Call your doctor or health care professional for advice if you get a fever, chills or sore throat, or other symptoms of a cold or flu. Do not treat yourself. This drug decreases your body's ability to fight infections. Try to avoid being around people who are sick. This medicine may increase your risk to bruise  or bleed. Call your doctor or health care professional if you notice any unusual bleeding. Be careful brushing and flossing your teeth or using a toothpick because you may get an infection or bleed more easily. If you have any dental work done, tell your dentist you are receiving this medicine. Avoid taking products that contain aspirin, acetaminophen, ibuprofen, naproxen, or ketoprofen unless instructed by your doctor. These medicines may hide a fever. Do not become pregnant while taking this medicine. Women should inform their doctor if they wish to become pregnant or think they might be pregnant. There is a potential for serious side effects to an unborn child. Talk  to your health care professional or pharmacist for more information. Do not breast-feed an infant while taking this medicine. What side effects may I notice from receiving this medicine? Side effects that you should report to your doctor or health care professional as soon as possible:  allergic reactions like skin rash, itching or hives, swelling of the face, lips, or tongue  signs of infection - fever or chills, cough, sore throat, pain or difficulty passing urine  signs of decreased platelets or bleeding - bruising, pinpoint red spots on the skin, black, tarry stools, nosebleeds  signs of decreased red blood cells - unusually weak or tired, fainting spells, lightheadedness  breathing problems  changes in hearing  changes in vision  chest pain  high blood pressure  low blood counts - This drug may decrease the number of white blood cells, red blood cells and platelets. You may be at increased risk for infections and bleeding.  nausea and vomiting  pain, swelling, redness or irritation at the injection site  pain, tingling, numbness in the hands or feet  problems with balance, talking, walking  trouble passing urine or change in the amount of urine Side effects that usually do not require medical attention (report to your doctor or health care professional if they continue or are bothersome):  hair loss  loss of appetite  metallic taste in the mouth or changes in taste This list may not describe all possible side effects. Call your doctor for medical advice about side effects. You may report side effects to FDA at 1-800-FDA-1088. Where should I keep my medicine? This drug is given in a hospital or clinic and will not be stored at home. NOTE: This sheet is a summary. It may not cover all possible information. If you have questions about this medicine, talk to your doctor, pharmacist, or health care provider.  2021 Elsevier/Gold Standard (2007-09-04 14:38:05)

## 2020-07-01 NOTE — Patient Instructions (Signed)

## 2020-07-01 NOTE — Progress Notes (Signed)
Okay to treat with pulse 106 and K+ 3.0 per Dr. Lindi Adie. Patient will increase potassium rich foods in her diet.

## 2020-07-02 ENCOUNTER — Telehealth: Payer: Self-pay | Admitting: Hematology and Oncology

## 2020-07-02 NOTE — Telephone Encounter (Signed)
No 11/9 los, no changes made to pt schedule  

## 2020-07-08 ENCOUNTER — Other Ambulatory Visit: Payer: Self-pay

## 2020-07-08 ENCOUNTER — Inpatient Hospital Stay: Payer: Medicaid Other

## 2020-07-08 VITALS — BP 113/63 | HR 103 | Temp 98.7°F | Resp 18

## 2020-07-08 DIAGNOSIS — Z170 Estrogen receptor positive status [ER+]: Secondary | ICD-10-CM

## 2020-07-08 DIAGNOSIS — D649 Anemia, unspecified: Secondary | ICD-10-CM

## 2020-07-08 DIAGNOSIS — C50211 Malignant neoplasm of upper-inner quadrant of right female breast: Secondary | ICD-10-CM

## 2020-07-08 DIAGNOSIS — Z95828 Presence of other vascular implants and grafts: Secondary | ICD-10-CM

## 2020-07-08 DIAGNOSIS — Z5112 Encounter for antineoplastic immunotherapy: Secondary | ICD-10-CM | POA: Diagnosis not present

## 2020-07-08 LAB — CBC WITH DIFFERENTIAL (CANCER CENTER ONLY)
Abs Immature Granulocytes: 0.05 10*3/uL (ref 0.00–0.07)
Basophils Absolute: 0 10*3/uL (ref 0.0–0.1)
Basophils Relative: 1 %
Eosinophils Absolute: 0.1 10*3/uL (ref 0.0–0.5)
Eosinophils Relative: 1 %
HCT: 22.4 % — ABNORMAL LOW (ref 36.0–46.0)
Hemoglobin: 7.4 g/dL — ABNORMAL LOW (ref 12.0–15.0)
Immature Granulocytes: 1 %
Lymphocytes Relative: 8 %
Lymphs Abs: 0.4 10*3/uL — ABNORMAL LOW (ref 0.7–4.0)
MCH: 29.1 pg (ref 26.0–34.0)
MCHC: 33 g/dL (ref 30.0–36.0)
MCV: 88.2 fL (ref 80.0–100.0)
Monocytes Absolute: 0.3 10*3/uL (ref 0.1–1.0)
Monocytes Relative: 5 %
Neutro Abs: 4.5 10*3/uL (ref 1.7–7.7)
Neutrophils Relative %: 84 %
Platelet Count: 213 10*3/uL (ref 150–400)
RBC: 2.54 MIL/uL — ABNORMAL LOW (ref 3.87–5.11)
RDW: 16.8 % — ABNORMAL HIGH (ref 11.5–15.5)
WBC Count: 5.3 10*3/uL (ref 4.0–10.5)
nRBC: 0 % (ref 0.0–0.2)

## 2020-07-08 LAB — SAMPLE TO BLOOD BANK

## 2020-07-08 LAB — CMP (CANCER CENTER ONLY)
ALT: 43 U/L (ref 0–44)
AST: 42 U/L — ABNORMAL HIGH (ref 15–41)
Albumin: 3.3 g/dL — ABNORMAL LOW (ref 3.5–5.0)
Alkaline Phosphatase: 103 U/L (ref 38–126)
Anion gap: 11 (ref 5–15)
BUN: 18 mg/dL (ref 6–20)
CO2: 28 mmol/L (ref 22–32)
Calcium: 9.3 mg/dL (ref 8.9–10.3)
Chloride: 98 mmol/L (ref 98–111)
Creatinine: 0.96 mg/dL (ref 0.44–1.00)
GFR, Estimated: >60 mL/min (ref >60)
Glucose, Bld: 112 mg/dL — ABNORMAL HIGH (ref 70–99)
Potassium: 3.6 mmol/L (ref 3.5–5.1)
Sodium: 137 mmol/L (ref 135–145)
Total Bilirubin: 0.5 mg/dL (ref 0.3–1.2)
Total Protein: 7.5 g/dL (ref 6.5–8.1)

## 2020-07-08 LAB — ABO/RH: ABO/RH(D): B POS

## 2020-07-08 LAB — PREPARE RBC (CROSSMATCH)

## 2020-07-08 MED ORDER — FAMOTIDINE IN NACL 20-0.9 MG/50ML-% IV SOLN
20.0000 mg | Freq: Once | INTRAVENOUS | Status: AC
  Administered 2020-07-08: 20 mg via INTRAVENOUS

## 2020-07-08 MED ORDER — DIPHENHYDRAMINE HCL 25 MG PO CAPS
ORAL_CAPSULE | ORAL | Status: AC
  Filled 2020-07-08: qty 1

## 2020-07-08 MED ORDER — SODIUM CHLORIDE 0.9 % IV SOLN
80.0000 mg/m2 | Freq: Once | INTRAVENOUS | Status: AC
  Administered 2020-07-08: 162 mg via INTRAVENOUS
  Filled 2020-07-08: qty 27

## 2020-07-08 MED ORDER — DIPHENHYDRAMINE HCL 50 MG/ML IJ SOLN
INTRAMUSCULAR | Status: AC
  Filled 2020-07-08: qty 1

## 2020-07-08 MED ORDER — DENOSUMAB 120 MG/1.7ML ~~LOC~~ SOLN
SUBCUTANEOUS | Status: AC
  Filled 2020-07-08: qty 1.7

## 2020-07-08 MED ORDER — SODIUM CHLORIDE 0.9 % IV SOLN
Freq: Once | INTRAVENOUS | Status: AC
  Filled 2020-07-08: qty 250

## 2020-07-08 MED ORDER — EPOETIN ALFA-EPBX 10000 UNIT/ML IJ SOLN
INTRAMUSCULAR | Status: AC
  Filled 2020-07-08: qty 2

## 2020-07-08 MED ORDER — ACETAMINOPHEN 325 MG PO TABS
ORAL_TABLET | ORAL | Status: AC
  Filled 2020-07-08: qty 2

## 2020-07-08 MED ORDER — FAMOTIDINE IN NACL 20-0.9 MG/50ML-% IV SOLN
INTRAVENOUS | Status: AC
  Filled 2020-07-08: qty 50

## 2020-07-08 MED ORDER — SODIUM CHLORIDE 0.9% FLUSH
10.0000 mL | INTRAVENOUS | Status: DC | PRN
  Administered 2020-07-08: 10 mL
  Filled 2020-07-08: qty 10

## 2020-07-08 MED ORDER — DIPHENHYDRAMINE HCL 50 MG/ML IJ SOLN
50.0000 mg | Freq: Once | INTRAMUSCULAR | Status: AC
Start: 2020-07-08 — End: 2020-07-08
  Administered 2020-07-08: 50 mg via INTRAVENOUS

## 2020-07-08 MED ORDER — SODIUM CHLORIDE 0.9% FLUSH
10.0000 mL | Freq: Once | INTRAVENOUS | Status: AC
  Administered 2020-07-08: 10 mL
  Filled 2020-07-08: qty 10

## 2020-07-08 MED ORDER — HEPARIN SOD (PORK) LOCK FLUSH 100 UNIT/ML IV SOLN
500.0000 [IU] | Freq: Once | INTRAVENOUS | Status: AC | PRN
  Administered 2020-07-08: 500 [IU]
  Filled 2020-07-08: qty 5

## 2020-07-08 NOTE — Progress Notes (Signed)
Patient discharged in stable condition. Vital signs rechecked. Patient reported that she felt fine enough to ambulate without assistance. Patient reminded to keep blood bracelet on for her appointment tomorrow and was provided an updated schedule.

## 2020-07-08 NOTE — Patient Instructions (Addendum)
[facility] Discharge Instructions for Patients Receiving Chemotherapy  Today you received the following chemotherapy agents: Paclitaxel (Taxol)   To help prevent nausea and vomiting after your treatment, we encourage you to take your nausea medication  as prescribed.    If you develop nausea and vomiting that is not controlled by your nausea medication, call the clinic.   BELOW ARE SYMPTOMS THAT SHOULD BE REPORTED IMMEDIATELY:  *FEVER GREATER THAN 100.5 F  *CHILLS WITH OR WITHOUT FEVER  NAUSEA AND VOMITING THAT IS NOT CONTROLLED WITH YOUR NAUSEA MEDICATION  *UNUSUAL SHORTNESS OF BREATH  *UNUSUAL BRUISING OR BLEEDING  TENDERNESS IN MOUTH AND THROAT WITH OR WITHOUT PRESENCE OF ULCERS  *URINARY PROBLEMS  *BOWEL PROBLEMS  UNUSUAL RASH Items with * indicate a potential emergency and should be followed up as soon as possible.  Feel free to call the clinic should you have any questions or concerns. The clinic phone number is [phone].  Please show the [facility] at check-in to the Emergency Department and triage nurse.   [employer] medicine (25th ed., pp. [phone]). [place], [place]: Elsevier.">  Anemia  Anemia is a condition in which there is not enough red blood cells or hemoglobin in the blood. Hemoglobin is a substance in red blood cells that carries oxygen. When you do not have enough red blood cells or hemoglobin (are anemic), your body cannot get enough oxygen and your organs may not work properly. As a result, you may feel very tired or have other problems. What are the causes? Common causes of anemia include:  Excessive bleeding. Anemia can be caused by excessive bleeding inside or outside the body, including bleeding from the intestines or from heavy menstrual periods in females.  Poor nutrition.  Long-lasting (chronic) kidney, thyroid, and liver disease.  Bone marrow disorders, spleen problems, and blood disorders.  Cancer and  treatments for cancer.  HIV (human immunodeficiency virus) and AIDS (acquired immunodeficiency syndrome).  Infections, medicines, and autoimmune disorders that destroy red blood cells. What are the signs or symptoms? Symptoms of this condition include:  Minor weakness.  Dizziness.  Headache, or difficulties concentrating and sleeping.  Heartbeats that feel irregular or faster than normal (palpitations).  Shortness of breath, especially with exercise.  Pale skin, lips, and nails, or cold hands and feet.  Indigestion and nausea. Symptoms may occur suddenly or develop slowly. If your anemia is mild, you may not have symptoms. How is this diagnosed? This condition is diagnosed based on blood tests, your medical history, and a physical exam. In some cases, a test may be needed in which cells are removed from the soft tissue inside of a bone and looked at under a microscope (bone marrow biopsy). Your health care provider may also check your stool (feces) for blood and may do additional testing to look for the cause of your bleeding. Other tests may include:  Imaging tests, such as a CT scan or MRI.  A procedure to see inside your esophagus and stomach (endoscopy).  A procedure to see inside your colon and rectum (colonoscopy). How is this treated? Treatment for this condition depends on the cause. If you continue to lose a lot of blood, you may need to be treated at a hospital. Treatment may include:  Taking supplements of iron, vitamin X09, or folic acid.  Taking a hormone medicine (erythropoietin) that can help to stimulate red blood cell growth.  Having a blood transfusion. This may be needed if you lose a lot of  blood.  Making changes to your diet.  Having surgery to remove your spleen. Follow these instructions at home:  Take over-the-counter and prescription medicines only as told by your health care provider.  Take supplements only as told by your health care  provider.  Follow any diet instructions that you were given by your health care provider.  Keep all follow-up visits as told by your health care provider. This is important. Contact a health care provider if:  You develop new bleeding anywhere in the body. Get help right away if:  You are very weak.  You are short of breath.  You have pain in your abdomen or chest.  You are dizzy or feel faint.  You have trouble concentrating.  You have bloody stools, black stools, or tarry stools.  You vomit repeatedly or you vomit up blood. These symptoms may represent a serious problem that is an emergency. Do not wait to see if the symptoms will go away. Get medical help right away. Call your local emergency services (911 in the [place] Do not drive yourself to the hospital. Summary  Anemia is a condition in which you do not have enough red blood cells or enough of a substance in your red blood cells that carries oxygen (hemoglobin).  Symptoms may occur suddenly or develop slowly.  If your anemia is mild, you may not have symptoms.  This condition is diagnosed with blood tests, a medical history, and a physical exam. Other tests may be needed.  Treatment for this condition depends on the cause of the anemia. This information is not intended to replace advice given to you by your health care provider. Make sure you discuss any questions you have with your health care provider. Document Revised: 05/07/2019 Document Reviewed: 05/07/2019 Elsevier Patient Education  2021 [employer].

## 2020-07-08 NOTE — Progress Notes (Signed)
Per Dr. Lindi Adie - okay to proceed with chemotherapy treatment with abnormal lab results and vital signs today. Patient to be scheduled for blood transfusion.

## 2020-07-08 NOTE — Progress Notes (Signed)
Patient with Hgb of 7.4 - MD recommendations for 1 unit of PRBC's.  Patient is scheduled for 1 unit on 1/27 @ 1pm.  Orders placed and verified.  Patient aware.

## 2020-07-09 ENCOUNTER — Other Ambulatory Visit: Payer: Self-pay

## 2020-07-09 ENCOUNTER — Inpatient Hospital Stay: Payer: Medicaid Other

## 2020-07-09 DIAGNOSIS — D649 Anemia, unspecified: Secondary | ICD-10-CM

## 2020-07-09 DIAGNOSIS — Z5112 Encounter for antineoplastic immunotherapy: Secondary | ICD-10-CM | POA: Diagnosis not present

## 2020-07-09 MED ORDER — DIPHENHYDRAMINE HCL 25 MG PO CAPS
ORAL_CAPSULE | ORAL | Status: AC
  Filled 2020-07-09: qty 1

## 2020-07-09 MED ORDER — ACETAMINOPHEN 325 MG PO TABS
650.0000 mg | ORAL_TABLET | Freq: Once | ORAL | Status: AC
  Administered 2020-07-09: 650 mg via ORAL

## 2020-07-09 MED ORDER — SODIUM CHLORIDE 0.9% FLUSH
10.0000 mL | INTRAVENOUS | Status: AC | PRN
  Administered 2020-07-09: 10 mL
  Filled 2020-07-09: qty 10

## 2020-07-09 MED ORDER — DIPHENHYDRAMINE HCL 25 MG PO CAPS
25.0000 mg | ORAL_CAPSULE | Freq: Once | ORAL | Status: AC
  Administered 2020-07-09: 25 mg via ORAL

## 2020-07-09 MED ORDER — ACETAMINOPHEN 325 MG PO TABS
ORAL_TABLET | ORAL | Status: AC
  Filled 2020-07-09: qty 2

## 2020-07-09 MED ORDER — SODIUM CHLORIDE 0.9% IV SOLUTION
250.0000 mL | Freq: Once | INTRAVENOUS | Status: DC
  Filled 2020-07-09: qty 250

## 2020-07-09 MED ORDER — HEPARIN SOD (PORK) LOCK FLUSH 100 UNIT/ML IV SOLN
500.0000 [IU] | Freq: Every day | INTRAVENOUS | Status: AC | PRN
  Administered 2020-07-09: 500 [IU]
  Filled 2020-07-09: qty 5

## 2020-07-09 NOTE — Patient Instructions (Signed)
Blood Transfusion, Adult °A blood transfusion is a procedure in which you receive blood through an IV tube. You may need this procedure because of: °A bleeding disorder. °An illness. °An injury. °A surgery. °The blood may come from someone else (a donor). You may also be able to donate blood for yourself. The blood given in a transfusion is made up of different types of cells. You may get: °Red blood cells. These carry oxygen to the cells in the body. °White blood cells. These help you fight infections. °Platelets. These help your blood to clot. °Plasma. This is the liquid part of your blood. It carries proteins and other substances through the body. °If you have a clotting disorder, you may also get other types of blood products. °Tell your doctor about: °Any blood disorders you have. °Any reactions you have had during a blood transfusion in the past. °Any allergies you have. °All medicines you are taking, including vitamins, herbs, eye drops, creams, and over-the-counter medicines. °Any surgeries you have had. °Any medical conditions you have. This includes any recent fever or cold symptoms. °Whether you are pregnant or may be pregnant. °What are the risks? °Generally, this is a safe procedure. However, problems may occur. °The most common problems include: °A mild allergic reaction. This includes red, swollen areas of skin (hives) and itching. °Fever or chills. This may be the body's response to new blood cells received. This may happen during or up to 4 hours after the transfusion. °More serious problems may include: °Too much fluid in the lungs. This may cause breathing problems. °A serious allergic reaction. This includes breathing trouble or swelling around the face and lips. °Lung injury. This causes breathing trouble and low oxygen in the blood. This can happen within hours of the transfusion or days later. °Too much iron. This can happen after getting many blood transfusions over a period of time. °An  infection or virus passed through the blood. This is rare. Donated blood is carefully tested before it is given. °Your body's defense system (immune system) trying to attack the new blood cells. This is rare. Symptoms may include fever, chills, nausea, low blood pressure, and low back or chest pain. °Donated cells attacking healthy tissues. This is rare. °What happens before the procedure? °Medicines °Ask your doctor about: °Changing or stopping your normal medicines. This is important. °Taking aspirin and ibuprofen. Do not take these medicines unless your doctor tells you to take them. °Taking over-the-counter medicines, vitamins, herbs, and supplements. °General instructions °Follow instructions from your doctor about what you cannot eat or drink. °You will have a blood test to find out your blood type. The test also finds out what type of blood your body will accept and matches it to the donor type. °If you are going to have a planned surgery, you may be able to donate your own blood. This may be done in case you need a transfusion. °You will have your temperature, blood pressure, and pulse checked. °You may receive medicine to help prevent an allergic reaction. This may be done if you have had a reaction to a transfusion before. This medicine may be given to you by mouth or through an IV tube. °This procedure lasts about 1-4 hours. Plan for the time you need. °What happens during the procedure? °An IV tube will be put into one of your veins. °The bag of donated blood will be attached to your IV tube. Then, the blood will enter through your vein. °Your temperature, blood   pressure, and pulse will be checked often. This is done to find early signs of a transfusion reaction. °Tell your nurse right away if you have any of these symptoms: °Shortness of breath or trouble breathing. °Chest or back pain. °Fever or chills. °Red, swollen areas of skin or itching. °If you have any signs or symptoms of a reaction, your  transfusion will be stopped. You may also be given medicine. °When the transfusion is finished, your IV tube will be taken out. °Pressure may be put on the IV site for a few minutes. °A bandage (dressing) will be put on the IV site. °The procedure may vary among doctors and hospitals.   °What happens after the procedure? °You will be monitored until you leave the hospital or clinic. This includes checking your temperature, blood pressure, pulse, breathing rate, and blood oxygen level. °Your blood may be tested to see how you are responding to the transfusion. °You may be warmed with fluids or blankets. This is done to keep the temperature of your body normal. °If you have your procedure in an outpatient setting, you will be told whom to contact to report any reactions. °Where to find more information °To learn more, visit the [employer]: redcross.org °Summary °A blood transfusion is a procedure in which you are given blood through an IV tube. °The blood may come from someone else (a donor). You may also be able to donate blood for yourself. °The blood you are given is made up of different blood cells. You may receive red blood cells, platelets, plasma, or white blood cells. °Your temperature, blood pressure, and pulse will be checked often. °After the procedure, your blood may be tested to see how you are responding. °This information is not intended to replace advice given to you by your health care provider. Make sure you discuss any questions you have with your health care provider. °Document Revised: 11/22/2018 Document Reviewed: 11/22/2018 °Elsevier Patient Education © 2021 [employer]vier Inc. ° °

## 2020-07-10 LAB — TYPE AND SCREEN
ABO/RH(D): B POS
Antibody Screen: NEGATIVE
Unit division: 0

## 2020-07-10 LAB — BPAM RBC
Blood Product Expiration Date: 202202172359
ISSUE DATE / TIME: 202201271340
Unit Type and Rh: 7300

## 2020-07-15 ENCOUNTER — Inpatient Hospital Stay (HOSPITAL_BASED_OUTPATIENT_CLINIC_OR_DEPARTMENT_OTHER): Payer: Medicaid Other | Admitting: Medical

## 2020-07-15 ENCOUNTER — Other Ambulatory Visit: Payer: Self-pay | Admitting: Medical

## 2020-07-15 ENCOUNTER — Ambulatory Visit: Payer: Self-pay

## 2020-07-15 ENCOUNTER — Ambulatory Visit: Payer: Self-pay | Admitting: Hematology and Oncology

## 2020-07-15 ENCOUNTER — Other Ambulatory Visit: Payer: Self-pay

## 2020-07-15 ENCOUNTER — Telehealth: Payer: Self-pay

## 2020-07-15 ENCOUNTER — Inpatient Hospital Stay: Payer: Medicaid Other

## 2020-07-15 ENCOUNTER — Inpatient Hospital Stay: Payer: Medicaid Other | Attending: Medical

## 2020-07-15 VITALS — BP 124/69 | HR 107 | Temp 98.8°F | Resp 18 | Ht 67.0 in | Wt 165.9 lb

## 2020-07-15 DIAGNOSIS — Z801 Family history of malignant neoplasm of trachea, bronchus and lung: Secondary | ICD-10-CM | POA: Insufficient documentation

## 2020-07-15 DIAGNOSIS — D649 Anemia, unspecified: Secondary | ICD-10-CM | POA: Diagnosis not present

## 2020-07-15 DIAGNOSIS — Z95828 Presence of other vascular implants and grafts: Secondary | ICD-10-CM

## 2020-07-15 DIAGNOSIS — Z9221 Personal history of antineoplastic chemotherapy: Secondary | ICD-10-CM | POA: Insufficient documentation

## 2020-07-15 DIAGNOSIS — D6481 Anemia due to antineoplastic chemotherapy: Secondary | ICD-10-CM | POA: Insufficient documentation

## 2020-07-15 DIAGNOSIS — Z170 Estrogen receptor positive status [ER+]: Secondary | ICD-10-CM

## 2020-07-15 DIAGNOSIS — Z5112 Encounter for antineoplastic immunotherapy: Secondary | ICD-10-CM | POA: Insufficient documentation

## 2020-07-15 DIAGNOSIS — F17210 Nicotine dependence, cigarettes, uncomplicated: Secondary | ICD-10-CM | POA: Diagnosis not present

## 2020-07-15 DIAGNOSIS — Z809 Family history of malignant neoplasm, unspecified: Secondary | ICD-10-CM | POA: Diagnosis not present

## 2020-07-15 DIAGNOSIS — C50211 Malignant neoplasm of upper-inner quadrant of right female breast: Secondary | ICD-10-CM | POA: Diagnosis not present

## 2020-07-15 DIAGNOSIS — Z5111 Encounter for antineoplastic chemotherapy: Secondary | ICD-10-CM | POA: Diagnosis present

## 2020-07-15 DIAGNOSIS — D6959 Other secondary thrombocytopenia: Secondary | ICD-10-CM | POA: Insufficient documentation

## 2020-07-15 DIAGNOSIS — T451X5A Adverse effect of antineoplastic and immunosuppressive drugs, initial encounter: Secondary | ICD-10-CM | POA: Insufficient documentation

## 2020-07-15 DIAGNOSIS — D696 Thrombocytopenia, unspecified: Secondary | ICD-10-CM | POA: Diagnosis not present

## 2020-07-15 LAB — CMP (CANCER CENTER ONLY)
ALT: 78 U/L — ABNORMAL HIGH (ref 0–44)
AST: 44 U/L — ABNORMAL HIGH (ref 15–41)
Albumin: 3.2 g/dL — ABNORMAL LOW (ref 3.5–5.0)
Alkaline Phosphatase: 120 U/L (ref 38–126)
Anion gap: 9 (ref 5–15)
BUN: 9 mg/dL (ref 6–20)
CO2: 26 mmol/L (ref 22–32)
Calcium: 9.4 mg/dL (ref 8.9–10.3)
Chloride: 101 mmol/L (ref 98–111)
Creatinine: 0.7 mg/dL (ref 0.44–1.00)
GFR, Estimated: >60 mL/min (ref >60)
Glucose, Bld: 97 mg/dL (ref 70–99)
Potassium: 3.4 mmol/L — ABNORMAL LOW (ref 3.5–5.1)
Sodium: 136 mmol/L (ref 135–145)
Total Bilirubin: 0.4 mg/dL (ref 0.3–1.2)
Total Protein: 7.5 g/dL (ref 6.5–8.1)

## 2020-07-15 LAB — CBC WITH DIFFERENTIAL (CANCER CENTER ONLY)
Abs Immature Granulocytes: 0.01 10*3/uL (ref 0.00–0.07)
Basophils Absolute: 0 10*3/uL (ref 0.0–0.1)
Basophils Relative: 0 %
Eosinophils Absolute: 0 10*3/uL (ref 0.0–0.5)
Eosinophils Relative: 1 %
HCT: 23.4 % — ABNORMAL LOW (ref 36.0–46.0)
Hemoglobin: 7.6 g/dL — ABNORMAL LOW (ref 12.0–15.0)
Immature Granulocytes: 0 %
Lymphocytes Relative: 16 %
Lymphs Abs: 0.4 10*3/uL — ABNORMAL LOW (ref 0.7–4.0)
MCH: 28.5 pg (ref 26.0–34.0)
MCHC: 32.5 g/dL (ref 30.0–36.0)
MCV: 87.6 fL (ref 80.0–100.0)
Monocytes Absolute: 0.4 10*3/uL (ref 0.1–1.0)
Monocytes Relative: 16 %
Neutro Abs: 1.7 10*3/uL (ref 1.7–7.7)
Neutrophils Relative %: 67 %
Platelet Count: 59 10*3/uL — ABNORMAL LOW (ref 150–400)
RBC: 2.67 MIL/uL — ABNORMAL LOW (ref 3.87–5.11)
RDW: 17.3 % — ABNORMAL HIGH (ref 11.5–15.5)
WBC Count: 2.6 10*3/uL — ABNORMAL LOW (ref 4.0–10.5)
nRBC: 0 % (ref 0.0–0.2)

## 2020-07-15 LAB — SAMPLE TO BLOOD BANK

## 2020-07-15 LAB — PREPARE RBC (CROSSMATCH)

## 2020-07-15 MED ORDER — ACETAMINOPHEN 325 MG PO TABS
ORAL_TABLET | ORAL | Status: AC
  Filled 2020-07-15: qty 2

## 2020-07-15 MED ORDER — DIPHENHYDRAMINE HCL 25 MG PO CAPS
ORAL_CAPSULE | ORAL | Status: AC
  Filled 2020-07-15: qty 1

## 2020-07-15 MED ORDER — ACETAMINOPHEN 325 MG PO TABS
650.0000 mg | ORAL_TABLET | Freq: Once | ORAL | Status: AC
  Administered 2020-07-15: 650 mg via ORAL

## 2020-07-15 MED ORDER — SODIUM CHLORIDE 0.9% FLUSH
10.0000 mL | Freq: Once | INTRAVENOUS | Status: AC
  Administered 2020-07-15: 10 mL
  Filled 2020-07-15: qty 10

## 2020-07-15 MED ORDER — HEPARIN SOD (PORK) LOCK FLUSH 100 UNIT/ML IV SOLN
500.0000 [IU] | Freq: Once | INTRAVENOUS | Status: AC
  Administered 2020-07-15: 500 [IU]
  Filled 2020-07-15: qty 5

## 2020-07-15 MED ORDER — SODIUM CHLORIDE 0.9% IV SOLUTION
250.0000 mL | Freq: Once | INTRAVENOUS | Status: AC
  Administered 2020-07-15: 250 mL via INTRAVENOUS
  Filled 2020-07-15: qty 250

## 2020-07-15 MED ORDER — DIPHENHYDRAMINE HCL 25 MG PO CAPS
25.0000 mg | ORAL_CAPSULE | Freq: Once | ORAL | Status: AC
  Administered 2020-07-15: 25 mg via ORAL

## 2020-07-15 NOTE — Progress Notes (Signed)
Patient documentation and charges were completed under office visit encounter for Sandi Mealy PA.

## 2020-07-15 NOTE — Patient Instructions (Signed)

## 2020-07-15 NOTE — Patient Instructions (Signed)

## 2020-07-15 NOTE — Patient Instructions (Signed)
Blood Transfusion, Adult °A blood transfusion is a procedure in which you receive blood through an IV tube. You may need this procedure because of: °A bleeding disorder. °An illness. °An injury. °A surgery. °The blood may come from someone else (a donor). You may also be able to donate blood for yourself. The blood given in a transfusion is made up of different types of cells. You may get: °Red blood cells. These carry oxygen to the cells in the body. °White blood cells. These help you fight infections. °Platelets. These help your blood to clot. °Plasma. This is the liquid part of your blood. It carries proteins and other substances through the body. °If you have a clotting disorder, you may also get other types of blood products. °Tell your doctor about: °Any blood disorders you have. °Any reactions you have had during a blood transfusion in the past. °Any allergies you have. °All medicines you are taking, including vitamins, herbs, eye drops, creams, and over-the-counter medicines. °Any surgeries you have had. °Any medical conditions you have. This includes any recent fever or cold symptoms. °Whether you are pregnant or may be pregnant. °What are the risks? °Generally, this is a safe procedure. However, problems may occur. °The most common problems include: °A mild allergic reaction. This includes red, swollen areas of skin (hives) and itching. °Fever or chills. This may be the body's response to new blood cells received. This may happen during or up to 4 hours after the transfusion. °More serious problems may include: °Too much fluid in the lungs. This may cause breathing problems. °A serious allergic reaction. This includes breathing trouble or swelling around the face and lips. °Lung injury. This causes breathing trouble and low oxygen in the blood. This can happen within hours of the transfusion or days later. °Too much iron. This can happen after getting many blood transfusions over a period of time. °An  infection or virus passed through the blood. This is rare. Donated blood is carefully tested before it is given. °Your body's defense system (immune system) trying to attack the new blood cells. This is rare. Symptoms may include fever, chills, nausea, low blood pressure, and low back or chest pain. °Donated cells attacking healthy tissues. This is rare. °What happens before the procedure? °Medicines °Ask your doctor about: °Changing or stopping your normal medicines. This is important. °Taking aspirin and ibuprofen. Do not take these medicines unless your doctor tells you to take them. °Taking over-the-counter medicines, vitamins, herbs, and supplements. °General instructions °Follow instructions from your doctor about what you cannot eat or drink. °You will have a blood test to find out your blood type. The test also finds out what type of blood your body will accept and matches it to the donor type. °If you are going to have a planned surgery, you may be able to donate your own blood. This may be done in case you need a transfusion. °You will have your temperature, blood pressure, and pulse checked. °You may receive medicine to help prevent an allergic reaction. This may be done if you have had a reaction to a transfusion before. This medicine may be given to you by mouth or through an IV tube. °This procedure lasts about 1-4 hours. Plan for the time you need. °What happens during the procedure? °An IV tube will be put into one of your veins. °The bag of donated blood will be attached to your IV tube. Then, the blood will enter through your vein. °Your temperature, blood   pressure, and pulse will be checked often. This is done to find early signs of a transfusion reaction. °Tell your nurse right away if you have any of these symptoms: °Shortness of breath or trouble breathing. °Chest or back pain. °Fever or chills. °Red, swollen areas of skin or itching. °If you have any signs or symptoms of a reaction, your  transfusion will be stopped. You may also be given medicine. °When the transfusion is finished, your IV tube will be taken out. °Pressure may be put on the IV site for a few minutes. °A bandage (dressing) will be put on the IV site. °The procedure may vary among doctors and hospitals.   °What happens after the procedure? °You will be monitored until you leave the hospital or clinic. This includes checking your temperature, blood pressure, pulse, breathing rate, and blood oxygen level. °Your blood may be tested to see how you are responding to the transfusion. °You may be warmed with fluids or blankets. This is done to keep the temperature of your body normal. °If you have your procedure in an outpatient setting, you will be told whom to contact to report any reactions. °Where to find more information °To learn more, visit the [employer]: redcross.org °Summary °A blood transfusion is a procedure in which you are given blood through an IV tube. °The blood may come from someone else (a donor). You may also be able to donate blood for yourself. °The blood you are given is made up of different blood cells. You may receive red blood cells, platelets, plasma, or white blood cells. °Your temperature, blood pressure, and pulse will be checked often. °After the procedure, your blood may be tested to see how you are responding. °This information is not intended to replace advice given to you by your health care provider. Make sure you discuss any questions you have with your health care provider. °Document Revised: 11/22/2018 Document Reviewed: 11/22/2018 °Elsevier Patient Education © 2021 [employer]vier Inc. ° °

## 2020-07-16 LAB — BPAM RBC
Blood Product Expiration Date: 202203012359
ISSUE DATE / TIME: 202202021140
Unit Type and Rh: 7300

## 2020-07-16 LAB — TYPE AND SCREEN
ABO/RH(D): B POS
Antibody Screen: NEGATIVE
Unit division: 0

## 2020-07-17 NOTE — Progress Notes (Signed)
[facility] Progress Note   Sharon Fischer [id number] 06/01/1966 55 y.o.  Sharon Fischer is managed by Dr. Nicholas Lose  Actively treated with chemotherapy/immunotherapy/hormonal therapy: yes  Current therapy: paclitaxel  Last treated: 07/08/2020 (cycle #5, day #8)  Next scheduled appointment with provider: 07/29/2020  Assessment: Plan:    Symptomatic anemia - Plan: Informed Consent Details: Physician/Practitioner Attestation; Transcribe to consent form and obtain patient signature, Care order/instruction, 0.9 %  sodium chloride infusion (Manually program via Guardrails IV Fluids), Prepare RBC (crossmatch), Transfuse RBC, acetaminophen (TYLENOL) tablet 650 mg, diphenhydrAMINE (BENADRYL) capsule 25 mg, Prepare RBC (crossmatch), Sample to Blood Bank, Type and screen, Type and screen, Transfuse RBC, Transfuse RBC, CANCELED: Type and screen  Port-A-Cath in place - Plan: heparin lock flush 100 unit/mL, sodium chloride flush (NS) 0.9 % injection 10 mL  Malignant neoplasm of upper-inner quadrant of right breast in female, estrogen receptor positive ([place]) - Plan: heparin lock flush 100 unit/mL, sodium chloride flush (NS) 0.9 % injection 10 mL  Thrombocytopenia (H[facility])   Symptomatic anemia: A CBC returned today with a hemoglobin of 7.6 and hematocrit of 23.4.  Patient was given 1 unit of packed red blood cells today.  Thrombocytopenia: A CBC returned today with a platelet count of 59.  She will return to clinic in 1 week for follow-up with a repeat CBC completed at that time.  ER positive malignant neoplasm of the right breast: The patient presents to the clinic today for consideration of cycle 5, day 15 of paclitaxel.  Given that her platelet count is 59 her treatment will be held today.  She will return in 1 week for reconsideration of chemotherapy.  Please see After Visit Summary for patient specific instructions.  Future Appointments  Date Time Provider [place]   07/22/2020  1:00 PM [facility] LAB [facility] None  07/22/2020  1:15 PM [facility] [place] FLUSH [facility] None  07/22/2020  2:00 PM [facility] INFUSION [facility] None  07/29/2020  8:45 AM [facility] LAB [facility] None  07/29/2020  9:00 AM [facility] [place] FLUSH [facility] None  07/29/2020  9:30 AM Jolinda Pinkstaff, Lucianne Lei E., PA-C [facility] None  07/29/2020 10:15 AM [facility] INFUSION [facility] None    Orders Placed This Encounter  Procedures  . Informed Consent Details: Physician/Practitioner Attestation; Transcribe to consent form and obtain patient signature  . Care order/instruction  . Prepare RBC (crossmatch)  . Sample to Blood Bank  . Type and screen       Subjective:   Patient ID:  Sharon Fischer is a 55 y.o. (DOB 1966/04/16) female.  Chief Complaint: No chief complaint on file.   HPI Sharon Fischer  is a 55 y.o. female with a diagnosis of is a 55 y.o. female with a diagnosis of an ER positive malignant neoplasm of the right breast.  She is followed by Dr. Nicholas Lose and presents to the clinic today for consideration of cycle 5, day 15 of paclitaxel.  Her biggest concern today is that she has a decreased appetite and inquires today about a appetite stimulant.  She is otherwise doing well without issues of concern.  She denies bleeding, bruising, fevers, chills, sweats, nausea, vomiting, constipation, or diarrhea.    Medications: I have reviewed the patient's current medications.  Allergies: No Known Allergies  Past Medical History:  Diagnosis Date  . Breast cancer ([place]) 03/2020  . Family history of throat cancer     Past Surgical History:  Procedure Laterality Date  . IR IMAGING GUIDED PORT INSERTION  03/30/2020  Family History  Problem Relation Age of Onset  . Throat cancer Maternal Uncle        dx >50, smoker  . Throat cancer Maternal Uncle        dx >50, smoker  . Cancer Cousin        unknown type, dx >50 (paternal first cousin)  . Cancer Cousin        unknown  type (paternal first cousin)    Social History   Socioeconomic History  . Marital status: Single    Spouse name: Not on file  . Number of children: Not on file  . Years of education: Not on file  . Highest education level: Not on file  Occupational History  . Not on file  Tobacco Use  . Smoking status: Current Every Day Smoker    Types: Cigarettes  . Smokeless tobacco: Never Used  . Tobacco comment: 7-8 cigs /day  Substance and Sexual Activity  . Alcohol use: Never  . Drug use: Never  . Sexual activity: Not on file  Other Topics Concern  . Not on file  Social History Narrative  . Not on file   Social Determinants of Health   Financial Resource Strain: Not on file  Food Insecurity: No Food Insecurity  . Worried About Charity fundraiser in the Last Year: Never true  . Ran Out of Food in the Last Year: Never true  Transportation Needs: No Transportation Needs  . Lack of Transportation (Medical): No  . Lack of Transportation (Non-Medical): No  Physical Activity: Not on file  Stress: Not on file  Social Connections: Not on file  Intimate Partner Violence: Not on file    Past Medical History, Surgical history, Social history, and Family history were reviewed and updated as appropriate.   Please see review of systems for further details on the patient's review from today.   Review of Systems:  Review of Systems  Constitutional: Positive for appetite change. Negative for chills, diaphoresis and fever.  HENT: Negative for trouble swallowing and voice change.   Respiratory: Negative for cough, chest tightness, shortness of breath and wheezing.   Cardiovascular: Negative for chest pain and palpitations.  Gastrointestinal: Negative for abdominal pain, constipation, diarrhea, nausea and vomiting.  Musculoskeletal: Negative for back pain and myalgias.  Neurological: Negative for dizziness, light-headedness and headaches.    Objective:   Physical Exam:  BP 124/69    Pulse (!) 107   Temp 98.8 F (37.1 C) (Oral)   Resp 18   Ht '5\' 7"'  (1.702 m)   Wt 165 lb 14.4 oz (75.3 kg)   SpO2 99%   BMI 25.98 kg/m  ECOG: 0  Physical Exam Constitutional:      General: She is not in acute distress.    Appearance: She is not diaphoretic.  HENT:     Head: Normocephalic and atraumatic.  Eyes:     General: No scleral icterus.       Right eye: No discharge.        Left eye: No discharge.     Conjunctiva/sclera: Conjunctivae normal.  Cardiovascular:     Rate and Rhythm: Normal rate and regular rhythm.     Heart sounds: Normal heart sounds. No murmur heard. No friction rub. No gallop.   Pulmonary:     Effort: Pulmonary effort is normal. No respiratory distress.     Breath sounds: Normal breath sounds. No wheezing or rales.  Skin:    General: Skin is warm and  dry.     Findings: No erythema or rash.  Neurological:     Mental Status: She is alert.     Coordination: Coordination normal.     Gait: Gait normal.  Psychiatric:        Mood and Affect: Mood normal.        Behavior: Behavior normal.        Thought Content: Thought content normal.        Judgment: Judgment normal.     Lab Review:     Component Value Date/Time   NA 136 07/15/2020 0848   K 3.4 (L) 07/15/2020 0848   CL 101 07/15/2020 0848   CO2 26 07/15/2020 0848   GLUCOSE 97 07/15/2020 0848   BUN 9 07/15/2020 0848   CREATININE 0.70 07/15/2020 0848   CALCIUM 9.4 07/15/2020 0848   PROT 7.5 07/15/2020 0848   ALBUMIN 3.2 (L) 07/15/2020 0848   AST 44 (H) 07/15/2020 0848   ALT 78 (H) 07/15/2020 0848   ALKPHOS 120 07/15/2020 0848   BILITOT 0.4 07/15/2020 0848   GFRNONAA >60 07/15/2020 0848   GFRAA  02/05/2007 1044    >60        The eGFR has been calculated using the MDRD equation. This calculation has not been validated in all clinical       Component Value Date/Time   WBC 2.6 (L) 07/15/2020 0848   WBC 5.4 02/05/2007 1044   RBC 2.67 (L) 07/15/2020 0848   HGB 7.6 (L) 07/15/2020 0848    HCT 23.4 (L) 07/15/2020 0848   PLT 59 (L) 07/15/2020 0848   MCV 87.6 07/15/2020 0848   MCH 28.5 07/15/2020 0848   MCHC 32.5 07/15/2020 0848   RDW 17.3 (H) 07/15/2020 0848   LYMPHSABS 0.4 (L) 07/15/2020 0848   MONOABS 0.4 07/15/2020 0848   EOSABS 0.0 07/15/2020 0848   BASOSABS 0.0 07/15/2020 0848   -------------------------------  Imaging from last 24 hours (if applicable):  Radiology interpretation: No results found.

## 2020-07-20 NOTE — Progress Notes (Signed)
..  The following Assist/Replace Program for [facility] from [place] has been terminated due to Full Medicaid starting 06/13/2020.  Last DOS:07/01/2020

## 2020-07-20 NOTE — Progress Notes (Signed)
..  The following Assist/Replace Program for [place] from [place] has been terminated due to Full Medicaid starting 06/13/2020.  Last DOS:06/12/2020

## 2020-07-21 MED FILL — Fosaprepitant Dimeglumine For IV Infusion 150 MG (Base Eq): INTRAVENOUS | Qty: 5 | Status: AC

## 2020-07-22 ENCOUNTER — Other Ambulatory Visit: Payer: Self-pay

## 2020-07-22 ENCOUNTER — Inpatient Hospital Stay: Payer: Medicaid Other

## 2020-07-22 VITALS — BP 119/73 | HR 109 | Temp 98.6°F | Resp 18 | Wt 164.0 lb

## 2020-07-22 DIAGNOSIS — Z170 Estrogen receptor positive status [ER+]: Secondary | ICD-10-CM

## 2020-07-22 DIAGNOSIS — Z95828 Presence of other vascular implants and grafts: Secondary | ICD-10-CM

## 2020-07-22 DIAGNOSIS — Z5112 Encounter for antineoplastic immunotherapy: Secondary | ICD-10-CM | POA: Diagnosis not present

## 2020-07-22 LAB — CMP (CANCER CENTER ONLY)
ALT: 32 U/L (ref 0–44)
AST: 23 U/L (ref 15–41)
Albumin: 3.1 g/dL — ABNORMAL LOW (ref 3.5–5.0)
Alkaline Phosphatase: 113 U/L (ref 38–126)
Anion gap: 6 (ref 5–15)
BUN: 6 mg/dL (ref 6–20)
CO2: 25 mmol/L (ref 22–32)
Calcium: 8.7 mg/dL — ABNORMAL LOW (ref 8.9–10.3)
Chloride: 100 mmol/L (ref 98–111)
Creatinine: 0.62 mg/dL (ref 0.44–1.00)
GFR, Estimated: >60 mL/min (ref >60)
Glucose, Bld: 105 mg/dL — ABNORMAL HIGH (ref 70–99)
Potassium: 3.4 mmol/L — ABNORMAL LOW (ref 3.5–5.1)
Sodium: 131 mmol/L — ABNORMAL LOW (ref 135–145)
Total Bilirubin: 0.7 mg/dL (ref 0.3–1.2)
Total Protein: 7.2 g/dL (ref 6.5–8.1)

## 2020-07-22 LAB — CBC WITH DIFFERENTIAL (CANCER CENTER ONLY)
Abs Immature Granulocytes: 0.01 10*3/uL (ref 0.00–0.07)
Basophils Absolute: 0 10*3/uL (ref 0.0–0.1)
Basophils Relative: 0 %
Eosinophils Absolute: 0.1 10*3/uL (ref 0.0–0.5)
Eosinophils Relative: 2 %
HCT: 24.3 % — ABNORMAL LOW (ref 36.0–46.0)
Hemoglobin: 8.1 g/dL — ABNORMAL LOW (ref 12.0–15.0)
Immature Granulocytes: 0 %
Lymphocytes Relative: 20 %
Lymphs Abs: 0.5 10*3/uL — ABNORMAL LOW (ref 0.7–4.0)
MCH: 29.1 pg (ref 26.0–34.0)
MCHC: 33.3 g/dL (ref 30.0–36.0)
MCV: 87.4 fL (ref 80.0–100.0)
Monocytes Absolute: 0.2 10*3/uL (ref 0.1–1.0)
Monocytes Relative: 7 %
Neutro Abs: 1.8 10*3/uL (ref 1.7–7.7)
Neutrophils Relative %: 71 %
Platelet Count: 22 10*3/uL — ABNORMAL LOW (ref 150–400)
RBC: 2.78 MIL/uL — ABNORMAL LOW (ref 3.87–5.11)
RDW: 16.6 % — ABNORMAL HIGH (ref 11.5–15.5)
WBC Count: 2.6 10*3/uL — ABNORMAL LOW (ref 4.0–10.5)
nRBC: 0 % (ref 0.0–0.2)

## 2020-07-22 MED ORDER — HEPARIN SOD (PORK) LOCK FLUSH 100 UNIT/ML IV SOLN
500.0000 [IU] | Freq: Once | INTRAVENOUS | Status: AC
  Administered 2020-07-22: 500 [IU] via INTRAVENOUS
  Filled 2020-07-22: qty 5

## 2020-07-22 MED ORDER — SODIUM CHLORIDE 0.9% FLUSH
10.0000 mL | Freq: Once | INTRAVENOUS | Status: AC
  Administered 2020-07-22: 10 mL
  Filled 2020-07-22: qty 10

## 2020-07-22 MED ORDER — SODIUM CHLORIDE 0.9% FLUSH
10.0000 mL | Freq: Once | INTRAVENOUS | Status: AC
  Administered 2020-07-22: 10 mL via INTRAVENOUS
  Filled 2020-07-22: qty 10

## 2020-07-22 NOTE — Patient Instructions (Signed)
[facility] Discharge Instructions for Patients Receiving Chemotherapy  To help prevent nausea and vomiting after your treatment, we encourage you to take your nausea medication as directed.    If you develop nausea and vomiting that is not controlled by your nausea medication, call the clinic.   BELOW ARE SYMPTOMS THAT SHOULD BE REPORTED IMMEDIATELY:  *FEVER GREATER THAN 100.5 F  *CHILLS WITH OR WITHOUT FEVER  NAUSEA AND VOMITING THAT IS NOT CONTROLLED WITH YOUR NAUSEA MEDICATION  *UNUSUAL SHORTNESS OF BREATH  *UNUSUAL BRUISING OR BLEEDING  TENDERNESS IN MOUTH AND THROAT WITH OR WITHOUT PRESENCE OF ULCERS  *URINARY PROBLEMS  *BOWEL PROBLEMS  UNUSUAL RASH Items with * indicate a potential emergency and should be followed up as soon as possible.  Feel free to call the clinic should you have any questions or concerns. The clinic phone number is [phone].  Please show the [place] at check-in to the Emergency Department and triage nurse.   Managing Low Blood Counts During Cancer Treatment Cancer treatments such as chemotherapy and radiation can sometimes cause a drop in the supply of blood cells in the body, including red blood cells, white blood cells, and platelets. These blood cells are produced in the body and are released into the blood to perform specific functions:  Red blood cells carry gases such as oxygen and carbon dioxide to and from your lungs.  White blood cells help protect you from infection.  Platelets help your body form blood clots to prevent and control bleeding. When cancer treatments cause a drop in blood cell counts, your body may not have enough cells to keep up its normal functions. Symptoms or problems that may result will vary depending on which type of blood cells the treatment is affecting. If your blood counts are low, you can take steps to help manage any problems. How can low blood counts affect me? Low blood counts  have various effects depending on the type of blood cells involved:  If you have a low number of red blood cells, you have a condition called anemia. This can cause symptoms such as: ? Feeling tired and weak. ? Feeling light-headed. ? Being short of breath.  If you have a low number of white blood cells, you may be at higher risk for infections.  If you have a low number of platelets, you may bleed more easily, or your body may have trouble stopping any bleeding. You may also have more bruising. How to manage symptoms or prevent problems from a low blood count If you have a low blood count, you can take steps to manage symptoms or prevent problems that may develop. The steps to take will depend on which type of blood cell is low. Low red blood cells Take these steps to help manage the symptoms of anemia:  Go for a walk or do some light exercise each day.  Take short naps during the day.  Eat foods that contain a lot of iron and protein. These include leafy green vegetables, meat and fish, beans, sweet potatoes, and dried fruit such as prunes, raisins, and apricots.  Ask for help with errands and with work that needs to be done around the house. You need to save your energy.  Take vitamins or supplements-such as iron, vitamin E99, or folic acid-as told by your health care provider.  Practice relaxation techniques, such as yoga or meditation.   Low white blood cells Take these steps to help prevent infections:  Keep your body clean. Make sure you: ? Wash your hands often with warm, soapy water. ? If you get a scrape or cut, clean it thoroughly right away. ? Take care when cleaning yourself after using the bathroom. Tell your health care provider if you have any rectal sores or bleeding. ? Avoid contact with pet waste. Wash your hands after handling pets. ? Do not swim or wade in lakes, ponds, rivers, water parks, or hot tubs.  Avoid crowds of people and any person who has the flu or  a fever. To protect yourself, you may be instructed to wear a mask whenever you are around other people.  Avoid dental work. Check your mouth each day for sores or signs of infection.  Do not share utensils.  Avoid fresh flowers and plants or dried flowers.  Follow food safety guidelines. Cook meat thoroughly and wash all raw fruits and vegetables.   Low platelets Take these steps to help prevent or control bleeding and bruising:  Use an electric razor for shaving instead of a blade.  Use a soft toothbrush and be careful when cleaning your mouth and teeth. Talk with your cancer care team about whether you should avoid flossing. If your mouth is bleeding, rinse it with ice water.  Avoid activities that could cause injury, such as contact sports.  Talk with your health care provider about using stool softeners to avoid constipation and straining during bowel movements.  Do not use medicines such as ibuprofen, aspirin, or naproxen unless your health care provider tells you to.  Limit alcohol use. If you drink alcohol: ? Limit how much you use to:  0-1 drink a day for women.  0-2 drinks a day for men. ? Be aware of how much alcohol is in your drink. In the [place] one drink equals one 12 oz bottle of beer (355 mL), one 5 oz glass of wine (148 mL), or one 1 oz glass of hard liquor (44 mL).  Monitor any bleeding closely. If you start bleeding, hold pressure on the area for 5 minutes to stop the bleeding. Bleeding that does not stop is considered an emergency. What treatments can help increase a low blood count? If needed, your health care provider may recommend treatment for a low blood count. Treatment will depend on the type of blood cell that is low and the severity of your condition. Treatment options may include:  Taking medicines to help stimulate the growth of blood cells. This is an option for treating a low red blood cell count. Your health care provider may also recommend that  you take iron, folic acid, or vitamin B12 supplements.  Making dietary changes. Including more iron and protein in your diet can help stimulate the growth of red blood cells.  Adjusting your current medicines to help raise blood counts.  Making changes to your treatment plan.  Having a blood transfusion. This may be done if your blood count is very low. Where to find more information American Cancer Society: cancer.org Contact a health care provider if you:  Feel extremely tired and weak.  Have more bruising or bleeding.  Feel ill or develop a cough.  Have swelling or redness.  Have mouth sores or a sore throat.  Have painful urination or have blood in your urine or stool.  Have abdominal pain or diarrhea.  Are thinking of taking any new supplements or vitamins or making dietary changes. Get help right away if you:  Are short of breath,  have chest pain, or feel dizzy.  Have a fever or chills.  Have bleeding that will not stop. Summary  Cancer treatments such as chemotherapy and radiation can sometimes cause a drop in the supply of blood cells in the body, including red blood cells, white blood cells, and platelets.  If you have a low blood count, you can take steps to manage symptoms or prevent problems that may develop.  Depending on which type of blood cell is low, you may need to take steps to prevent infection, prevent bleeding, or manage symptoms that may develop.  Any treatment will depend on the type of blood cell that is low and the severity of your condition.  Contact a health care provider if you feel extremely tired and weak. This information is not intended to replace advice given to you by your health care provider. Make sure you discuss any questions you have with your health care provider. Document Revised: 02/20/2019 Document Reviewed: 02/15/2019 Elsevier Patient Education  [place].

## 2020-07-22 NOTE — Progress Notes (Signed)
Dr. Lindi Adie notified of platelet level of 22 and HR of 109. Per Dr. Lindi Adie, patient will not receive treatment today. Patient was educated to look out for signs of decreased platelets such as bleeding, bruising and petechiae. Patient verbalized understanding and left infusion room stable with no complaints.

## 2020-07-22 NOTE — Patient Instructions (Signed)

## 2020-07-29 ENCOUNTER — Inpatient Hospital Stay: Payer: Medicaid Other

## 2020-07-29 ENCOUNTER — Inpatient Hospital Stay (HOSPITAL_BASED_OUTPATIENT_CLINIC_OR_DEPARTMENT_OTHER): Payer: Medicaid Other | Admitting: Medical

## 2020-07-29 ENCOUNTER — Other Ambulatory Visit: Payer: Self-pay | Admitting: Hematology and Oncology

## 2020-07-29 ENCOUNTER — Other Ambulatory Visit: Payer: Self-pay

## 2020-07-29 VITALS — BP 128/66 | HR 100 | Temp 98.1°F | Resp 18 | Ht 67.0 in | Wt 160.7 lb

## 2020-07-29 DIAGNOSIS — Z170 Estrogen receptor positive status [ER+]: Secondary | ICD-10-CM

## 2020-07-29 DIAGNOSIS — D649 Anemia, unspecified: Secondary | ICD-10-CM

## 2020-07-29 DIAGNOSIS — Z5112 Encounter for antineoplastic immunotherapy: Secondary | ICD-10-CM | POA: Diagnosis not present

## 2020-07-29 DIAGNOSIS — C50211 Malignant neoplasm of upper-inner quadrant of right female breast: Secondary | ICD-10-CM | POA: Diagnosis not present

## 2020-07-29 DIAGNOSIS — Z95828 Presence of other vascular implants and grafts: Secondary | ICD-10-CM

## 2020-07-29 DIAGNOSIS — E876 Hypokalemia: Secondary | ICD-10-CM | POA: Diagnosis not present

## 2020-07-29 LAB — CBC WITH DIFFERENTIAL (CANCER CENTER ONLY)
Abs Immature Granulocytes: 0 10*3/uL (ref 0.00–0.07)
Basophils Absolute: 0 10*3/uL (ref 0.0–0.1)
Basophils Relative: 0 %
Eosinophils Absolute: 0.3 10*3/uL (ref 0.0–0.5)
Eosinophils Relative: 10 %
HCT: 22.5 % — ABNORMAL LOW (ref 36.0–46.0)
Hemoglobin: 7.5 g/dL — ABNORMAL LOW (ref 12.0–15.0)
Immature Granulocytes: 0 %
Lymphocytes Relative: 25 %
Lymphs Abs: 0.7 10*3/uL (ref 0.7–4.0)
MCH: 30.2 pg (ref 26.0–34.0)
MCHC: 33.3 g/dL (ref 30.0–36.0)
MCV: 90.7 fL (ref 80.0–100.0)
Monocytes Absolute: 0.4 10*3/uL (ref 0.1–1.0)
Monocytes Relative: 15 %
Neutro Abs: 1.4 10*3/uL — ABNORMAL LOW (ref 1.7–7.7)
Neutrophils Relative %: 50 %
Platelet Count: 97 10*3/uL — ABNORMAL LOW (ref 150–400)
RBC: 2.48 MIL/uL — ABNORMAL LOW (ref 3.87–5.11)
RDW: 17.4 % — ABNORMAL HIGH (ref 11.5–15.5)
WBC Count: 2.7 10*3/uL — ABNORMAL LOW (ref 4.0–10.5)
nRBC: 0 % (ref 0.0–0.2)

## 2020-07-29 LAB — CMP (CANCER CENTER ONLY)
ALT: 24 U/L (ref 0–44)
AST: 29 U/L (ref 15–41)
Albumin: 3.1 g/dL — ABNORMAL LOW (ref 3.5–5.0)
Alkaline Phosphatase: 111 U/L (ref 38–126)
Anion gap: 8 (ref 5–15)
BUN: 6 mg/dL (ref 6–20)
CO2: 23 mmol/L (ref 22–32)
Calcium: 8.9 mg/dL (ref 8.9–10.3)
Chloride: 102 mmol/L (ref 98–111)
Creatinine: 0.63 mg/dL (ref 0.44–1.00)
GFR, Estimated: >60 mL/min
Glucose, Bld: 101 mg/dL — ABNORMAL HIGH (ref 70–99)
Potassium: 3.2 mmol/L — ABNORMAL LOW (ref 3.5–5.1)
Sodium: 133 mmol/L — ABNORMAL LOW (ref 135–145)
Total Bilirubin: 0.8 mg/dL (ref 0.3–1.2)
Total Protein: 7.2 g/dL (ref 6.5–8.1)

## 2020-07-29 LAB — SAMPLE TO BLOOD BANK

## 2020-07-29 LAB — PREPARE RBC (CROSSMATCH)

## 2020-07-29 MED ORDER — SODIUM CHLORIDE 0.9% FLUSH
10.0000 mL | Freq: Once | INTRAVENOUS | Status: AC
  Administered 2020-07-29: 10 mL
  Filled 2020-07-29: qty 10

## 2020-07-29 MED ORDER — SODIUM CHLORIDE 0.9 % IV SOLN
50.0000 mg/m2 | Freq: Once | INTRAVENOUS | Status: AC
  Administered 2020-07-29: 102 mg via INTRAVENOUS
  Filled 2020-07-29: qty 17

## 2020-07-29 MED ORDER — FAMOTIDINE IN NACL 20-0.9 MG/50ML-% IV SOLN
INTRAVENOUS | Status: AC
  Filled 2020-07-29: qty 50

## 2020-07-29 MED ORDER — ONDANSETRON HCL 4 MG/2ML IJ SOLN
INTRAMUSCULAR | Status: AC
  Filled 2020-07-29: qty 4

## 2020-07-29 MED ORDER — SODIUM CHLORIDE 0.9% FLUSH
10.0000 mL | INTRAVENOUS | Status: DC | PRN
  Administered 2020-07-29: 10 mL
  Filled 2020-07-29: qty 10

## 2020-07-29 MED ORDER — SODIUM CHLORIDE 0.9 % IV SOLN
Freq: Once | INTRAVENOUS | Status: AC
Start: 2020-07-29 — End: 2020-07-29
  Filled 2020-07-29: qty 250

## 2020-07-29 MED ORDER — HEPARIN SOD (PORK) LOCK FLUSH 100 UNIT/ML IV SOLN
500.0000 [IU] | Freq: Once | INTRAVENOUS | Status: AC | PRN
  Administered 2020-07-29: 500 [IU]
  Filled 2020-07-29: qty 5

## 2020-07-29 MED ORDER — SODIUM CHLORIDE 0.9 % IV SOLN
200.0000 mg | Freq: Once | INTRAVENOUS | Status: AC
  Administered 2020-07-29: 200 mg via INTRAVENOUS
  Filled 2020-07-29: qty 8

## 2020-07-29 MED ORDER — POTASSIUM CHLORIDE CRYS ER 20 MEQ PO TBCR: 20.0000 meq | EXTENDED_RELEASE_TABLET | Freq: Every day | ORAL | 0 refills | Status: DC

## 2020-07-29 MED ORDER — FAMOTIDINE IN NACL 20-0.9 MG/50ML-% IV SOLN
20.0000 mg | Freq: Once | INTRAVENOUS | Status: AC
  Administered 2020-07-29: 20 mg via INTRAVENOUS

## 2020-07-29 MED ORDER — DIPHENHYDRAMINE HCL 50 MG/ML IJ SOLN
50.0000 mg | Freq: Once | INTRAMUSCULAR | Status: AC
  Administered 2020-07-29: 50 mg via INTRAVENOUS

## 2020-07-29 MED ORDER — DIPHENHYDRAMINE HCL 50 MG/ML IJ SOLN
INTRAMUSCULAR | Status: AC
  Filled 2020-07-29: qty 1

## 2020-07-29 MED ORDER — ONDANSETRON HCL 4 MG/2ML IJ SOLN
8.0000 mg | Freq: Once | INTRAMUSCULAR | Status: AC
  Administered 2020-07-29: 8 mg via INTRAVENOUS

## 2020-07-29 NOTE — Patient Instructions (Signed)

## 2020-07-29 NOTE — Patient Instructions (Signed)
[facility] Discharge Instructions for Patients Receiving Chemotherapy  Today you received the following chemotherapy agents: paclitaxel.  To help prevent nausea and vomiting after your treatment, we encourage you to take your nausea medication as directed.   If you develop nausea and vomiting that is not controlled by your nausea medication, call the clinic.   BELOW ARE SYMPTOMS THAT SHOULD BE REPORTED IMMEDIATELY:  *FEVER GREATER THAN 100.5 F  *CHILLS WITH OR WITHOUT FEVER  NAUSEA AND VOMITING THAT IS NOT CONTROLLED WITH YOUR NAUSEA MEDICATION  *UNUSUAL SHORTNESS OF BREATH  *UNUSUAL BRUISING OR BLEEDING  TENDERNESS IN MOUTH AND THROAT WITH OR WITHOUT PRESENCE OF ULCERS  *URINARY PROBLEMS  *BOWEL PROBLEMS  UNUSUAL RASH Items with * indicate a potential emergency and should be followed up as soon as possible.  Feel free to call the clinic should you have any questions or concerns. The clinic phone number is [phone].  Please show the CHEMO ALERT CARD at check-in to the Emergency Department and triage nurse.   

## 2020-07-29 NOTE — Progress Notes (Signed)
[facility] Progress Note   Takenya Fischer [id number] 05-01-1966 55 y.o.  Sharon Fischer is managed by Dr. Nicholas Lose   Actively treated with chemotherapy/immunotherapy/hormonal therapy: yes  Current therapy: Paclitaxel and Keytruda  Last treated: 07/08/2020 (cycle 5, day 8)  Next scheduled appointment with provider: 08/05/2020  Assessment: Plan:    Symptomatic anemia - Plan: Sample to Blood Bank, Informed Consent Details: Physician/Practitioner Attestation; Transcribe to consent form and obtain patient signature, Type and screen, Care order/instruction, Prepare RBC (crossmatch), Sample to Blood Bank, Type and screen, Prepare RBC (crossmatch)  Malignant neoplasm of upper-inner quadrant of right breast in female, estrogen receptor positive (HCC)  Hypokalemia   Symptomatic anemia: A CBC returned today with a hemoglobin of 7.5.  The patient will be transfused with 1 unit of packed red blood cells on 07/31/2020.  ER positive malignant neoplasm of the right breast: The patient continues to be followed by Dr. Nicholas Lose and will be treated with cycle #6, day 1 of paclitaxel and Keytruda today with a dose reduction of the patient's paclitaxel.  She will return on 08/05/2020 for consideration of her next treatment of chemotherapy, labs, and an appointment with Dr. Nicholas Lose.  Hypokalemia: A chemistry panel returned today with a potassium of 3.2.  She was given a prescription for potassium chloride 20 mEq p.o. once daily x14 days.  Please see After Visit Summary for patient specific instructions.  Future Appointments  Date Time Provider [place]  08/05/2020  8:30 AM [facility] INFUSION [facility] None    Orders Placed This Encounter  Procedures  . Informed Consent Details: Physician/Practitioner Attestation; Transcribe to consent form and obtain patient signature  . Care order/instruction  . Sample to Blood Bank  . Type and screen  . Prepare RBC  (crossmatch)       Subjective:   Patient ID:  Sharon Fischer is a 55 y.o. (DOB 12-29-1965) female.  Chief Complaint: No chief complaint on file.   HPI Sharon Fischer  is a 55 y.o. female with a diagnosis of  ER positive malignant neoplasm of the right breast.  She is managed by Dr. Nicholas Lose and presents today for consideration of cycle #6, day 1 of paclitaxel and Keytruda today.  She reports that her energy level is low.  She has been weak and tired with nausea and vomiting twice yesterday.  She denies constipation, diarrhea, fevers, chills, or sweats.  Her last treatment of chemotherapy which would have been cycle 5, day 15 was held since 07/15/2020 secondary to thrombocytopenia.  Her CBC returned today with a platelet count better at 97.  Her CBC did show a hemoglobin of 7.5 and a hematocrit of 22.5.  A chemistry panel showed a potassium of 3.2.   Medications: I have reviewed the patient's current medications.  Allergies: No Known Allergies  Past Medical History:  Diagnosis Date  . Breast cancer ([place]) 03/2020  . Family history of throat cancer     Past Surgical History:  Procedure Laterality Date  . IR IMAGING GUIDED PORT INSERTION  03/30/2020    Family History  Problem Relation Age of Onset  . Throat cancer Maternal Uncle        dx >50, smoker  . Throat cancer Maternal Uncle        dx >50, smoker  . Cancer Cousin        unknown type, dx >50 (paternal first cousin)  . Cancer Cousin        unknown type (paternal  first cousin)    Social History   Socioeconomic History  . Marital status: Single    Spouse name: Not on file  . Number of children: Not on file  . Years of education: Not on file  . Highest education level: Not on file  Occupational History  . Not on file  Tobacco Use  . Smoking status: Current Every Day Smoker    Types: Cigarettes  . Smokeless tobacco: Never Used  . Tobacco comment: 7-8 cigs /day  Substance and Sexual Activity  . Alcohol use:  Never  . Drug use: Never  . Sexual activity: Not on file  Other Topics Concern  . Not on file  Social History Narrative  . Not on file   Social Determinants of Health   Financial Resource Strain: Not on file  Food Insecurity: No Food Insecurity  . Worried About Charity fundraiser in the Last Year: Never true  . Ran Out of Food in the Last Year: Never true  Transportation Needs: No Transportation Needs  . Lack of Transportation (Medical): No  . Lack of Transportation (Non-Medical): No  Physical Activity: Not on file  Stress: Not on file  Social Connections: Not on file  Intimate Partner Violence: Not on file    Past Medical History, Surgical history, Social history, and Family history were reviewed and updated as appropriate.   Please see review of systems for further details on the patient's review from today.   Review of Systems:  Review of Systems  Constitutional: Positive for fatigue. Negative for chills, diaphoresis and fever.  HENT: Negative for trouble swallowing and voice change.   Respiratory: Negative for cough, chest tightness, shortness of breath and wheezing.   Cardiovascular: Negative for chest pain and palpitations.  Gastrointestinal: Positive for nausea and vomiting. Negative for abdominal pain, constipation and diarrhea.  Musculoskeletal: Negative for back pain and myalgias.  Neurological: Positive for weakness. Negative for dizziness, light-headedness and headaches.    Objective:   Physical Exam:  BP 128/66 (BP Location: Left Arm, Patient Position: Sitting)   Pulse 100   Temp 98.1 F (36.7 C) (Tympanic)   Resp 18   Ht '5\' 7"'  (1.702 m)   Wt 160 lb 11.2 oz (72.9 kg)   SpO2 99%   BMI 25.17 kg/m  ECOG: 1  Physical Exam Constitutional:      General: She is not in acute distress.    Appearance: She is not diaphoretic.  HENT:     Head: Normocephalic and atraumatic.  Eyes:     General: No scleral icterus.       Right eye: No discharge.         Left eye: No discharge.     Conjunctiva/sclera: Conjunctivae normal.  Cardiovascular:     Rate and Rhythm: Normal rate and regular rhythm.     Heart sounds: Normal heart sounds. No murmur heard. No friction rub. No gallop.   Pulmonary:     Effort: Pulmonary effort is normal. No respiratory distress.     Breath sounds: Normal breath sounds. No wheezing or rales.  Skin:    General: Skin is warm and dry.     Findings: No erythema or rash.  Neurological:     Mental Status: She is alert.     Coordination: Coordination normal.     Gait: Gait normal.  Psychiatric:        Mood and Affect: Mood normal.        Behavior: Behavior normal.  Thought Content: Thought content normal.        Judgment: Judgment normal.     Lab Review:     Component Value Date/Time   NA 133 (L) 07/29/2020 0920   K 3.2 (L) 07/29/2020 0920   CL 102 07/29/2020 0920   CO2 23 07/29/2020 0920   GLUCOSE 101 (H) 07/29/2020 0920   BUN 6 07/29/2020 0920   CREATININE 0.63 07/29/2020 0920   CALCIUM 8.9 07/29/2020 0920   PROT 7.2 07/29/2020 0920   ALBUMIN 3.1 (L) 07/29/2020 0920   AST 29 07/29/2020 0920   ALT 24 07/29/2020 0920   ALKPHOS 111 07/29/2020 0920   BILITOT 0.8 07/29/2020 0920   GFRNONAA >60 07/29/2020 0920   GFRAA  02/05/2007 1044    >60        The eGFR has been calculated using the MDRD equation. This calculation has not been validated in all clinical       Component Value Date/Time   WBC 2.7 (L) 07/29/2020 0920   WBC 5.4 02/05/2007 1044   RBC 2.48 (L) 07/29/2020 0920   HGB 7.5 (L) 07/29/2020 0920   HCT 22.5 (L) 07/29/2020 0920   PLT 97 (L) 07/29/2020 0920   MCV 90.7 07/29/2020 0920   MCH 30.2 07/29/2020 0920   MCHC 33.3 07/29/2020 0920   RDW 17.4 (H) 07/29/2020 0920   LYMPHSABS 0.7 07/29/2020 0920   MONOABS 0.4 07/29/2020 0920   EOSABS 0.3 07/29/2020 0920   BASOSABS 0.0 07/29/2020 0920   -------------------------------  Imaging from last 24 hours (if applicable):  Radiology  interpretation: No results found.    OK to treat with dose reduction per Dr. Lindi Adie.  Sandi Mealy, MHS, PA-C Physician Assistant  This case was discussed with Dr. Lindi Adie. He expresses agreement with my management of this patient.

## 2020-07-29 NOTE — Progress Notes (Signed)
Cycle 5, Day 15 orders canceled. Pt was not treated due to decreased hemoglobin and plct. Patient due for Cycle 6, day 1 today. No carboplatin today due to CBC. Premedications adjusted. Decreased Taxol to 50mg /m2 per Dr. Lindi Adie. Orders moved and changed as requested by MD.  Acquanetta Belling, RPH, BCPS, BCOP 07/29/2020. 11:01 AM

## 2020-07-30 ENCOUNTER — Telehealth: Payer: Self-pay | Admitting: Medical

## 2020-07-30 ENCOUNTER — Encounter: Payer: Self-pay | Admitting: *Deleted

## 2020-07-30 NOTE — Telephone Encounter (Signed)
Scheduled per 02/16 los, patient is notified of 02/18 appointment.

## 2020-07-31 ENCOUNTER — Telehealth: Payer: Self-pay | Admitting: Hematology and Oncology

## 2020-07-31 ENCOUNTER — Inpatient Hospital Stay (HOSPITAL_BASED_OUTPATIENT_CLINIC_OR_DEPARTMENT_OTHER): Payer: Medicaid Other | Admitting: Medical

## 2020-07-31 ENCOUNTER — Ambulatory Visit: Payer: Medicaid Other | Admitting: Hematology and Oncology

## 2020-07-31 ENCOUNTER — Other Ambulatory Visit: Payer: Medicaid Other

## 2020-07-31 ENCOUNTER — Other Ambulatory Visit: Payer: Self-pay | Admitting: *Deleted

## 2020-07-31 ENCOUNTER — Other Ambulatory Visit: Payer: Self-pay

## 2020-07-31 ENCOUNTER — Encounter: Payer: Self-pay | Admitting: *Deleted

## 2020-07-31 VITALS — BP 100/55 | HR 115 | Temp 98.5°F | Resp 18 | Ht 67.0 in | Wt 161.6 lb

## 2020-07-31 DIAGNOSIS — Z5112 Encounter for antineoplastic immunotherapy: Secondary | ICD-10-CM | POA: Diagnosis not present

## 2020-07-31 DIAGNOSIS — D649 Anemia, unspecified: Secondary | ICD-10-CM

## 2020-07-31 DIAGNOSIS — Z170 Estrogen receptor positive status [ER+]: Secondary | ICD-10-CM

## 2020-07-31 DIAGNOSIS — C50211 Malignant neoplasm of upper-inner quadrant of right female breast: Secondary | ICD-10-CM

## 2020-07-31 DIAGNOSIS — Z95828 Presence of other vascular implants and grafts: Secondary | ICD-10-CM

## 2020-07-31 MED ORDER — ACETAMINOPHEN 325 MG PO TABS
ORAL_TABLET | ORAL | Status: AC
  Filled 2020-07-31: qty 2

## 2020-07-31 MED ORDER — DIPHENHYDRAMINE HCL 25 MG PO CAPS
ORAL_CAPSULE | ORAL | Status: AC
  Filled 2020-07-31: qty 1

## 2020-07-31 MED ORDER — SODIUM CHLORIDE 0.9% FLUSH
3.0000 mL | INTRAVENOUS | Status: AC | PRN
  Administered 2020-07-31: 10 mL
  Filled 2020-07-31: qty 10

## 2020-07-31 MED ORDER — SODIUM CHLORIDE 0.9% FLUSH
10.0000 mL | Freq: Once | INTRAVENOUS | Status: AC
  Administered 2020-07-31: 10 mL
  Filled 2020-07-31: qty 10

## 2020-07-31 MED ORDER — ACETAMINOPHEN 325 MG PO TABS
650.0000 mg | ORAL_TABLET | Freq: Once | ORAL | Status: AC
  Administered 2020-07-31: 650 mg via ORAL

## 2020-07-31 MED ORDER — SODIUM CHLORIDE 0.9% IV SOLUTION
250.0000 mL | Freq: Once | INTRAVENOUS | Status: AC
  Administered 2020-07-31: 250 mL via INTRAVENOUS
  Filled 2020-07-31: qty 250

## 2020-07-31 MED ORDER — SODIUM CHLORIDE 0.9 % IV SOLN
Freq: Once | INTRAVENOUS | Status: AC
  Filled 2020-07-31: qty 250

## 2020-07-31 MED ORDER — DIPHENHYDRAMINE HCL 25 MG PO CAPS
25.0000 mg | ORAL_CAPSULE | Freq: Once | ORAL | Status: AC
  Administered 2020-07-31: 25 mg via ORAL

## 2020-07-31 MED ORDER — HEPARIN SOD (PORK) LOCK FLUSH 100 UNIT/ML IV SOLN
250.0000 [IU] | INTRAVENOUS | Status: DC | PRN
  Filled 2020-07-31: qty 5

## 2020-07-31 MED ORDER — HEPARIN SOD (PORK) LOCK FLUSH 100 UNIT/ML IV SOLN
500.0000 [IU] | Freq: Once | INTRAVENOUS | Status: AC
  Administered 2020-07-31: 500 [IU]
  Filled 2020-07-31: qty 5

## 2020-07-31 NOTE — Patient Instructions (Signed)

## 2020-07-31 NOTE — Telephone Encounter (Signed)
Scheduled per 02/16 los, patient has been called and notified of upcoming appointments.  °

## 2020-07-31 NOTE — Progress Notes (Signed)
Pt HR 120-125 and BP 100/55 post blood transfusion.  Per MD pt needing additional fluids to help with dehydration.  Orders received for pt to receive 500 mL NS over 1 hour.  Orders placed and infusion RN notified.

## 2020-08-01 LAB — TYPE AND SCREEN
ABO/RH(D): B POS
Antibody Screen: NEGATIVE
Unit division: 0

## 2020-08-01 LAB — BPAM RBC
Blood Product Expiration Date: 202203112359
ISSUE DATE / TIME: 202202180938
Unit Type and Rh: 7300

## 2020-08-04 ENCOUNTER — Encounter: Payer: Self-pay | Admitting: *Deleted

## 2020-08-05 ENCOUNTER — Other Ambulatory Visit: Payer: Medicaid Other

## 2020-08-05 ENCOUNTER — Ambulatory Visit: Payer: Medicaid Other | Admitting: Hematology and Oncology

## 2020-08-05 ENCOUNTER — Ambulatory Visit: Payer: Medicaid Other

## 2020-08-05 NOTE — Assessment & Plan Note (Signed)
01/2020:Palpable right breast mass growing since July 2021. Mammogram revealed 2.2 cm right breast mass with a 0.8 cm satellite lesion. 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  Treatment plan 1. Neoadjuvant chemotherapy with Adriamycin and Cytoxanpembrolizumab4 followed by Taxol weekly 12 with carboplatin pembrolizumabevery 3 weeks x4, followed by pembrolizumab maintenance to complete 1 year 2. Followed by breast conserving surgery with targeted axillary dissection 3. Followed by adjuvant radiation therapy -------------------------------------------------------------------------------------------------------------------------------------- Current treatment:Completed 4 cycles ofAdriamycin, Cytoxan, pembrolizumab, today is cycle 2 Taxol carboplatin and pembrolizumab  Echocardiogram 04/06/2020: EF 65% Breast MRI 04/06/2020: Right breast: 2.8 cm mass, indeterminate 0.7 cm mass, bilobed mass in the right axilla 2.9 cm, left breast: 1 cm mass (papilloma), indeterminate 0.9 cm mass  Chemo toxicities: 1.Mild nausea 2.mild fatigue 3.  Diarrhea: Causing tachycardia and hypokalemia, encouraged her potassium containing foods and measures to stop the diarrhea. 4.  Severe anemia: Hemoglobin today is 8.6: Monitoring closely.  This is also partially the cause of her tachycardia. If she gets worse or if hemoglobin gets below 8 then we will transfuse her PRBC.  Return to clinic every week for Taxol.  I will see the patient every other week for follow-up

## 2020-08-05 NOTE — Progress Notes (Signed)
Patient Care Team: Muse, Noel Journey., PA-C as PCP - General Oletta Lamas Garwin Brothers, RN as Oncology Nurse Navigator (Oncology) Mauro Kaufmann, RN as Oncology Nurse Navigator Rockwell Germany, RN as Oncology Nurse Navigator  DIAGNOSIS:    ICD-10-CM   1. Malignant neoplasm of upper-inner quadrant of right breast in female, estrogen receptor positive ([place])  C50.211    Z17.0     SUMMARY OF ONCOLOGIC HISTORY: Oncology History  Malignant neoplasm of upper-inner quadrant of right breast in female, estrogen receptor positive ([place])  01/2020 Initial Diagnosis   Palpable right breast mass growing since July 2021.  Mammogram revealed 2.2 cm right breast mass with a 0.8 cm satellite lesion.  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   03/31/2020 Cancer Staging   Staging form: Breast, AJCC 8th Edition - Clinical stage from 03/31/2020: Stage IIIB (cT2, cN1(f), cM0, G3, ER-, PR-, HER2-) - Signed by Nicholas Lose, MD on 04/02/2020   04/09/2020 -  Chemotherapy    Patient is on Treatment Plan: BREAST AC Q21D / CARBOPLATIN D1 + PACLITAXEL D1,8,15 Q21D        CHIEF COMPLIANT: Cycle4Taxol, Carboplatin, and Keytruda  INTERVAL HISTORY: Sharon Fischer is a 55 y.o. with above-mentioned history of breast cancer currently on neoadjuvant chemotherapy with weekly Taxol and Carboplatin and Keytruda every 3 weeks. She presents to the clinic todayfora toxicity checkandcycle 4.  She does not have any neuropathy symptoms. She however feels fatigued and weak in her legs. She is drinking lots of water. She had diarrhea for 3 days last week but it was only one episode of loose stools. Denies any cramps. Denies any rashes.    ALLERGIES:  has No Known Allergies.  MEDICATIONS:  Current Outpatient Medications  Medication Sig Dispense Refill   atorvastatin (LIPITOR) 40 MG tablet Take by mouth.      fluticasone (FLONASE) 50 MCG/ACT nasal spray Place into both nostrils.     lidocaine-prilocaine (EMLA) cream Apply to affected area once 30 g 3   lisinopril-hydrochlorothiazide (ZESTORETIC) 20-12.5 MG tablet Take 1 tablet by mouth daily.     LORazepam (ATIVAN) 0.5 MG tablet Take 1 tablet (0.5 mg total) by mouth at bedtime as needed for sleep. 30 tablet 0   ondansetron (ZOFRAN) 8 MG tablet Take 1 tablet (8 mg total) by mouth 2 (two) times daily as needed. Start on the third day after chemotherapy. 30 tablet 1   oxybutynin (DITROPAN-XL) 5 MG 24 hr tablet Take 1 tablet (5 mg total) by mouth at bedtime. 30 tablet 1   potassium chloride SA (KLOR-CON) 20 MEQ tablet Take 1 tablet (20 mEq total) by mouth daily. 14 tablet 0   prochlorperazine (COMPAZINE) 10 MG tablet Take 1 tablet (10 mg total) by mouth every 6 (six) hours as needed (Nausea or vomiting). 30 tablet 1   No current facility-administered medications for this visit.    PHYSICAL EXAMINATION: ECOG PERFORMANCE STATUS: 1 - Symptomatic but completely ambulatory  Vitals:   08/06/20 0918  BP: 102/64  Pulse: (!) 132  Resp: 17  Temp: 97.7 F (36.5 C)  SpO2: 97%   Filed Weights   08/06/20 0918  Weight: 157 lb 11.2 oz (71.5 kg)     LABORATORY DATA:  I have reviewed the data as listed CMP Latest Ref Rng & Units 07/29/2020 07/22/2020 07/15/2020  Glucose 70 - 99 mg/dL 101(H) 105(H)  97  BUN 6 - 20 mg/dL '6 6 9  ' Creatinine 0.44 - 1.00 mg/dL 0.63 0.62 0.70  Sodium 135 - 145 mmol/L 133(L) 131(L) 136  Potassium 3.5 - 5.1 mmol/L 3.2(L) 3.4(L) 3.4(L)  Chloride 98 - 111 mmol/L 102 100 101  CO2 22 - 32 mmol/L '23 25 26  ' Calcium 8.9 - 10.3 mg/dL 8.9 8.7(L) 9.4  Total Protein 6.5 - 8.1 g/dL 7.2 7.2 7.5  Total Bilirubin 0.3 - 1.2 mg/dL 0.8 0.7 0.4  Alkaline Phos 38 - 126 U/L 111 113 120  AST 15 - 41 U/L 29 23 44(H)  ALT 0 - 44 U/L 24 32 78(H)    Lab Results  Component Value Date   WBC 3.6 (L) 08/06/2020   HGB 9.0 (L) 08/06/2020   HCT  27.1 (L) 08/06/2020   MCV 91.9 08/06/2020   PLT 235 08/06/2020   NEUTROABS PENDING 08/06/2020    ASSESSMENT & PLAN:  Malignant neoplasm of upper-inner quadrant of right breast in female, estrogen receptor positive ([place]) 01/2020:Palpable right breast mass growing since July 2021. Mammogram revealed 2.2 cm right breast mass with a 0.8 cm satellite lesion. 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  Treatment plan 1. Neoadjuvant chemotherapy with Adriamycin and Cytoxanpembrolizumab4 followed by Taxol weekly 12 with carboplatin pembrolizumabevery 3 weeks x4, followed by pembrolizumab maintenance to complete 1 year 2. Followed by breast conserving surgery with targeted axillary dissection 3. Followed by adjuvant radiation therapy -------------------------------------------------------------------------------------------------------------------------------------- Current treatment:Completed 4 cycles ofAdriamycin, Cytoxan, pembrolizumab, today is cycle 4 Taxol  (carbo and pembrolizumab every 3 weeks)  Echocardiogram 04/06/2020: EF 65% Breast MRI 04/06/2020: Right breast: 2.8 cm mass, indeterminate 0.7 cm mass, bilobed mass in the right axilla 2.9 cm, left breast: 1 cm mass (papilloma), indeterminate 0.9 cm mass  Chemo toxicities: 1.Mild nausea 2.mild fatigue 3.  Diarrhea: Causing tachycardia and hypokalemia, encouraged her potassium containing foods and measures to stop the diarrhea. 4.  Severe anemia: Hemoglobin today is 9: Monitoring closely. She received blood transfusion last week. 5. Tachycardia: I added 1 L of normal saline to each of her chemotherapy treatments.  Discontinued carboplatin from last week due to thrombocytopenia with a platelet count went to 27.  Today the platelet count improved to 235. Today she will receive just Taxol.  Return to clinic  every week for Taxol.  I will see the patient every other week for follow-up     No orders of the defined types were placed in this encounter.  The patient has a good understanding of the overall plan. she agrees with it. she will call with any problems that may develop before the next visit here.  Total time spent: 30 mins including face to face time and time spent for planning, charting and coordination of care  Rulon Eisenmenger, MD, MPH 08/06/2020  I, Cloyde Reams Dorshimer, am acting as scribe for Dr. Nicholas Lose.  I have reviewed the above documentation for accuracy and completeness, and I agree with the above.

## 2020-08-06 ENCOUNTER — Encounter: Payer: Self-pay | Admitting: *Deleted

## 2020-08-06 ENCOUNTER — Other Ambulatory Visit: Payer: Self-pay

## 2020-08-06 ENCOUNTER — Inpatient Hospital Stay: Payer: Medicaid Other

## 2020-08-06 ENCOUNTER — Inpatient Hospital Stay (HOSPITAL_BASED_OUTPATIENT_CLINIC_OR_DEPARTMENT_OTHER): Payer: Medicaid Other | Admitting: Hematology and Oncology

## 2020-08-06 DIAGNOSIS — C50211 Malignant neoplasm of upper-inner quadrant of right female breast: Secondary | ICD-10-CM

## 2020-08-06 DIAGNOSIS — Z5112 Encounter for antineoplastic immunotherapy: Secondary | ICD-10-CM | POA: Diagnosis not present

## 2020-08-06 DIAGNOSIS — Z170 Estrogen receptor positive status [ER+]: Secondary | ICD-10-CM

## 2020-08-06 LAB — CBC WITH DIFFERENTIAL (CANCER CENTER ONLY)
Abs Immature Granulocytes: 0.02 10*3/uL (ref 0.00–0.07)
Basophils Absolute: 0.1 10*3/uL (ref 0.0–0.1)
Basophils Relative: 1 %
Eosinophils Absolute: 0.4 10*3/uL (ref 0.0–0.5)
Eosinophils Relative: 11 %
HCT: 27.1 % — ABNORMAL LOW (ref 36.0–46.0)
Hemoglobin: 9 g/dL — ABNORMAL LOW (ref 12.0–15.0)
Immature Granulocytes: 1 %
Lymphocytes Relative: 24 %
Lymphs Abs: 0.9 10*3/uL (ref 0.7–4.0)
MCH: 30.5 pg (ref 26.0–34.0)
MCHC: 33.2 g/dL (ref 30.0–36.0)
MCV: 91.9 fL (ref 80.0–100.0)
Monocytes Absolute: 0.7 10*3/uL (ref 0.1–1.0)
Monocytes Relative: 19 %
Neutro Abs: 1.6 10*3/uL — ABNORMAL LOW (ref 1.7–7.7)
Neutrophils Relative %: 44 %
Platelet Count: 235 10*3/uL (ref 150–400)
RBC: 2.95 MIL/uL — ABNORMAL LOW (ref 3.87–5.11)
RDW: 16.8 % — ABNORMAL HIGH (ref 11.5–15.5)
WBC Count: 3.6 10*3/uL — ABNORMAL LOW (ref 4.0–10.5)
nRBC: 0 % (ref 0.0–0.2)

## 2020-08-06 LAB — CMP (CANCER CENTER ONLY)
ALT: 23 U/L (ref 0–44)
AST: 29 U/L (ref 15–41)
Albumin: 3.1 g/dL — ABNORMAL LOW (ref 3.5–5.0)
Alkaline Phosphatase: 113 U/L (ref 38–126)
Anion gap: 12 (ref 5–15)
BUN: <4 mg/dL — ABNORMAL LOW (ref 6–20)
CO2: 20 mmol/L — ABNORMAL LOW (ref 22–32)
Calcium: 9 mg/dL (ref 8.9–10.3)
Chloride: 99 mmol/L (ref 98–111)
Creatinine: 0.66 mg/dL (ref 0.44–1.00)
GFR, Estimated: >60 mL/min (ref >60)
Glucose, Bld: 95 mg/dL (ref 70–99)
Potassium: 3.6 mmol/L (ref 3.5–5.1)
Sodium: 131 mmol/L — ABNORMAL LOW (ref 135–145)
Total Bilirubin: 0.6 mg/dL (ref 0.3–1.2)
Total Protein: 7.1 g/dL (ref 6.5–8.1)

## 2020-08-06 MED ORDER — FAMOTIDINE IN NACL 20-0.9 MG/50ML-% IV SOLN
INTRAVENOUS | Status: AC
  Filled 2020-08-06: qty 50

## 2020-08-06 MED ORDER — SODIUM CHLORIDE 0.9 % IV SOLN: 50.0000 mg/m2 | Freq: Once | INTRAVENOUS | Status: DC

## 2020-08-06 MED ORDER — HEPARIN SOD (PORK) LOCK FLUSH 100 UNIT/ML IV SOLN
500.0000 [IU] | Freq: Once | INTRAVENOUS | Status: AC | PRN
  Administered 2020-08-06: 500 [IU]
  Filled 2020-08-06: qty 5

## 2020-08-06 MED ORDER — DIPHENHYDRAMINE HCL 50 MG/ML IJ SOLN
INTRAMUSCULAR | Status: AC
  Filled 2020-08-06: qty 1

## 2020-08-06 MED ORDER — FAMOTIDINE IN NACL 20-0.9 MG/50ML-% IV SOLN
20.0000 mg | Freq: Once | INTRAVENOUS | Status: AC
  Administered 2020-08-06: 20 mg via INTRAVENOUS

## 2020-08-06 MED ORDER — PACLITAXEL CHEMO INJECTION 300 MG/50ML
50.0000 mg/m2 | Freq: Once | INTRAVENOUS | Status: AC
  Administered 2020-08-06: 90 mg via INTRAVENOUS
  Filled 2020-08-06: qty 15

## 2020-08-06 MED ORDER — SODIUM CHLORIDE 0.9% FLUSH
10.0000 mL | INTRAVENOUS | Status: DC | PRN
  Administered 2020-08-06: 10 mL
  Filled 2020-08-06: qty 10

## 2020-08-06 MED ORDER — DIPHENHYDRAMINE HCL 50 MG/ML IJ SOLN
50.0000 mg | Freq: Once | INTRAMUSCULAR | Status: AC
  Administered 2020-08-06: 50 mg via INTRAVENOUS

## 2020-08-06 MED ORDER — SODIUM CHLORIDE 0.9 % IV SOLN
Freq: Once | INTRAVENOUS | Status: AC
  Administered 2020-08-06: 999 mL/h via INTRAVENOUS
  Filled 2020-08-06: qty 250

## 2020-08-06 NOTE — Progress Notes (Signed)
Ok to use today's weight for Taxol dose calculation. Dose will change to 92 mg. Discussed w/ Dr. Lindi Adie.  Kennith Center, Pharm.D., CPP 08/06/2020@10 :53 AM

## 2020-08-06 NOTE — Progress Notes (Signed)
Patient with weight loss received orders to decrease dose of Taxol to 92 mg (50 mg/m2 Epic rounded dose from 92 mg to 90 mg).  Orders updated to reflect new dose as calculated by EPIC.  T.O. Dr Jeannene Patella, PharmD 08/06/20 @ [phone]

## 2020-08-06 NOTE — Progress Notes (Signed)
Per Dr. Nicholas Lose ok to treat with elevated HR. 1L of Fluids added to treatment plan for today.    Halfway through treatment patient became nauseated and vomited x 1 during infusion. Dr. Lindi Adie and his nurse Merleen Nicely notified. Attempted to call desk twice with no response.   Closer to the end of treatment patient stated she started to feel better and wanted to go home rather than wait for new orders. Patient directed to make sure she takes home nausea medication when she gets home.

## 2020-08-06 NOTE — Progress Notes (Signed)
Per MD okay to treat with BP 94/53 and HR 120.  Pt to receive 1 L NS with tx today and all future txs as well.

## 2020-08-06 NOTE — Patient Instructions (Signed)
[facility] Discharge Instructions for Patients Receiving Chemotherapy  Today you received the following chemotherapy agents:  Taxol.  To help prevent nausea and vomiting after your treatment, we encourage you to take your nausea medication as directed.   If you develop nausea and vomiting that is not controlled by your nausea medication, call the clinic.   BELOW ARE SYMPTOMS THAT SHOULD BE REPORTED IMMEDIATELY:  *FEVER GREATER THAN 100.5 F  *CHILLS WITH OR WITHOUT FEVER  NAUSEA AND VOMITING THAT IS NOT CONTROLLED WITH YOUR NAUSEA MEDICATION  *UNUSUAL SHORTNESS OF BREATH  *UNUSUAL BRUISING OR BLEEDING  TENDERNESS IN MOUTH AND THROAT WITH OR WITHOUT PRESENCE OF ULCERS  *URINARY PROBLEMS  *BOWEL PROBLEMS  UNUSUAL RASH Items with * indicate a potential emergency and should be followed up as soon as possible.  Feel free to call the clinic should you have any questions or concerns. The clinic phone number is [phone].  Please show the CHEMO ALERT CARD at check-in to the Emergency Department and triage nurse.   

## 2020-08-12 ENCOUNTER — Ambulatory Visit: Payer: Medicaid Other | Admitting: Hematology and Oncology

## 2020-08-12 ENCOUNTER — Ambulatory Visit (HOSPITAL_COMMUNITY)
Admission: RE | Admit: 2020-08-12 | Discharge: 2020-08-12 | Disposition: A | Discharge status: Home / Self Care | Payer: Medicaid Other | Source: Ambulatory Visit | Attending: Medical | Admitting: Medical

## 2020-08-12 ENCOUNTER — Other Ambulatory Visit: Payer: Self-pay

## 2020-08-12 ENCOUNTER — Inpatient Hospital Stay: Payer: Medicaid Other

## 2020-08-12 ENCOUNTER — Inpatient Hospital Stay: Payer: Medicaid Other | Attending: Hematology and Oncology

## 2020-08-12 ENCOUNTER — Other Ambulatory Visit: Payer: Medicaid Other

## 2020-08-12 ENCOUNTER — Inpatient Hospital Stay (HOSPITAL_BASED_OUTPATIENT_CLINIC_OR_DEPARTMENT_OTHER): Payer: Medicaid Other | Admitting: Medical

## 2020-08-12 ENCOUNTER — Other Ambulatory Visit: Payer: Self-pay | Admitting: Medical

## 2020-08-12 VITALS — BP 92/45 | HR 117 | Temp 98.1°F | Resp 20 | Wt 152.5 lb

## 2020-08-12 DIAGNOSIS — F17210 Nicotine dependence, cigarettes, uncomplicated: Secondary | ICD-10-CM | POA: Insufficient documentation

## 2020-08-12 DIAGNOSIS — Z170 Estrogen receptor positive status [ER+]: Secondary | ICD-10-CM

## 2020-08-12 DIAGNOSIS — R059 Cough, unspecified: Secondary | ICD-10-CM | POA: Diagnosis present

## 2020-08-12 DIAGNOSIS — J189 Pneumonia, unspecified organism: Secondary | ICD-10-CM

## 2020-08-12 DIAGNOSIS — Z95828 Presence of other vascular implants and grafts: Secondary | ICD-10-CM

## 2020-08-12 DIAGNOSIS — C50211 Malignant neoplasm of upper-inner quadrant of right female breast: Secondary | ICD-10-CM | POA: Diagnosis not present

## 2020-08-12 DIAGNOSIS — Z79899 Other long term (current) drug therapy: Secondary | ICD-10-CM | POA: Diagnosis not present

## 2020-08-12 DIAGNOSIS — G629 Polyneuropathy, unspecified: Secondary | ICD-10-CM | POA: Insufficient documentation

## 2020-08-12 DIAGNOSIS — Z7952 Long term (current) use of systemic steroids: Secondary | ICD-10-CM | POA: Diagnosis not present

## 2020-08-12 LAB — CBC WITH DIFFERENTIAL (CANCER CENTER ONLY)
Abs Immature Granulocytes: 0.02 10*3/uL (ref 0.00–0.07)
Basophils Absolute: 0.1 10*3/uL (ref 0.0–0.1)
Basophils Relative: 1 %
Eosinophils Absolute: 0.4 10*3/uL (ref 0.0–0.5)
Eosinophils Relative: 8 %
HCT: 23.9 % — ABNORMAL LOW (ref 36.0–46.0)
Hemoglobin: 7.9 g/dL — ABNORMAL LOW (ref 12.0–15.0)
Immature Granulocytes: 0 %
Lymphocytes Relative: 16 %
Lymphs Abs: 0.8 10*3/uL (ref 0.7–4.0)
MCH: 30.7 pg (ref 26.0–34.0)
MCHC: 33.1 g/dL (ref 30.0–36.0)
MCV: 93 fL (ref 80.0–100.0)
Monocytes Absolute: 0.9 10*3/uL (ref 0.1–1.0)
Monocytes Relative: 17 %
Neutro Abs: 3 10*3/uL (ref 1.7–7.7)
Neutrophils Relative %: 58 %
Platelet Count: 345 10*3/uL (ref 150–400)
RBC: 2.57 MIL/uL — ABNORMAL LOW (ref 3.87–5.11)
RDW: 17.3 % — ABNORMAL HIGH (ref 11.5–15.5)
WBC Count: 5.2 10*3/uL (ref 4.0–10.5)
nRBC: 0 % (ref 0.0–0.2)

## 2020-08-12 LAB — CMP (CANCER CENTER ONLY)
ALT: 19 U/L (ref 0–44)
AST: 27 U/L (ref 15–41)
Albumin: 2.8 g/dL — ABNORMAL LOW (ref 3.5–5.0)
Alkaline Phosphatase: 92 U/L (ref 38–126)
Anion gap: 11 (ref 5–15)
BUN: 7 mg/dL (ref 6–20)
CO2: 20 mmol/L — ABNORMAL LOW (ref 22–32)
Calcium: 9 mg/dL (ref 8.9–10.3)
Chloride: 100 mmol/L (ref 98–111)
Creatinine: 0.75 mg/dL (ref 0.44–1.00)
GFR, Estimated: >60 mL/min (ref >60)
Glucose, Bld: 106 mg/dL — ABNORMAL HIGH (ref 70–99)
Potassium: 3.6 mmol/L (ref 3.5–5.1)
Sodium: 131 mmol/L — ABNORMAL LOW (ref 135–145)
Total Bilirubin: 0.5 mg/dL (ref 0.3–1.2)
Total Protein: 6.8 g/dL (ref 6.5–8.1)

## 2020-08-12 MED ORDER — SULFAMETHOXAZOLE-TRIMETHOPRIM 800-160 MG PO TABS: 1.0000 | ORAL_TABLET | Freq: Two times a day (BID) | ORAL | 0 refills | Status: DC

## 2020-08-12 MED ORDER — HEPARIN SOD (PORK) LOCK FLUSH 100 UNIT/ML IV SOLN
500.0000 [IU] | Freq: Once | INTRAVENOUS | Status: AC
  Administered 2020-08-12: 500 [IU]
  Filled 2020-08-12: qty 5

## 2020-08-12 MED ORDER — SODIUM CHLORIDE 0.9% FLUSH
10.0000 mL | Freq: Once | INTRAVENOUS | Status: AC
  Administered 2020-08-12: 10 mL
  Filled 2020-08-12: qty 10

## 2020-08-12 MED ORDER — PREDNISONE 10 MG PO TABS: ORAL_TABLET | ORAL | 0 refills | Status: DC

## 2020-08-12 MED ORDER — SODIUM CHLORIDE 0.9% FLUSH
10.0000 mL | Freq: Once | INTRAVENOUS | Status: AC
Start: 2020-08-12 — End: 2020-08-12
  Administered 2020-08-12: 10 mL
  Filled 2020-08-12: qty 10

## 2020-08-12 MED ORDER — GUAIFENESIN-CODEINE 100-10 MG/5ML PO SOLN: 5.0000 mL | ORAL | 0 refills | Status: DC | PRN

## 2020-08-12 MED ORDER — SODIUM CHLORIDE 0.9 % IV SOLN
INTRAVENOUS | Status: DC
  Filled 2020-08-12 (×2): qty 250

## 2020-08-12 MED ORDER — ALBUTEROL SULFATE HFA 108 (90 BASE) MCG/ACT IN AERS: 1.0000 | INHALATION_SPRAY | RESPIRATORY_TRACT | 2 refills | Status: DC | PRN

## 2020-08-12 NOTE — Patient Instructions (Signed)
Implanted Port Insertion, Care After This sheet gives you information about how to care for yourself after your procedure. Your health care provider may also give you more specific instructions. If you have problems or questions, contact your health care provider. What can I expect after the procedure? After the procedure, it is common to have:  Discomfort at the port insertion site.  Bruising on the skin over the port. This should improve over 3-4 days. Follow these instructions at home: Port care  After your port is placed, you will get a manufacturer's information card. The card has information about your port. Keep this card with you at all times.  Take care of the port as told by your health care provider. Ask your health care provider if you or a family member can get training for taking care of the port at home. A home health care nurse may also take care of the port.  Make sure to remember what type of port you have. Incision care  Follow instructions from your health care provider about how to take care of your port insertion site. Make sure you: ? Wash your hands with soap and water before and after you change your bandage (dressing). If soap and water are not available, use hand sanitizer. ? Change your dressing as told by your health care provider. ? Leave stitches (sutures), skin glue, or adhesive strips in place. These skin closures may need to stay in place for 2 weeks or longer. If adhesive strip edges start to loosen and curl up, you may trim the loose edges. Do not remove adhesive strips completely unless your health care provider tells you to do that.  Check your port insertion site every day for signs of infection. Check for: ? Redness, swelling, or pain. ? Fluid or blood. ? Warmth. ? Pus or a bad smell.      Activity  Return to your normal activities as told by your health care provider. Ask your health care provider what activities are safe for you.  Do not  lift anything that is heavier than 10 lb (4.5 kg), or the limit that you are told, until your health care provider says that it is safe. General instructions  Take over-the-counter and prescription medicines only as told by your health care provider.  Do not take baths, swim, or use a hot tub until your health care provider approves. Ask your health care provider if you may take showers. You may only be allowed to take sponge baths.  Do not drive for 24 hours if you were given a sedative during your procedure.  Wear a medical alert bracelet in case of an emergency. This will tell any health care providers that you have a port.  Keep all follow-up visits as told by your health care provider. This is important. Contact a health care provider if:  You cannot flush your port with saline as directed, or you cannot draw blood from the port.  You have a fever or chills.  You have redness, swelling, or pain around your port insertion site.  You have fluid or blood coming from your port insertion site.  Your port insertion site feels warm to the touch.  You have pus or a bad smell coming from the port insertion site. Get help right away if:  You have chest pain or shortness of breath.  You have bleeding from your port that you cannot control. Summary  Take care of the port as told by your   health care provider. Keep the manufacturer's information card with you at all times.  Change your dressing as told by your health care provider.  Contact a health care provider if you have a fever or chills or if you have redness, swelling, or pain around your port insertion site.  Keep all follow-up visits as told by your health care provider. This information is not intended to replace advice given to you by your health care provider. Make sure you discuss any questions you have with your health care provider. Document Revised: 12/26/2017 Document Reviewed: 12/26/2017 Elsevier Patient Education   2021 [employer]vier Inc.  

## 2020-08-12 NOTE — Progress Notes (Signed)
Patient presented to treatment today feeling poorly. Verbalizes increased fatigue, decreased appetite, intermittent N/V, productive cough with green phlegm, and dyspnea on exertion. Noted significant weight loss, low BP, and elevated HR. Dr. Lindi Adie aware and advised to hold treatment today and have Sandi Mealy, PA-C see patient in the infusion room. Sandi Mealy examined patient. Orders received. After IV bolus, patient verbalized feeling better. Sandi Mealy, PA-C returned to discuss CXR and treatment plan. Patient verbalized understanding and agreed to plan of care. Discharged with no further incident.

## 2020-08-13 NOTE — Progress Notes (Signed)
[facility] Progress Note   Sharon Fischer [id number] 21-Apr-1966 55 y.o.  [name] is managed by Dr. Nicholas Lose  Actively treated with chemotherapy/immunotherapy/hormonal therapy: yes  Current therapy: Taxol and Keytruda  Last treated: 07/29/2020 (cycle 6, day 1)  Next scheduled appointment with provider: 08/19/2020  Assessment: Plan:    Pneumonitis - Plan: sulfamethoxazole-trimethoprim (BACTRIM DS) 800-160 MG tablet, predniSONE (DELTASONE) 10 MG tablet, albuterol (VENTOLIN HFA) 108 (90 Base) MCG/ACT inhaler, guaiFENesin-codeine 100-10 MG/5ML syrup  Malignant neoplasm of upper-inner quadrant of right breast in female, estrogen receptor positive ([place])   Pneumonitis: Patient was referred for chest x-ray which returned showing:  FINDINGS: PowerPort catheter noted with tip over SVC. Heart size normal. Mild bibasilar interstitial prominence. Although these changes may be chronic, interstitial edema, pneumonitis, or other etiologies of interstitial prominence including interstitial tumor spread cannot be excluded. Small right pleural effusion. No pneumothorax. No acute bony abnormality. Radiopaque marker noted in the right breast.  IMPRESSION: 1. PowerPort catheter with tip over SVC. 2. Mild bibasilar interstitial prominence. Although these changes may be chronic, interstitial edema, pneumonitis, or other etiologies of interstitial prominence including interstitial tumor spread cannot be excluded. Small right pleural effusion.  The patient was placed on Bactrim DS p.o. twice daily x7 days, a 12-day prednisone taper, and albuterol inhaler, and Robitussin with codeine.   ER positive malignant neoplasm of the right breast: The patient is status post cycle 6, day 1 of Taxol and Keytruda which was dosed on 07/29/2020.  She is scheduled to return for consideration of her next cycle of chemotherapy and for follow-up with Dr. Nicholas Lose on  08/19/2020.   Please see After Visit Summary for patient specific instructions.  Future Appointments  Date Time Provider [place]  08/19/2020  1:00 PM [facility] None  08/19/2020  1:15 PM [facility] [place] FLUSH [facility] None  08/19/2020  2:00 PM Nicholas Lose, MD [facility] None  08/19/2020  2:15 PM [facility] INFUSION [facility] None  08/26/2020  8:00 AM [facility] None  08/26/2020  8:15 AM [facility] [place] FLUSH [facility] None  08/26/2020  8:45 AM Nicholas Lose, MD [facility] None  08/26/2020  9:30 AM [facility] INFUSION [facility] None  08/26/2020 11:15 AM Morrell Riddle, RD [facility] None  09/02/2020  8:15 AM [facility] None  09/02/2020  8:30 AM [facility] [place] FLUSH [facility] None  09/02/2020  9:30 AM [facility] INFUSION [facility] None    No orders of the defined types were placed in this encounter.      Subjective:   Patient ID:  Sharon Fischer is a 55 y.o. (DOB 03-17-1966) female.  Chief Complaint: No chief complaint on file.   HPI Sharon Fischer  is a 55 y.o. female with a diagnosis of an ER positive malignant neoplasm of the right breast.  She is followed by Dr. Nicholas Lose and is status post cycle 6, day 1 of Taxol and Keytruda which was dosed on 07/29/2020.  She was seen today an infusion.  She reported fatigue, anorexia, intermittent nausea and vomiting, a productive cough with greenish sputum and dyspnea on exertion.  Her blood pressure was noted to be low at 94/52 with a pulse rate of 120.  Her oxygen saturation was noted to be 98% on room air.  [name] was referred for a chest x-ray which returned showing:  FINDINGS: PowerPort catheter noted with tip over SVC. Heart size normal. Mild bibasilar interstitial prominence. Although these changes may be chronic, interstitial edema, pneumonitis, or other  etiologies of interstitial prominence including interstitial tumor spread cannot be excluded. Small right  pleural effusion. No pneumothorax. No acute bony abnormality. Radiopaque marker noted in the right breast.  IMPRESSION: 1. PowerPort catheter with tip over SVC. 2. Mild bibasilar interstitial prominence. Although these changes may be chronic, interstitial edema, pneumonitis, or other etiologies of interstitial prominence including interstitial tumor spread cannot be excluded. Small right pleural effusion.   Medications: I have reviewed the patient's current medications.  Allergies: No Known Allergies  Past Medical History:  Diagnosis Date  . Breast cancer ([place]) 03/2020  . Family history of throat cancer     Past Surgical History:  Procedure Laterality Date  . IR IMAGING GUIDED PORT INSERTION  03/30/2020    Family History  Problem Relation Age of Onset  . Throat cancer Maternal Uncle        dx >50, smoker  . Throat cancer Maternal Uncle        dx >50, smoker  . Cancer Cousin        unknown type, dx >50 (paternal first cousin)  . Cancer Cousin        unknown type (paternal first cousin)    Social History   Socioeconomic History  . Marital status: Single    Spouse name: Not on file  . Number of children: Not on file  . Years of education: Not on file  . Highest education level: Not on file  Occupational History  . Not on file  Tobacco Use  . Smoking status: Current Every Day Smoker    Types: Cigarettes  . Smokeless tobacco: Never Used  . Tobacco comment: 7-8 cigs /day  Substance and Sexual Activity  . Alcohol use: Never  . Drug use: Never  . Sexual activity: Not on file  Other Topics Concern  . Not on file  Social History Narrative  . Not on file   Social Determinants of Health   Financial Resource Strain: Not on file  Food Insecurity: No Food Insecurity  . Worried About Charity fundraiser in the Last Year: Never true  . Ran Out of Food in the Last Year: Never true  Transportation Needs: No Transportation Needs  . Lack of Transportation (Medical):  No  . Lack of Transportation (Non-Medical): No  Physical Activity: Not on file  Stress: Not on file  Social Connections: Not on file  Intimate Partner Violence: Not on file    Past Medical History, Surgical history, Social history, and Family history were reviewed and updated as appropriate.   Please see review of systems for further details on the patient's review from today.   Review of Systems:  Review of Systems  Constitutional: Positive for appetite change and fatigue. Negative for chills, diaphoresis and fever.  HENT: Negative for trouble swallowing.   Respiratory: Positive for cough and shortness of breath. Negative for choking, chest tightness, wheezing and stridor.   Cardiovascular: Negative for chest pain and palpitations.  Gastrointestinal: Positive for nausea and vomiting.    Objective:   Physical Exam:  There were no vitals taken for this visit. ECOG: 1  Physical Exam Constitutional:      General: She is not in acute distress.    Appearance: She is not diaphoretic.  HENT:     Head: Normocephalic and atraumatic.     Right Ear: External ear normal.     Left Ear: External ear normal.     Mouth/Throat:     Mouth: Oropharynx is clear and moist.  Pharynx: No oropharyngeal exudate.  Eyes:     General: No scleral icterus.       Right eye: No discharge.        Left eye: No discharge.     Conjunctiva/sclera: Conjunctivae normal.  Cardiovascular:     Rate and Rhythm: Normal rate and regular rhythm.     Heart sounds: Normal heart sounds. No murmur heard. No friction rub. No gallop.   Pulmonary:     Effort: Pulmonary effort is normal. No respiratory distress.     Breath sounds: Examination of the right-upper field reveals rhonchi. Examination of the right-middle field reveals rhonchi. Examination of the right-lower field reveals rhonchi. Rhonchi present. No wheezing or rales.  Musculoskeletal:     Cervical back: Normal range of motion and neck supple.   Lymphadenopathy:     Cervical: No cervical adenopathy.  Skin:    General: Skin is warm and dry.     Findings: No erythema or rash.  Neurological:     Mental Status: She is alert.     Coordination: Coordination normal.     Gait: Gait normal.  Psychiatric:        Mood and Affect: Mood and affect normal.        Behavior: Behavior normal.        Thought Content: Thought content normal.        Judgment: Judgment normal.     Lab Review:     Component Value Date/Time   NA 131 (L) 08/12/2020 1142   K 3.6 08/12/2020 1142   CL 100 08/12/2020 1142   CO2 20 (L) 08/12/2020 1142   GLUCOSE 106 (H) 08/12/2020 1142   BUN 7 08/12/2020 1142   CREATININE 0.75 08/12/2020 1142   CALCIUM 9.0 08/12/2020 1142   PROT 6.8 08/12/2020 1142   ALBUMIN 2.8 (L) 08/12/2020 1142   AST 27 08/12/2020 1142   ALT 19 08/12/2020 1142   ALKPHOS 92 08/12/2020 1142   BILITOT 0.5 08/12/2020 1142   GFRNONAA >60 08/12/2020 1142   GFRAA  02/05/2007 1044    >60        The eGFR has been calculated using the MDRD equation. This calculation has not been validated in all clinical       Component Value Date/Time   WBC 5.2 08/12/2020 1142   WBC 5.4 02/05/2007 1044   RBC 2.57 (L) 08/12/2020 1142   HGB 7.9 (L) 08/12/2020 1142   HCT 23.9 (L) 08/12/2020 1142   PLT 345 08/12/2020 1142   MCV 93.0 08/12/2020 1142   MCH 30.7 08/12/2020 1142   MCHC 33.1 08/12/2020 1142   RDW 17.3 (H) 08/12/2020 1142   LYMPHSABS 0.8 08/12/2020 1142   MONOABS 0.9 08/12/2020 1142   EOSABS 0.4 08/12/2020 1142   BASOSABS 0.1 08/12/2020 1142   -------------------------------  Imaging from last 24 hours (if applicable):  Radiology interpretation: DG Chest 2 View  Result Date: 08/12/2020 CLINICAL DATA:  Breast cancer.  Productive cough. EXAM: CHEST - 2 VIEW COMPARISON:  Right breast mammogram 04/14/2020. FINDINGS: PowerPort catheter noted with tip over SVC. Heart size normal. Mild bibasilar interstitial prominence. Although these  changes may be chronic, interstitial edema, pneumonitis, or other etiologies of interstitial prominence including interstitial tumor spread cannot be excluded. Small right pleural effusion. No pneumothorax. No acute bony abnormality. Radiopaque marker noted in the right breast. IMPRESSION: 1. PowerPort catheter with tip over SVC. 2. Mild bibasilar interstitial prominence. Although these changes may be chronic, interstitial edema, pneumonitis, or other etiologies  of interstitial prominence including interstitial tumor spread cannot be excluded. Small right pleural effusion. Electronically Signed   By: Marcello Moores  Register   On: 08/12/2020 14:10        This case was discussed with Dr. Lindi Adie. He expresses agreement with my management of this patient.

## 2020-08-18 ENCOUNTER — Other Ambulatory Visit: Payer: Self-pay

## 2020-08-18 DIAGNOSIS — C50211 Malignant neoplasm of upper-inner quadrant of right female breast: Secondary | ICD-10-CM

## 2020-08-18 MED FILL — Fosaprepitant Dimeglumine For IV Infusion 150 MG (Base Eq): INTRAVENOUS | Qty: 5 | Status: AC

## 2020-08-18 NOTE — Progress Notes (Signed)
Patient Care Team: Muse, Noel Journey., PA-C as PCP - Leonard Downing, RN as Oncology Nurse Navigator (Oncology) Mauro Kaufmann, RN as Oncology Nurse Navigator Rockwell Germany, RN as Oncology Nurse Navigator  DIAGNOSIS:    ICD-10-CM   1. Malignant neoplasm of upper-inner quadrant of right breast in female, estrogen receptor positive ([place])  C50.211 MR BREAST BILATERAL W WO CONTRAST INC CAD   Z17.0 heparin lock flush 100 unit/mL    sodium chloride flush (NS) 0.9 % injection 10 mL    0.9 %  sodium chloride infusion  2. Port-A-Cath in place  Z95.828 heparin lock flush 100 unit/mL    sodium chloride flush (NS) 0.9 % injection 10 mL    0.9 %  sodium chloride infusion    SUMMARY OF ONCOLOGIC HISTORY: Oncology History  Malignant neoplasm of upper-inner quadrant of right breast in female, estrogen receptor positive ([place])  01/2020 Initial Diagnosis   Palpable right breast mass growing since July 2021.  Mammogram revealed 2.2 cm right breast mass with a 0.8 cm satellite lesion.  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   03/31/2020 Cancer Staging   Staging form: Breast, AJCC 8th Edition - Clinical stage from 03/31/2020: Stage IIIB (cT2, cN1(f), cM0, G3, ER-, PR-, HER2-) - Signed by Nicholas Lose, MD on 04/02/2020   04/09/2020 -  Chemotherapy    Patient is on Treatment Plan: BREAST AC Q21D / CARBOPLATIN D1 + PACLITAXEL D1,8,15 Q21D        CHIEF COMPLIANT: Cycle5Taxol(chemo being discontinued)  INTERVAL HISTORY: Sharon Fischer is a 55 y.o. with above-mentioned history of breast cancer currently on neoadjuvant chemotherapy withweekly Taxol and Carboplatin andKeytrudaevery 3 weeks. She presents to the clinic todayfora toxicity checkandcycle5.  She had multiple troubles from chemotherapy.  She had severe thrombocytopenia due to carboplatin and [place] was  discontinued previously.  She developed bilateral lung infiltrates from pneumonitis and hence the B[place] Fl[place] was discontinued.  She is currently on a prednisone taper.  She also started developing neuropathy and pain in the fingers and darkening discoloration.  Therefore we decided to discontinue further chemotherapy.  ALLERGIES:  has No Known Allergies.  MEDICATIONS:  Current Outpatient Medications  Medication Sig Dispense Refill  . albuterol (VENTOLIN HFA) 108 (90 Base) MCG/ACT inhaler Inhale 1-2 puffs into the lungs every 4 (four) hours as needed for wheezing or shortness of breath. 8 g 2  . atorvastatin (LIPITOR) 40 MG tablet Take by mouth.    . fluticasone (FLONASE) 50 MCG/ACT nasal spray Place into both nostrils.    Marland Kitchen guaiFENesin-codeine 100-10 MG/5ML syrup Take 5 mLs by mouth every 4 (four) hours as needed for cough. 180 mL 0  . lidocaine-prilocaine (EMLA) cream Apply to affected area once 30 g 3  . lisinopril-hydrochlorothiazide (ZESTORETIC) 20-12.5 MG tablet Take 1 tablet by mouth daily.    Marland Kitchen LORazepam (ATIVAN) 0.5 MG tablet Take 1 tablet (0.5 mg total) by mouth at bedtime as needed for sleep. 30 tablet 0  . ondansetron (ZOFRAN) 8 MG tablet Take 1 tablet (8 mg total) by mouth 2 (two) times daily as needed. Start on the third day after chemotherapy. 30 tablet 1  . oxybutynin (DITROPAN-XL) 5 MG 24 hr tablet Take 1 tablet (5 mg total) by mouth at bedtime. 30 tablet 1  . potassium chloride SA (KLOR-CON) 20 MEQ tablet Take 1 tablet (20 mEq  total) by mouth daily. 14 tablet 0  . predniSONE (DELTASONE) 10 MG tablet 6 tab x 2 days, 5 tab x 2 days, 4 tab x 2 days, 3 tab x 2 days, 2 tab x 2 days, 1 tab x 2 days, stop 42 tablet 0  . prochlorperazine (COMPAZINE) 10 MG tablet Take 1 tablet (10 mg total) by mouth every 6 (six) hours as needed (Nausea or vomiting). 30 tablet 1  . sulfamethoxazole-trimethoprim (BACTRIM DS) 800-160 MG tablet Take 1 tablet by mouth 2 (two) times daily. 14 tablet 0   Current  Facility-Administered Medications  Medication Dose Route Frequency Provider Last Rate Last Admin  . 0.9 %  sodium chloride infusion   Intravenous Continuous Nicholas Lose, MD      . heparin lock flush 100 unit/mL  500 Units Intracatheter Once Nicholas Lose, MD      . sodium chloride flush (NS) 0.9 % injection 10 mL  10 mL Intracatheter Once Nicholas Lose, MD        PHYSICAL EXAMINATION: ECOG PERFORMANCE STATUS: 1 - Symptomatic but completely ambulatory  Vitals:   08/19/20 1407  BP: (!) 114/55  Pulse: (!) 103  Resp: 18  Temp: 98.1 F (36.7 C)  SpO2: 100%   Filed Weights   08/19/20 1407  Weight: 153 lb 9.6 oz (69.7 kg)     LABORATORY DATA:  I have reviewed the data as listed CMP Latest Ref Rng & Units 08/19/2020 08/12/2020 08/06/2020  Glucose 70 - 99 mg/dL 106(H) 106(H) 95  BUN 6 - 20 mg/dL 13 7 <4(L)  Creatinine 0.44 - 1.00 mg/dL 0.82 0.75 0.66  Sodium 135 - 145 mmol/L 138 131(L) 131(L)  Potassium 3.5 - 5.1 mmol/L 4.0 3.6 3.6  Chloride 98 - 111 mmol/L 107 100 99  CO2 22 - 32 mmol/L 21(L) 20(L) 20(L)  Calcium 8.9 - 10.3 mg/dL 8.6(L) 9.0 9.0  Total Protein 6.5 - 8.1 g/dL 6.6 6.8 7.1  Total Bilirubin 0.3 - 1.2 mg/dL 0.3 0.5 0.6  Alkaline Phos 38 - 126 U/L 92 92 113  AST 15 - 41 U/L 33 27 29  ALT 0 - 44 U/L 36 19 23    Lab Results  Component Value Date   WBC 7.2 08/19/2020   HGB 7.7 (L) 08/19/2020   HCT 24.4 (L) 08/19/2020   MCV 96.4 08/19/2020   PLT 391 08/19/2020   NEUTROABS 4.7 08/19/2020    ASSESSMENT & PLAN:  Malignant neoplasm of upper-inner quadrant of right breast in female, estrogen receptor positive ([place]) 01/2020:Palpable right breast mass growing since July 2021. Mammogram revealed 2.2 cm right breast mass with a 0.8 cm satellite lesion. 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  Treatment plan 1. Neoadjuvant chemotherapy with Adriamycin  and Cytoxanpembrolizumab4 followed by Taxol weekly 12 with carboplatin pembrolizumabevery 3 weeks x4, followed by pembrolizumab maintenance to complete 1 year 2. Followed by breast conserving surgery with targeted axillary dissection 3. 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 Plan to obtain breast MRI Surgery follow-up. Port can be removed during surgery because we did not intend to give Keytruda in the adjuvant setting.  Return to clinic after surgery to discuss pathology report.    Orders Placed This Encounter  Procedures  . MR BREAST BILATERAL W WO CONTRAST INC CAD    Standing Status:   Future    Standing Expiration Date:   08/19/2021    Order Specific Question:   If indicated for the ordered procedure, I authorize the administration of contrast media per Radiology protocol    Answer:   Yes    Order Specific Question:   What is the patient's sedation requirement?    Answer:   No Sedation    Order Specific Question:   Does the patient have a pacemaker or implanted devices?    Answer:   No    Order Specific Question:   Preferred imaging location?    Answer:   [facility] (table limit-550lbs)    Order Specific Question:   Release to patient    Answer:   Immediate   The patient has a good understanding of the overall  plan. she agrees with it. she will call with any problems that may develop before the next visit here.  Total time spent: 30 mins including face to face time and time spent for planning, charting and coordination of care  Rulon Eisenmenger, MD, MPH 08/19/2020  I, Cloyde Reams Dorshimer, am acting as scribe for Dr. Nicholas Lose.  I have reviewed the above documentation for accuracy and completeness, and I agree with the above.

## 2020-08-19 ENCOUNTER — Inpatient Hospital Stay: Payer: Medicaid Other

## 2020-08-19 ENCOUNTER — Inpatient Hospital Stay (HOSPITAL_BASED_OUTPATIENT_CLINIC_OR_DEPARTMENT_OTHER): Payer: Medicaid Other | Admitting: Hematology and Oncology

## 2020-08-19 ENCOUNTER — Other Ambulatory Visit: Payer: Self-pay

## 2020-08-19 VITALS — BP 114/55 | HR 103 | Temp 98.1°F | Resp 18 | Ht 67.0 in | Wt 153.6 lb

## 2020-08-19 DIAGNOSIS — Z95828 Presence of other vascular implants and grafts: Secondary | ICD-10-CM

## 2020-08-19 DIAGNOSIS — Z170 Estrogen receptor positive status [ER+]: Secondary | ICD-10-CM

## 2020-08-19 DIAGNOSIS — C50211 Malignant neoplasm of upper-inner quadrant of right female breast: Secondary | ICD-10-CM

## 2020-08-19 LAB — CMP (CANCER CENTER ONLY)
ALT: 36 U/L (ref 0–44)
AST: 33 U/L (ref 15–41)
Albumin: 3.2 g/dL — ABNORMAL LOW (ref 3.5–5.0)
Alkaline Phosphatase: 92 U/L (ref 38–126)
Anion gap: 10 (ref 5–15)
BUN: 13 mg/dL (ref 6–20)
CO2: 21 mmol/L — ABNORMAL LOW (ref 22–32)
Calcium: 8.6 mg/dL — ABNORMAL LOW (ref 8.9–10.3)
Chloride: 107 mmol/L (ref 98–111)
Creatinine: 0.82 mg/dL (ref 0.44–1.00)
GFR, Estimated: >60 mL/min (ref >60)
Glucose, Bld: 106 mg/dL — ABNORMAL HIGH (ref 70–99)
Potassium: 4 mmol/L (ref 3.5–5.1)
Sodium: 138 mmol/L (ref 135–145)
Total Bilirubin: 0.3 mg/dL (ref 0.3–1.2)
Total Protein: 6.6 g/dL (ref 6.5–8.1)

## 2020-08-19 LAB — CBC WITH DIFFERENTIAL (CANCER CENTER ONLY)
Abs Immature Granulocytes: 0.16 10*3/uL — ABNORMAL HIGH (ref 0.00–0.07)
Basophils Absolute: 0 10*3/uL (ref 0.0–0.1)
Basophils Relative: 0 %
Eosinophils Absolute: 0 10*3/uL (ref 0.0–0.5)
Eosinophils Relative: 0 %
HCT: 24.4 % — ABNORMAL LOW (ref 36.0–46.0)
Hemoglobin: 7.7 g/dL — ABNORMAL LOW (ref 12.0–15.0)
Immature Granulocytes: 2 %
Lymphocytes Relative: 10 %
Lymphs Abs: 0.7 10*3/uL (ref 0.7–4.0)
MCH: 30.4 pg (ref 26.0–34.0)
MCHC: 31.6 g/dL (ref 30.0–36.0)
MCV: 96.4 fL (ref 80.0–100.0)
Monocytes Absolute: 1.7 10*3/uL — ABNORMAL HIGH (ref 0.1–1.0)
Monocytes Relative: 23 %
Neutro Abs: 4.7 10*3/uL (ref 1.7–7.7)
Neutrophils Relative %: 65 %
Platelet Count: 391 10*3/uL (ref 150–400)
RBC: 2.53 MIL/uL — ABNORMAL LOW (ref 3.87–5.11)
RDW: 18.5 % — ABNORMAL HIGH (ref 11.5–15.5)
WBC Count: 7.2 10*3/uL (ref 4.0–10.5)
nRBC: 1 /100 WBC — ABNORMAL HIGH

## 2020-08-19 MED ORDER — HEPARIN SOD (PORK) LOCK FLUSH 100 UNIT/ML IV SOLN
500.0000 [IU] | Freq: Once | INTRAVENOUS | Status: DC
  Filled 2020-08-19: qty 5

## 2020-08-19 MED ORDER — SODIUM CHLORIDE 0.9 % IV SOLN
INTRAVENOUS | Status: DC
  Filled 2020-08-19: qty 250

## 2020-08-19 MED ORDER — SODIUM CHLORIDE 0.9% FLUSH
10.0000 mL | Freq: Once | INTRAVENOUS | Status: DC
  Filled 2020-08-19: qty 10

## 2020-08-19 NOTE — Assessment & Plan Note (Addendum)
01/2020:Palpable right breast mass growing since July 2021. Mammogram revealed 2.2 cm right breast mass with a 0.8 cm satellite lesion. 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  Treatment plan 1. Neoadjuvant chemotherapy with Adriamycin and Cytoxanpembrolizumab4 followed by Taxol weekly 12 with carboplatin pembrolizumabevery 3 weeks x4, followed by pembrolizumab maintenance to complete 1 year 2. Followed by breast conserving surgery with targeted axillary dissection 3. 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 Plan to obtain breast MRI Surgery follow-up. Port can be removed during surgery because we did not intend to give Keytruda in the adjuvant setting.  Return to clinic after surgery to discuss pathology report.

## 2020-08-20 ENCOUNTER — Telehealth: Payer: Self-pay | Admitting: Hematology and Oncology

## 2020-08-20 NOTE — Telephone Encounter (Signed)
Per 3/9 los, no changes made to pt schedule

## 2020-08-24 ENCOUNTER — Other Ambulatory Visit: Payer: Self-pay | Admitting: Medical

## 2020-08-24 DIAGNOSIS — G629 Polyneuropathy, unspecified: Secondary | ICD-10-CM

## 2020-08-24 MED ORDER — GABAPENTIN 300 MG PO CAPS: 300.0000 mg | ORAL_CAPSULE | Freq: Every day | ORAL | 3 refills | Status: DC

## 2020-08-26 ENCOUNTER — Other Ambulatory Visit: Payer: Medicaid Other

## 2020-08-26 ENCOUNTER — Ambulatory Visit: Payer: Medicaid Other

## 2020-08-26 ENCOUNTER — Encounter: Payer: Medicaid Other | Admitting: Dietician

## 2020-08-26 ENCOUNTER — Ambulatory Visit: Payer: Medicaid Other | Admitting: Hematology and Oncology

## 2020-08-27 ENCOUNTER — Encounter: Payer: Self-pay | Admitting: *Deleted

## 2020-09-02 ENCOUNTER — Other Ambulatory Visit: Payer: Medicaid Other

## 2020-09-02 ENCOUNTER — Ambulatory Visit
Admission: RE | Admit: 2020-09-02 | Discharge: 2020-09-02 | Disposition: A | Discharge status: Home / Self Care | Payer: Medicaid Other | Source: Ambulatory Visit | Attending: Hematology and Oncology | Admitting: Hematology and Oncology

## 2020-09-02 ENCOUNTER — Ambulatory Visit: Payer: Medicaid Other

## 2020-09-02 DIAGNOSIS — Z170 Estrogen receptor positive status [ER+]: Secondary | ICD-10-CM

## 2020-09-02 DIAGNOSIS — C50211 Malignant neoplasm of upper-inner quadrant of right female breast: Secondary | ICD-10-CM

## 2020-09-02 MED ORDER — GADOBUTROL 1 MMOL/ML IV SOLN
6.0000 mL | Freq: Once | INTRAVENOUS | Status: AC | PRN
  Administered 2020-09-02: 6 mL via INTRAVENOUS

## 2020-09-03 ENCOUNTER — Other Ambulatory Visit: Payer: Self-pay | Admitting: *Deleted

## 2020-09-03 ENCOUNTER — Encounter: Payer: Self-pay | Admitting: *Deleted

## 2020-09-03 DIAGNOSIS — C50211 Malignant neoplasm of upper-inner quadrant of right female breast: Secondary | ICD-10-CM

## 2020-09-04 ENCOUNTER — Encounter: Payer: Self-pay | Admitting: *Deleted

## 2020-09-04 ENCOUNTER — Other Ambulatory Visit: Payer: Self-pay | Admitting: Hematology and Oncology

## 2020-09-04 ENCOUNTER — Telehealth: Payer: Self-pay | Admitting: *Deleted

## 2020-09-04 DIAGNOSIS — R9389 Abnormal findings on diagnostic imaging of other specified body structures: Secondary | ICD-10-CM

## 2020-09-04 DIAGNOSIS — C50211 Malignant neoplasm of upper-inner quadrant of right female breast: Secondary | ICD-10-CM

## 2020-09-04 DIAGNOSIS — Z170 Estrogen receptor positive status [ER+]: Secondary | ICD-10-CM

## 2020-09-04 NOTE — Telephone Encounter (Signed)
Called pt regarding MRI results and need for further MRI bx to determine new findings on MRI. Received verbal understanding. Pt wishes to r/s appt with Dr. Marlou Starks until after bx to determine final tx plan. Msg sent to Dr. Marlou Starks and nurse requesting new appt. No further needs voiced at this time.

## 2020-09-10 ENCOUNTER — Ambulatory Visit
Admission: RE | Admit: 2020-09-10 | Discharge: 2020-09-10 | Disposition: A | Discharge status: Home / Self Care | Payer: Medicaid Other | Source: Ambulatory Visit | Attending: Hematology and Oncology | Admitting: Hematology and Oncology

## 2020-09-10 ENCOUNTER — Encounter: Payer: Self-pay | Admitting: *Deleted

## 2020-09-10 ENCOUNTER — Ambulatory Visit: Payer: Medicaid Other

## 2020-09-10 ENCOUNTER — Other Ambulatory Visit: Payer: Self-pay

## 2020-09-10 DIAGNOSIS — R9389 Abnormal findings on diagnostic imaging of other specified body structures: Secondary | ICD-10-CM

## 2020-09-11 ENCOUNTER — Encounter: Payer: Self-pay | Admitting: *Deleted

## 2020-09-15 ENCOUNTER — Telehealth: Payer: Self-pay | Admitting: *Deleted

## 2020-09-15 NOTE — Telephone Encounter (Signed)
Received call from pt with complaint of constipation and increase in fulness with meals.  Pt states she has a bowel movement every 4-5 days.  Pt also reports feeling overly full after a few bites of food.  Per MD pt may be experiencing GERD.  Per MD pt to take OTC Pepcid 20 mg p.o daily as well as Miralax daily to alleviate prolonged constipation. Pt also educated on increasing p.o fluid intake to help prevent constipation.  Pt requesting f/u with MD in 1 week and will cancel apt if symptoms resolve.  Apt scheduled and pt verbalized understanding of date and time.

## 2020-09-16 ENCOUNTER — Other Ambulatory Visit: Payer: Self-pay | Admitting: Medical

## 2020-09-16 MED ORDER — FLUCONAZOLE 50 MG PO TABS
100.0000 mg | ORAL_TABLET | Freq: Every day | ORAL | 0 refills | Status: AC
Start: 2020-09-16 — End: 2020-09-23

## 2020-09-21 ENCOUNTER — Ambulatory Visit: Admission: RE | Admit: 2020-09-21 | Payer: Medicaid Other | Source: Ambulatory Visit

## 2020-09-21 ENCOUNTER — Other Ambulatory Visit: Payer: Self-pay

## 2020-09-21 ENCOUNTER — Ambulatory Visit
Admission: RE | Admit: 2020-09-21 | Discharge: 2020-09-21 | Disposition: A | Discharge status: Home / Self Care | Payer: Medicaid Other | Source: Ambulatory Visit | Attending: Hematology and Oncology | Admitting: Hematology and Oncology

## 2020-09-21 ENCOUNTER — Ambulatory Visit: Payer: Medicaid Other

## 2020-09-21 MED ORDER — GADOBUTROL 1 MMOL/ML IV SOLN: 9.0000 mL | Freq: Once | INTRAVENOUS | Status: DC | PRN

## 2020-09-22 ENCOUNTER — Encounter: Payer: Self-pay | Admitting: *Deleted

## 2020-09-22 ENCOUNTER — Telehealth: Payer: Self-pay | Admitting: *Deleted

## 2020-09-22 NOTE — Telephone Encounter (Signed)
Received call from pt requesting to cancel upcoming apt.  Pt states nausea and constipation symptoms have resolved and does not need to be seen by MD at this time. apts canceled and pt educated to call the office if symptoms resume.

## 2020-09-23 ENCOUNTER — Ambulatory Visit: Payer: Medicaid Other | Admitting: Hematology and Oncology

## 2020-09-30 ENCOUNTER — Ambulatory Visit: Payer: Self-pay | Admitting: General Surgery

## 2020-09-30 DIAGNOSIS — Z170 Estrogen receptor positive status [ER+]: Secondary | ICD-10-CM

## 2020-10-12 NOTE — Assessment & Plan Note (Signed)
01/2020:Palpable right breast mass growing since July 2021. Mammogram revealed 2.2 cm right breast mass with a 0.8 cm satellite lesion. 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  Treatment plan 1. Neoadjuvant chemotherapy with Adriamycin and Cytoxanpembrolizumab4 followed by Taxol weekly 12 with carboplatin pembrolizumabevery 3 weeks x4, followed by pembrolizumab maintenance to complete 1 year 2. Followed by breast conserving surgery with targeted axillary dissection 3. 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

## 2020-10-12 NOTE — Progress Notes (Signed)
Patient Care Team: Muse, Noel Journey., PA-C as PCP - General Brien Mates, RN as Oncology Nurse Navigator (Oncology) Mauro Kaufmann, RN as Oncology Nurse Navigator Rockwell Germany, RN as Oncology Nurse Navigator  DIAGNOSIS:    ICD-10-CM   1. Malignant neoplasm of upper-inner quadrant of right breast in female, estrogen receptor positive ([place])  C50.211 0.9 %  sodium chloride infusion   Z17.0 ondansetron (ZOFRAN) injection 8 mg    DISCONTINUED: ondansetron (ZOFRAN) 8 mg in sodium chloride 0.9 % 50 mL IVPB    SUMMARY OF ONCOLOGIC HISTORY: Oncology History  Malignant neoplasm of upper-inner quadrant of right breast in female, estrogen receptor positive ([place])  01/2020 Initial Diagnosis   Palpable right breast mass growing since July 2021.  Mammogram revealed 2.2 cm right breast mass with a 0.8 cm satellite lesion.  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   03/31/2020 Cancer Staging   Staging form: Breast, AJCC 8th Edition - Clinical stage from 03/31/2020: Stage IIIB (cT2, cN1(f), cM0, G3, ER-, PR-, HER2-) - Signed by Nicholas Lose, MD on 04/02/2020   04/09/2020 -  Chemotherapy    Patient is on Treatment Plan: BREAST AC Q21D / CARBOPLATIN D1 + PACLITAXEL D1,8,15 Q21D        CHIEF COMPLIANT: Follow-up of breast cancer  INTERVAL HISTORY: Sharon Fischer is a 55 y.o. with above-mentioned history of breast cancer who completed neoadjuvant chemotherapy. She presents to the clinic todayfor follow-up.  She came into our clinic today in a wheelchair complaining of profound fatigue and generalized weakness.  Her family informed us that she has not been eating well and has mostly been sleeping or resting at home.  She has been drinking plenty of water but has not been eating much food.  She has appetite but says that she feels full and does not like to eat.  ALLERGIES:   has No Known Allergies.  MEDICATIONS:  Current Outpatient Medications  Medication Sig Dispense Refill  . albuterol (VENTOLIN HFA) 108 (90 Base) MCG/ACT inhaler Inhale 1-2 puffs into the lungs every 4 (four) hours as needed for wheezing or shortness of breath. 8 g 2  . atorvastatin (LIPITOR) 40 MG tablet Take by mouth.    . fluticasone (FLONASE) 50 MCG/ACT nasal spray Place into both nostrils.    Marland Kitchen gabapentin (NEURONTIN) 300 MG capsule Take 1 capsule (300 mg total) by mouth at bedtime. 30 capsule 3  . guaiFENesin-codeine 100-10 MG/5ML syrup Take 5 mLs by mouth every 4 (four) hours as needed for cough. 180 mL 0  . lidocaine-prilocaine (EMLA) cream Apply to affected area once 30 g 3  . lisinopril-hydrochlorothiazide (ZESTORETIC) 20-12.5 MG tablet Take 1 tablet by mouth daily.    Marland Kitchen LORazepam (ATIVAN) 0.5 MG tablet Take 1 tablet (0.5 mg total) by mouth at bedtime as needed for sleep. 30 tablet 0  . ondansetron (ZOFRAN) 8 MG tablet Take 1 tablet (8 mg total) by mouth 2 (two) times daily as needed. Start on the third day after chemotherapy. 30 tablet 1  . oxybutynin (DITROPAN-XL) 5 MG 24 hr tablet Take 1 tablet (5 mg total) by mouth at bedtime. 30 tablet 1  . potassium chloride SA (KLOR-CON) 20 MEQ tablet Take 1 tablet (20 mEq total) by mouth daily. 14 tablet 0  . predniSONE (DELTASONE) 10 MG tablet 6 tab x 2 days, 5 tab x  2 days, 4 tab x 2 days, 3 tab x 2 days, 2 tab x 2 days, 1 tab x 2 days, stop 42 tablet 0  . prochlorperazine (COMPAZINE) 10 MG tablet Take 1 tablet (10 mg total) by mouth every 6 (six) hours as needed (Nausea or vomiting). 30 tablet 1  . sulfamethoxazole-trimethoprim (BACTRIM DS) 800-160 MG tablet Take 1 tablet by mouth 2 (two) times daily. 14 tablet 0   Current Facility-Administered Medications  Medication Dose Route Frequency Provider Last Rate Last Admin  . 0.9 %  sodium chloride infusion   Intravenous Continuous Nicholas Lose, MD 500 mL/hr at 10/13/20 1243 New Bag at 10/13/20  1243    PHYSICAL EXAMINATION: ECOG PERFORMANCE STATUS: 3 - Symptomatic, >50% confined to bed  Vitals:   10/13/20 1209  BP: (!) 69/39  Pulse: (!) 140  Resp: 18  Temp: 97.9 F (36.6 C)  SpO2: 100%   There were no vitals filed for this visit.     LABORATORY DATA:  I have reviewed the data as listed CMP Latest Ref Rng & Units 10/13/2020 08/19/2020 08/12/2020  Glucose 70 - 99 mg/dL 145(H) 106(H) 106(H)  BUN 6 - 20 mg/dL '13 13 7  ' Creatinine 0.44 - 1.00 mg/dL 1.77(H) 0.82 0.75  Sodium 135 - 145 mmol/L 141 138 131(L)  Potassium 3.5 - 5.1 mmol/L 3.2(L) 4.0 3.6  Chloride 98 - 111 mmol/L 107 107 100  CO2 22 - 32 mmol/L 14(L) 21(L) 20(L)  Calcium 8.9 - 10.3 mg/dL 12.1(H) 8.6(L) 9.0  Total Protein 6.5 - 8.1 g/dL 6.6 6.6 6.8  Total Bilirubin 0.3 - 1.2 mg/dL 0.5 0.3 0.5  Alkaline Phos 38 - 126 U/L 169(H) 92 92  AST 15 - 41 U/L 32 33 27  ALT 0 - 44 U/L 10 36 19    Lab Results  Component Value Date   WBC 4.7 10/13/2020   HGB 11.8 (L) 10/13/2020   HCT 35.9 (L) 10/13/2020   MCV 88.9 10/13/2020   PLT 172 10/13/2020   NEUTROABS 1.5 (L) 10/13/2020    ASSESSMENT & PLAN:  Malignant neoplasm of upper-inner quadrant of right breast in female, estrogen receptor positive ([place]) 01/2020:Palpable right breast mass growing since July 2021. Mammogram revealed 2.2 cm right breast mass with a 0.8 cm satellite lesion. 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  Treatment plan 1. Neoadjuvant chemotherapy with Adriamycin and Cytoxanpembrolizumab4 followed by Taxol weekly 12 with carboplatin pembrolizumabevery 3 weeks x4, followed by pembrolizumab maintenance to complete 1 year 2. Followed by breast conserving surgery with targeted axillary dissection 3. 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 Upon evaluation her blood pressure today was 60/30.  She is awake and alert but apparently. Urgently we started her on IV fluids.  By the end of the IV fluids her blood pressure has improved to 120 x 108  Decreased appetite: Starting dexamethasone 1  mg daily. I informed her that if she does not get better then surgery would not be possible.  She is currently being planned to undergo mastectomy.  Nutrition counseling: We discussed with her that she needs to eat more protein and get stronger if she were to do well with surgery.  Provided her with boost supplement.  She will come daily for IV fluids and will be reevaluated by Mendel Ryder on Friday to determine the appropriate course of treatment.  She will need labs to be repeated on Friday.     No orders of the defined types were placed in this encounter.  The patient has a good understanding of the overall plan. she agrees with it. she will call with any problems that may develop before the next visit here.  Total time spent: 45 mins including face to face time and time spent for planning, charting and coordination of care  Rulon Eisenmenger, MD, MPH 10/13/2020  I, Cloyde Reams Dorshimer, am acting as scribe for  Dr. Nicholas Lose.  I have reviewed the above documentation for accuracy and completeness, and I agree with the above.

## 2020-10-13 ENCOUNTER — Other Ambulatory Visit: Payer: Self-pay

## 2020-10-13 ENCOUNTER — Inpatient Hospital Stay: Payer: Medicaid Other

## 2020-10-13 ENCOUNTER — Inpatient Hospital Stay: Payer: Medicaid Other | Attending: Medical | Admitting: Hematology and Oncology

## 2020-10-13 ENCOUNTER — Encounter: Payer: Self-pay | Admitting: *Deleted

## 2020-10-13 VITALS — BP 122/106 | HR 95

## 2020-10-13 DIAGNOSIS — Z171 Estrogen receptor negative status [ER-]: Secondary | ICD-10-CM | POA: Insufficient documentation

## 2020-10-13 DIAGNOSIS — Z170 Estrogen receptor positive status [ER+]: Secondary | ICD-10-CM

## 2020-10-13 DIAGNOSIS — J189 Pneumonia, unspecified organism: Secondary | ICD-10-CM | POA: Diagnosis not present

## 2020-10-13 DIAGNOSIS — N179 Acute kidney failure, unspecified: Secondary | ICD-10-CM | POA: Diagnosis not present

## 2020-10-13 DIAGNOSIS — E860 Dehydration: Secondary | ICD-10-CM | POA: Insufficient documentation

## 2020-10-13 DIAGNOSIS — F17210 Nicotine dependence, cigarettes, uncomplicated: Secondary | ICD-10-CM | POA: Insufficient documentation

## 2020-10-13 DIAGNOSIS — C50211 Malignant neoplasm of upper-inner quadrant of right female breast: Secondary | ICD-10-CM

## 2020-10-13 DIAGNOSIS — Z95828 Presence of other vascular implants and grafts: Secondary | ICD-10-CM

## 2020-10-13 LAB — CMP (CANCER CENTER ONLY)
ALT: 10 U/L (ref 0–44)
AST: 32 U/L (ref 15–41)
Albumin: 3.3 g/dL — ABNORMAL LOW (ref 3.5–5.0)
Alkaline Phosphatase: 169 U/L — ABNORMAL HIGH (ref 38–126)
Anion gap: 20 — ABNORMAL HIGH (ref 5–15)
BUN: 13 mg/dL (ref 6–20)
CO2: 14 mmol/L — ABNORMAL LOW (ref 22–32)
Calcium: 12.1 mg/dL — ABNORMAL HIGH (ref 8.9–10.3)
Chloride: 107 mmol/L (ref 98–111)
Creatinine: 1.77 mg/dL — ABNORMAL HIGH (ref 0.44–1.00)
GFR, Estimated: 33 mL/min — ABNORMAL LOW (ref >60)
Glucose, Bld: 145 mg/dL — ABNORMAL HIGH (ref 70–99)
Potassium: 3.2 mmol/L — ABNORMAL LOW (ref 3.5–5.1)
Sodium: 141 mmol/L (ref 135–145)
Total Bilirubin: 0.5 mg/dL (ref 0.3–1.2)
Total Protein: 6.6 g/dL (ref 6.5–8.1)

## 2020-10-13 LAB — CBC WITH DIFFERENTIAL (CANCER CENTER ONLY)
Abs Immature Granulocytes: 0 10*3/uL (ref 0.00–0.07)
Basophils Absolute: 0.1 10*3/uL (ref 0.0–0.1)
Basophils Relative: 2 %
Eosinophils Absolute: 1 10*3/uL — ABNORMAL HIGH (ref 0.0–0.5)
Eosinophils Relative: 21 %
HCT: 35.9 % — ABNORMAL LOW (ref 36.0–46.0)
Hemoglobin: 11.8 g/dL — ABNORMAL LOW (ref 12.0–15.0)
Immature Granulocytes: 0 %
Lymphocytes Relative: 35 %
Lymphs Abs: 1.6 10*3/uL (ref 0.7–4.0)
MCH: 29.2 pg (ref 26.0–34.0)
MCHC: 32.9 g/dL (ref 30.0–36.0)
MCV: 88.9 fL (ref 80.0–100.0)
Monocytes Absolute: 0.5 10*3/uL (ref 0.1–1.0)
Monocytes Relative: 10 %
Neutro Abs: 1.5 10*3/uL — ABNORMAL LOW (ref 1.7–7.7)
Neutrophils Relative %: 32 %
Platelet Count: 172 10*3/uL (ref 150–400)
RBC: 4.04 MIL/uL (ref 3.87–5.11)
RDW: 15.5 % (ref 11.5–15.5)
WBC Count: 4.7 10*3/uL (ref 4.0–10.5)
nRBC: 0 % (ref 0.0–0.2)

## 2020-10-13 MED ORDER — DEXAMETHASONE 1 MG PO TABS: 1.0000 mg | ORAL_TABLET | Freq: Every day | ORAL | 2 refills | Status: DC

## 2020-10-13 MED ORDER — PROCHLORPERAZINE MALEATE 10 MG PO TABS: 10.0000 mg | ORAL_TABLET | Freq: Four times a day (QID) | ORAL | 1 refills | Status: DC | PRN

## 2020-10-13 MED ORDER — ONDANSETRON HCL 4 MG/2ML IJ SOLN
8.0000 mg | Freq: Once | INTRAMUSCULAR | Status: AC
  Administered 2020-10-13: 8 mg via INTRAMUSCULAR

## 2020-10-13 MED ORDER — SODIUM CHLORIDE 0.9 % IV SOLN: 8.0000 mg | Freq: Once | INTRAVENOUS | Status: DC

## 2020-10-13 MED ORDER — ONDANSETRON HCL 4 MG/2ML IJ SOLN
INTRAMUSCULAR | Status: AC
  Filled 2020-10-13: qty 4

## 2020-10-13 MED ORDER — SODIUM CHLORIDE 0.9 % IV SOLN
INTRAVENOUS | Status: AC
  Filled 2020-10-13 (×2): qty 250

## 2020-10-13 MED ORDER — HEPARIN SOD (PORK) LOCK FLUSH 100 UNIT/ML IV SOLN
500.0000 [IU] | Freq: Once | INTRAVENOUS | Status: AC
Start: 2020-10-13 — End: 2020-10-13
  Administered 2020-10-13: 500 [IU]
  Filled 2020-10-13: qty 5

## 2020-10-13 MED ORDER — SODIUM CHLORIDE 0.9% FLUSH
10.0000 mL | Freq: Once | INTRAVENOUS | Status: AC
  Administered 2020-10-13: 10 mL
  Filled 2020-10-13: qty 10

## 2020-10-13 MED ORDER — ONDANSETRON HCL 8 MG PO TABS: 8.0000 mg | ORAL_TABLET | Freq: Three times a day (TID) | ORAL | 1 refills | Status: DC | PRN

## 2020-10-13 NOTE — Progress Notes (Signed)
Per Dr. Lindi Adie, [place] to discharge patient with current BP

## 2020-10-13 NOTE — Patient Instructions (Signed)
Dehydration, Adult Dehydration is condition in which there is not enough water or other fluids in the body. This happens when a person loses more fluids than he or she takes in. Important body parts cannot work right without the right amount of fluids. Any loss of fluids from the body can cause dehydration. Dehydration can be mild, worse, or very bad. It should be treated right away to keep it from getting very bad. What are the causes? This condition may be caused by:  Conditions that cause loss of water or other fluids, such as: ? Watery poop (diarrhea). ? Vomiting. ? Sweating a lot. ? Peeing (urinating) a lot.  Not drinking enough fluids, especially when you: ? Are ill. ? Are doing things that take a lot of energy to do.  Other illnesses and conditions, such as fever or infection.  Certain medicines, such as medicines that take extra fluid out of the body (diuretics).  Lack of safe drinking water.  Not being able to get enough water and food. What increases the risk? The following factors may make you more likely to develop this condition:  Having a long-term (chronic) illness that has not been treated the right way, such as: ? Diabetes. ? Heart disease. ? Kidney disease.  Being 65 years of age or older.  Having a disability.  Living in a place that is high above the ground or sea (high in altitude). The thinner, dried air causes more fluid loss.  Doing exercises that put stress on your body for a long time. What are the signs or symptoms? Symptoms of dehydration depend on how bad it is. Mild or worse dehydration  Thirst.  Dry lips or dry mouth.  Feeling dizzy or light-headed, especially when you stand up from sitting.  Muscle cramps.  Your body making: ? Dark pee (urine). Pee may be the color of tea. ? Less pee than normal. ? Less tears than normal.  Headache. Very bad dehydration  Changes in skin. Skin may: ? Be cold to the touch (clammy). ? Be blotchy  or pale. ? Not go back to normal right after you lightly pinch it and let it go.  Little or no tears, pee, or sweat.  Changes in vital signs, such as: ? Fast breathing. ? Low blood pressure. ? Weak pulse. ? Pulse that is more than 100 beats a minute when you are sitting still.  Other changes, such as: ? Feeling very thirsty. ? Eyes that look hollow (sunken). ? Cold hands and feet. ? Being mixed up (confused). ? Being very tired (lethargic) or having trouble waking from sleep. ? Short-term weight loss. ? Loss of consciousness. How is this treated? Treatment for this condition depends on how bad it is. Treatment should start right away. Do not wait until your condition gets very bad. Very bad dehydration is an emergency. You will need to go to a hospital.  Mild or worse dehydration can be treated at home. You may be asked to: ? Drink more fluids. ? Drink an oral rehydration solution (ORS). This drink helps get the right amounts of fluids and salts and minerals in the blood (electrolytes).  Very bad dehydration can be treated: ? With fluids through an IV tube. ? By getting normal levels of salts and minerals in your blood. This is often done by giving salts and minerals through a tube. The tube is passed through your nose and into your stomach. ? By treating the root cause. Follow these instructions at   home: Oral rehydration solution If told by your doctor, drink an ORS:  Make an ORS. Use instructions on the package.  Start by drinking small amounts, about  cup (120 mL) every 5-10 minutes.  Slowly drink more until you have had the amount that your doctor said to have. Eating and drinking  Drink enough clear fluid to keep your pee pale yellow. If you were told to drink an ORS, finish the ORS first. Then, start slowly drinking other clear fluids. Drink fluids such as: ? Water. Do not drink only water. Doing that can make the salt (sodium) level in your body get too low. ? Water  from ice chips you suck on. ? Fruit juice that you have added water to (diluted). ? Low-calorie sports drinks.  Eat foods that have the right amounts of salts and minerals, such as: ? Bananas. ? Oranges. ? Potatoes. ? Tomatoes. ? Spinach.  Do not drink alcohol.  Avoid: ? Drinks that have a lot of sugar. These include:  High-calorie sports drinks.  Fruit juice that you did not add water to.  Soda.  Caffeine. ? Foods that are greasy or have a lot of fat or sugar.         General instructions  Take over-the-counter and prescription medicines only as told by your doctor.  Do not take salt tablets. Doing that can make the salt level in your body get too high.  Return to your normal activities as told by your doctor. Ask your doctor what activities are safe for you.  Keep all follow-up visits as told by your doctor. This is important. Contact a doctor if:  You have pain in your belly (abdomen) and the pain: ? Gets worse. ? Stays in one place.  You have a rash.  You have a stiff neck.  You get angry or annoyed (irritable) more easily than normal.  You are more tired or have a harder time waking than normal.  You feel: ? Weak or dizzy. ? Very thirsty. Get help right away if you have:  Any symptoms of very bad dehydration.  Symptoms of vomiting, such as: ? You cannot eat or drink without vomiting. ? Your vomiting gets worse or does not go away. ? Your vomit has blood or green stuff in it.  Symptoms that get worse with treatment.  A fever.  A very bad headache.  Problems with peeing or pooping (having a bowel movement), such as: ? Watery poop that gets worse or does not go away. ? Blood in your poop (stool). This may cause poop to look black and tarry. ? Not peeing in 6-8 hours. ? Peeing only a small amount of very dark pee in 6-8 hours.  Trouble breathing. These symptoms may be an emergency. Do not wait to see if the symptoms will go away. Get  medical help right away. Call your local emergency services (911 in the [place] Do not drive yourself to the hospital. Summary  Dehydration is a condition in which there is not enough water or other fluids in the body. This happens when a person loses more fluids than he or she takes in.  Treatment for this condition depends on how bad it is. Treatment should be started right away. Do not wait until your condition gets very bad.  Drink enough clear fluid to keep your pee pale yellow. If you were told to drink an oral rehydration solution (ORS), finish the ORS first. Then, start slowly drinking other clear fluids.    Take over-the-counter and prescription medicines only as told by your doctor.  Get help right away if you have any symptoms of very bad dehydration. This information is not intended to replace advice given to you by your health care provider. Make sure you discuss any questions you have with your health care provider. Document Revised: 01/10/2019 Document Reviewed: 01/10/2019 Elsevier Patient Education  2021 [employer] Inc.  

## 2020-10-14 ENCOUNTER — Inpatient Hospital Stay: Payer: Medicaid Other

## 2020-10-14 DIAGNOSIS — Z170 Estrogen receptor positive status [ER+]: Secondary | ICD-10-CM

## 2020-10-14 DIAGNOSIS — Z95828 Presence of other vascular implants and grafts: Secondary | ICD-10-CM

## 2020-10-14 DIAGNOSIS — C50211 Malignant neoplasm of upper-inner quadrant of right female breast: Secondary | ICD-10-CM

## 2020-10-14 MED ORDER — SODIUM CHLORIDE 0.9 % IV SOLN
INTRAVENOUS | Status: DC
  Filled 2020-10-14 (×2): qty 250

## 2020-10-14 MED ORDER — SODIUM CHLORIDE 0.9% FLUSH
10.0000 mL | Freq: Once | INTRAVENOUS | Status: DC
  Filled 2020-10-14: qty 10

## 2020-10-14 MED ORDER — HEPARIN SOD (PORK) LOCK FLUSH 100 UNIT/ML IV SOLN
500.0000 [IU] | Freq: Once | INTRAVENOUS | Status: AC
  Administered 2020-10-14: 500 [IU]
  Filled 2020-10-14: qty 5

## 2020-10-14 NOTE — Patient Instructions (Signed)
Dehydration, Adult Dehydration is condition in which there is not enough water or other fluids in the body. This happens when a person loses more fluids than he or she takes in. Important body parts cannot work right without the right amount of fluids. Any loss of fluids from the body can cause dehydration. Dehydration can be mild, worse, or very bad. It should be treated right away to keep it from getting very bad. What are the causes? This condition may be caused by:  Conditions that cause loss of water or other fluids, such as: ? Watery poop (diarrhea). ? Vomiting. ? Sweating a lot. ? Peeing (urinating) a lot.  Not drinking enough fluids, especially when you: ? Are ill. ? Are doing things that take a lot of energy to do.  Other illnesses and conditions, such as fever or infection.  Certain medicines, such as medicines that take extra fluid out of the body (diuretics).  Lack of safe drinking water.  Not being able to get enough water and food. What increases the risk? The following factors may make you more likely to develop this condition:  Having a long-term (chronic) illness that has not been treated the right way, such as: ? Diabetes. ? Heart disease. ? Kidney disease.  Being 65 years of age or older.  Having a disability.  Living in a place that is high above the ground or sea (high in altitude). The thinner, dried air causes more fluid loss.  Doing exercises that put stress on your body for a long time. What are the signs or symptoms? Symptoms of dehydration depend on how bad it is. Mild or worse dehydration  Thirst.  Dry lips or dry mouth.  Feeling dizzy or light-headed, especially when you stand up from sitting.  Muscle cramps.  Your body making: ? Dark pee (urine). Pee may be the color of tea. ? Less pee than normal. ? Less tears than normal.  Headache. Very bad dehydration  Changes in skin. Skin may: ? Be cold to the touch (clammy). ? Be blotchy  or pale. ? Not go back to normal right after you lightly pinch it and let it go.  Little or no tears, pee, or sweat.  Changes in vital signs, such as: ? Fast breathing. ? Low blood pressure. ? Weak pulse. ? Pulse that is more than 100 beats a minute when you are sitting still.  Other changes, such as: ? Feeling very thirsty. ? Eyes that look hollow (sunken). ? Cold hands and feet. ? Being mixed up (confused). ? Being very tired (lethargic) or having trouble waking from sleep. ? Short-term weight loss. ? Loss of consciousness. How is this treated? Treatment for this condition depends on how bad it is. Treatment should start right away. Do not wait until your condition gets very bad. Very bad dehydration is an emergency. You will need to go to a hospital.  Mild or worse dehydration can be treated at home. You may be asked to: ? Drink more fluids. ? Drink an oral rehydration solution (ORS). This drink helps get the right amounts of fluids and salts and minerals in the blood (electrolytes).  Very bad dehydration can be treated: ? With fluids through an IV tube. ? By getting normal levels of salts and minerals in your blood. This is often done by giving salts and minerals through a tube. The tube is passed through your nose and into your stomach. ? By treating the root cause. Follow these instructions at   home: Oral rehydration solution If told by your doctor, drink an ORS:  Make an ORS. Use instructions on the package.  Start by drinking small amounts, about  cup (120 mL) every 5-10 minutes.  Slowly drink more until you have had the amount that your doctor said to have. Eating and drinking  Drink enough clear fluid to keep your pee pale yellow. If you were told to drink an ORS, finish the ORS first. Then, start slowly drinking other clear fluids. Drink fluids such as: ? Water. Do not drink only water. Doing that can make the salt (sodium) level in your body get too low. ? Water  from ice chips you suck on. ? Fruit juice that you have added water to (diluted). ? Low-calorie sports drinks.  Eat foods that have the right amounts of salts and minerals, such as: ? Bananas. ? Oranges. ? Potatoes. ? Tomatoes. ? Spinach.  Do not drink alcohol.  Avoid: ? Drinks that have a lot of sugar. These include:  High-calorie sports drinks.  Fruit juice that you did not add water to.  Soda.  Caffeine. ? Foods that are greasy or have a lot of fat or sugar.         General instructions  Take over-the-counter and prescription medicines only as told by your doctor.  Do not take salt tablets. Doing that can make the salt level in your body get too high.  Return to your normal activities as told by your doctor. Ask your doctor what activities are safe for you.  Keep all follow-up visits as told by your doctor. This is important. Contact a doctor if:  You have pain in your belly (abdomen) and the pain: ? Gets worse. ? Stays in one place.  You have a rash.  You have a stiff neck.  You get angry or annoyed (irritable) more easily than normal.  You are more tired or have a harder time waking than normal.  You feel: ? Weak or dizzy. ? Very thirsty. Get help right away if you have:  Any symptoms of very bad dehydration.  Symptoms of vomiting, such as: ? You cannot eat or drink without vomiting. ? Your vomiting gets worse or does not go away. ? Your vomit has blood or green stuff in it.  Symptoms that get worse with treatment.  A fever.  A very bad headache.  Problems with peeing or pooping (having a bowel movement), such as: ? Watery poop that gets worse or does not go away. ? Blood in your poop (stool). This may cause poop to look black and tarry. ? Not peeing in 6-8 hours. ? Peeing only a small amount of very dark pee in 6-8 hours.  Trouble breathing. These symptoms may be an emergency. Do not wait to see if the symptoms will go away. Get  medical help right away. Call your local emergency services (911 in the [place] Do not drive yourself to the hospital. Summary  Dehydration is a condition in which there is not enough water or other fluids in the body. This happens when a person loses more fluids than he or she takes in.  Treatment for this condition depends on how bad it is. Treatment should be started right away. Do not wait until your condition gets very bad.  Drink enough clear fluid to keep your pee pale yellow. If you were told to drink an oral rehydration solution (ORS), finish the ORS first. Then, start slowly drinking other clear fluids.    Take over-the-counter and prescription medicines only as told by your doctor.  Get help right away if you have any symptoms of very bad dehydration. This information is not intended to replace advice given to you by your health care provider. Make sure you discuss any questions you have with your health care provider. Document Revised: 01/10/2019 Document Reviewed: 01/10/2019 Elsevier Patient Education  2021 [employer] Inc.  

## 2020-10-15 ENCOUNTER — Inpatient Hospital Stay: Payer: Medicaid Other

## 2020-10-15 ENCOUNTER — Other Ambulatory Visit: Payer: Self-pay

## 2020-10-15 DIAGNOSIS — D649 Anemia, unspecified: Secondary | ICD-10-CM

## 2020-10-15 DIAGNOSIS — Z170 Estrogen receptor positive status [ER+]: Secondary | ICD-10-CM

## 2020-10-15 DIAGNOSIS — C50211 Malignant neoplasm of upper-inner quadrant of right female breast: Secondary | ICD-10-CM | POA: Diagnosis not present

## 2020-10-15 MED ORDER — HEPARIN SOD (PORK) LOCK FLUSH 100 UNIT/ML IV SOLN
500.0000 [IU] | Freq: Every day | INTRAVENOUS | Status: AC | PRN
  Administered 2020-10-15: 500 [IU]
  Filled 2020-10-15: qty 5

## 2020-10-15 MED ORDER — SODIUM CHLORIDE 0.9 % IV SOLN
INTRAVENOUS | Status: DC
  Filled 2020-10-15 (×2): qty 250

## 2020-10-15 MED ORDER — SODIUM CHLORIDE 0.9% FLUSH
10.0000 mL | INTRAVENOUS | Status: AC | PRN
Start: 2020-10-15 — End: 2020-10-15
  Administered 2020-10-15: 10 mL
  Filled 2020-10-15: qty 10

## 2020-10-15 MED ORDER — SODIUM CHLORIDE 0.9% IV SOLUTION
250.0000 mL | Freq: Once | INTRAVENOUS | Status: AC
Start: 2020-10-15 — End: 2020-10-15
  Administered 2020-10-15: 250 mL via INTRAVENOUS
  Filled 2020-10-15: qty 250

## 2020-10-15 NOTE — Patient Instructions (Signed)
Rehydration, Adult Rehydration is the replacement of body fluids, salts, and minerals (electrolytes) that are lost during dehydration. Dehydration is when there is not enough water or other fluids in the body. This happens when you lose more fluids than you take in. Common causes of dehydration include:  Not drinking enough fluids. This can occur when you are ill or doing activities that require a lot of energy, especially in hot weather.  Conditions that cause loss of water or other fluids, such as diarrhea, vomiting, sweating, or urinating a lot.  Other illnesses, such as fever or infection.  Certain medicines, such as those that remove excess fluid from the body (diuretics). Symptoms of mild or moderate dehydration may include thirst, dry lips and mouth, and dizziness. Symptoms of severe dehydration may include increased heart rate, confusion, fainting, and not urinating. For severe dehydration, you may need to get fluids through an IV at the hospital. For mild or moderate dehydration, you can usually rehydrate at home by drinking certain fluids as told by your health care provider. What are the risks? Generally, rehydration is safe. However, taking in too much fluid (overhydration) can be a problem. This is rare. Overhydration can cause an electrolyte imbalance, kidney failure, or a decrease in salt (sodium) levels in the body. Supplies needed You will need an oral rehydration solution (ORS) if your health care provider tells you to use one. This is a drink to treat dehydration. It can be found in pharmacies and retail stores. How to rehydrate Fluids Follow instructions from your health care provider for rehydration. The kind of fluid and the amount you should drink depend on your condition. In general, you should choose drinks that you prefer.  If told by your health care provider, drink an ORS. ? Make an ORS by following instructions on the package. ? Start by drinking small amounts,  about  cup (120 mL) every 5-10 minutes. ? Slowly increase how much you drink until you have taken the amount recommended by your health care provider.  Drink enough clear fluids to keep your urine pale yellow. If you were told to drink an ORS, finish it first, then start slowly drinking other clear fluids. Drink fluids such as: ? Water. This includes sparkling water and flavored water. Drinking only water can lead to having too little sodium in your body (hyponatremia). Follow the advice of your health care provider. ? Water from ice chips you suck on. ? Fruit juice with water you add to it (diluted). ? Sports drinks. ? Hot or cold herbal teas. ? Broth-based soups. ? Milk or milk products. Food Follow instructions from your health care provider about what to eat while you rehydrate. Your health care provider may recommend that you slowly begin eating regular foods in small amounts.  Eat foods that contain a healthy balance of electrolytes, such as bananas, oranges, potatoes, tomatoes, and spinach.  Avoid foods that are greasy or contain a lot of sugar. In some cases, you may get nutrition through a feeding tube that is passed through your nose and into your stomach (nasogastric tube, or NG tube). This may be done if you have uncontrolled vomiting or diarrhea.   Beverages to avoid Certain beverages may make dehydration worse. While you rehydrate, avoid drinking alcohol.   How to tell if you are recovering from dehydration You may be recovering from dehydration if:  You are urinating more often than before you started rehydrating.  Your urine is pale yellow.  Your energy level   improves.  You vomit less frequently.  You have diarrhea less frequently.  Your appetite improves or returns to normal.  You feel less dizzy or less light-headed.  Your skin tone and color start to look more normal. Follow these instructions at home:  Take over-the-counter and prescription medicines only  as told by your health care provider.  Do not take sodium tablets. Doing this can lead to having too much sodium in your body (hypernatremia). Contact a health care provider if:  You continue to have symptoms of mild or moderate dehydration, such as: ? Thirst. ? Dry lips. ? Slightly dry mouth. ? Dizziness. ? Dark urine or less urine than normal. ? Muscle cramps.  You continue to vomit or have diarrhea. Get help right away if you:  Have symptoms of dehydration that get worse.  Have a fever.  Have a severe headache.  Have been vomiting and the following happens: ? Your vomiting gets worse or does not go away. ? Your vomit includes blood or green matter (bile). ? You cannot eat or drink without vomiting.  Have problems with urination or bowel movements, such as: ? Diarrhea that gets worse or does not go away. ? Blood in your stool (feces). This may cause stool to look black and tarry. ? Not urinating, or urinating only a small amount of very dark urine, within 6-8 hours.  Have trouble breathing.  Have symptoms that get worse with treatment. These symptoms may represent a serious problem that is an emergency. Do not wait to see if the symptoms will go away. Get medical help right away. Call your local emergency services (911 in the [place] Do not drive yourself to the hospital. Summary  Rehydration is the replacement of body fluids and minerals (electrolytes) that are lost during dehydration.  Follow instructions from your health care provider for rehydration. The kind of fluid and amount you should drink depend on your condition.  Slowly increase how much you drink until you have taken the amount recommended by your health care provider.  Contact your health care provider if you continue to show signs of mild or moderate dehydration. This information is not intended to replace advice given to you by your health care provider. Make sure you discuss any questions you have with  your health care provider. Document Revised: 07/31/2019 Document Reviewed: 06/10/2019 Elsevier Patient Education  2021 [employer].  

## 2020-10-16 ENCOUNTER — Inpatient Hospital Stay: Payer: Medicaid Other

## 2020-10-16 ENCOUNTER — Encounter: Payer: Self-pay | Admitting: Adult Health

## 2020-10-16 ENCOUNTER — Inpatient Hospital Stay (HOSPITAL_BASED_OUTPATIENT_CLINIC_OR_DEPARTMENT_OTHER): Payer: Medicaid Other | Admitting: Adult Health

## 2020-10-16 ENCOUNTER — Other Ambulatory Visit: Payer: Self-pay | Admitting: *Deleted

## 2020-10-16 VITALS — BP 107/60 | HR 113 | Resp 17

## 2020-10-16 VITALS — BP 119/61 | HR 116 | Temp 98.4°F | Resp 18 | Ht 67.0 in | Wt 135.1 lb

## 2020-10-16 DIAGNOSIS — Z95828 Presence of other vascular implants and grafts: Secondary | ICD-10-CM

## 2020-10-16 DIAGNOSIS — Z170 Estrogen receptor positive status [ER+]: Secondary | ICD-10-CM

## 2020-10-16 DIAGNOSIS — C50211 Malignant neoplasm of upper-inner quadrant of right female breast: Secondary | ICD-10-CM

## 2020-10-16 LAB — CMP (CANCER CENTER ONLY)
ALT: 8 U/L (ref 0–44)
AST: 24 U/L (ref 15–41)
Albumin: 3.3 g/dL — ABNORMAL LOW (ref 3.5–5.0)
Alkaline Phosphatase: 154 U/L — ABNORMAL HIGH (ref 38–126)
Anion gap: 14 (ref 5–15)
BUN: 12 mg/dL (ref 6–20)
CO2: 20 mmol/L — ABNORMAL LOW (ref 22–32)
Calcium: 10.8 mg/dL — ABNORMAL HIGH (ref 8.9–10.3)
Chloride: 104 mmol/L (ref 98–111)
Creatinine: 1.01 mg/dL — ABNORMAL HIGH (ref 0.44–1.00)
GFR, Estimated: >60 mL/min (ref >60)
Glucose, Bld: 122 mg/dL — ABNORMAL HIGH (ref 70–99)
Potassium: 3.8 mmol/L (ref 3.5–5.1)
Sodium: 138 mmol/L (ref 135–145)
Total Bilirubin: 0.6 mg/dL (ref 0.3–1.2)
Total Protein: 6.3 g/dL — ABNORMAL LOW (ref 6.5–8.1)

## 2020-10-16 LAB — CBC WITH DIFFERENTIAL (CANCER CENTER ONLY)
Abs Immature Granulocytes: 0.01 10*3/uL (ref 0.00–0.07)
Basophils Absolute: 0 10*3/uL (ref 0.0–0.1)
Basophils Relative: 1 %
Eosinophils Absolute: 0 10*3/uL (ref 0.0–0.5)
Eosinophils Relative: 0 %
HCT: 27 % — ABNORMAL LOW (ref 36.0–46.0)
Hemoglobin: 9.4 g/dL — ABNORMAL LOW (ref 12.0–15.0)
Immature Granulocytes: 0 %
Lymphocytes Relative: 22 %
Lymphs Abs: 0.7 10*3/uL (ref 0.7–4.0)
MCH: 29.7 pg (ref 26.0–34.0)
MCHC: 34.8 g/dL (ref 30.0–36.0)
MCV: 85.2 fL (ref 80.0–100.0)
Monocytes Absolute: 0.4 10*3/uL (ref 0.1–1.0)
Monocytes Relative: 13 %
Neutro Abs: 1.9 10*3/uL (ref 1.7–7.7)
Neutrophils Relative %: 64 %
Platelet Count: 154 10*3/uL (ref 150–400)
RBC: 3.17 MIL/uL — ABNORMAL LOW (ref 3.87–5.11)
RDW: 15.4 % (ref 11.5–15.5)
WBC Count: 3.1 10*3/uL — ABNORMAL LOW (ref 4.0–10.5)
nRBC: 0 % (ref 0.0–0.2)

## 2020-10-16 LAB — TSH: TSH: 0.362 u[IU]/mL (ref 0.308–3.960)

## 2020-10-16 MED ORDER — SODIUM CHLORIDE 0.9 % IV SOLN
Freq: Once | INTRAVENOUS | Status: AC
  Filled 2020-10-16: qty 250

## 2020-10-16 MED ORDER — SODIUM CHLORIDE 0.9 % IV SOLN
INTRAVENOUS | Status: DC
  Filled 2020-10-16 (×2): qty 250

## 2020-10-16 NOTE — Progress Notes (Signed)
[facility] Follow up:    Raiford Simmonds., PA-C [place] [address] [place] [address]   DIAGNOSIS: Cancer Staging Malignant neoplasm of upper-inner quadrant of right breast in female, estrogen receptor positive ([place]) Staging form: Breast, AJCC 8th Edition - Clinical stage from 03/31/2020: Stage IIIB (cT2, cN1(f), cM0, G3, ER-, PR-, HER2-) - Signed by Nicholas Lose, MD on 04/02/2020 Stage prefix: Initial diagnosis Method of lymph node assessment: Core biopsy Histologic grading system: 3 grade system   SUMMARY OF ONCOLOGIC HISTORY: Oncology History  Malignant neoplasm of upper-inner quadrant of right breast in female, estrogen receptor positive ([place])  01/2020 Initial Diagnosis   Palpable right breast mass growing since July 2021.  Mammogram revealed 2.2 cm right breast mass with a 0.8 cm satellite lesion.  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   03/31/2020 Cancer Staging   Staging form: Breast, AJCC 8th Edition - Clinical stage from 03/31/2020: Stage IIIB (cT2, cN1(f), cM0, G3, ER-, PR-, HER2-) - Signed by Nicholas Lose, MD on 04/02/2020   04/09/2020 - 08/06/2020 Chemotherapy    Patient is on Treatment Plan: BREAST AC Q21D / CARBOPLATIN D1 + PACLITAXEL D1,8,15 Q21D         CURRENT THERAPY: pre surgery, s/p chemo  INTERVAL HISTORY: Sharon Fischer 55 y.o. female returns for evaluation of her AKI that she was experiencing at the beginning of this week.  Since receiving IV fluids this week, her creatinine has improved, along with her appetite.  She notes her diarrhea has resolved, and her oral intake is improved.     Patient Active Problem List   Diagnosis Date Noted  . Port-A-Cath in place 05/20/2020  . Family history of throat cancer   . Malignant neoplasm of upper-inner quadrant of right breast in female, estrogen receptor  positive ([place]) 03/31/2020    has No Known Allergies.  MEDICAL HISTORY: Past Medical History:  Diagnosis Date  . Breast cancer ([place]) 03/2020  . Family history of throat cancer     SURGICAL HISTORY: Past Surgical History:  Procedure Laterality Date  . IR IMAGING GUIDED PORT INSERTION  03/30/2020    SOCIAL HISTORY: Social History   Socioeconomic History  . Marital status: Single    Spouse name: Not on file  . Number of children: Not on file  . Years of education: Not on file  . Highest education level: Not on file  Occupational History  . Not on file  Tobacco Use  . Smoking status: Current Every Day Smoker    Types: Cigarettes  . Smokeless tobacco: Never Used  . Tobacco comment: 7-8 cigs /day  Substance and Sexual Activity  . Alcohol use: Never  . Drug use: Never  . Sexual activity: Not on file  Other Topics Concern  . Not on file  Social History Narrative  . Not on file   Social Determinants of Health   Financial Resource Strain: Not on file  Food Insecurity: No Food Insecurity  . Worried About Charity fundraiser in the Last Year: Never true  . Ran Out of Food in the Last Year: Never true  Transportation Needs: No Transportation Needs  . Lack of Transportation (Medical): No  . Lack of Transportation (Non-Medical): No  Physical Activity: Not on file  Stress: Not on file  Social Connections: Not on file  Intimate Partner  Violence: Not on file    FAMILY HISTORY: Family History  Problem Relation Age of Onset  . Throat cancer Maternal Uncle        dx >50, smoker  . Throat cancer Maternal Uncle        dx >50, smoker  . Cancer Cousin        unknown type, dx >50 (paternal first cousin)  . Cancer Cousin        unknown type (paternal first cousin)    Review of Systems  Constitutional: Negative for appetite change, chills, fatigue, fever and unexpected weight change.  HENT:   Negative for hearing loss, lump/mass and trouble swallowing.   Eyes: Negative  for eye problems and icterus.  Respiratory: Negative for chest tightness, cough and shortness of breath.   Cardiovascular: Negative for chest pain, leg swelling and palpitations.  Gastrointestinal: Negative for abdominal distention, abdominal pain, constipation, diarrhea, nausea and vomiting.  Endocrine: Negative for hot flashes.  Genitourinary: Negative for difficulty urinating.   Musculoskeletal: Negative for arthralgias.  Skin: Negative for itching and rash.  Neurological: Negative for dizziness, extremity weakness, headaches and numbness.  Hematological: Negative for adenopathy. Does not bruise/bleed easily.  Psychiatric/Behavioral: Negative for depression. The patient is not nervous/anxious.       PHYSICAL EXAMINATION  ECOG PERFORMANCE STATUS: 1 - Symptomatic but completely ambulatory  Vitals:   10/16/20 1459  BP: 119/61  Pulse: (!) 116  Resp: 18  Temp: 98.4 F (36.9 C)  SpO2: 100%    Physical Exam Constitutional:      General: She is not in acute distress.    Appearance: Normal appearance. She is not toxic-appearing.  HENT:     Head: Normocephalic and atraumatic.  Eyes:     General: No scleral icterus. Cardiovascular:     Rate and Rhythm: Normal rate and regular rhythm.     Pulses: Normal pulses.     Heart sounds: Normal heart sounds.  Pulmonary:     Effort: Pulmonary effort is normal.     Breath sounds: Normal breath sounds.  Abdominal:     General: Abdomen is flat. Bowel sounds are normal. There is no distension.     Palpations: Abdomen is soft.     Tenderness: There is no abdominal tenderness.  Musculoskeletal:        General: No swelling.     Cervical back: Neck supple.  Lymphadenopathy:     Cervical: No cervical adenopathy.  Skin:    General: Skin is warm and dry.     Findings: No rash.  Neurological:     General: No focal deficit present.     Mental Status: She is alert.  Psychiatric:        Mood and Affect: Mood normal.        Behavior:  Behavior normal.     LABORATORY DATA:  CBC    Component Value Date/Time   WBC 3.1 (L) 10/16/2020 1427   WBC 5.4 02/05/2007 1044   RBC 3.17 (L) 10/16/2020 1427   HGB 9.4 (L) 10/16/2020 1427   HCT 27.0 (L) 10/16/2020 1427   PLT 154 10/16/2020 1427   MCV 85.2 10/16/2020 1427   MCH 29.7 10/16/2020 1427   MCHC 34.8 10/16/2020 1427   RDW 15.4 10/16/2020 1427   LYMPHSABS 0.7 10/16/2020 1427   MONOABS 0.4 10/16/2020 1427   EOSABS 0.0 10/16/2020 1427   BASOSABS 0.0 10/16/2020 1427    CMP     Component Value Date/Time   NA 138 10/16/2020  1427   K 3.8 10/16/2020 1427   CL 104 10/16/2020 1427   CO2 20 (L) 10/16/2020 1427   GLUCOSE 122 (H) 10/16/2020 1427   BUN 12 10/16/2020 1427   CREATININE 1.01 (H) 10/16/2020 1427   CALCIUM 10.8 (H) 10/16/2020 1427   PROT 6.3 (L) 10/16/2020 1427   ALBUMIN 3.3 (L) 10/16/2020 1427   AST 24 10/16/2020 1427   ALT 8 10/16/2020 1427   ALKPHOS 154 (H) 10/16/2020 1427   BILITOT 0.6 10/16/2020 1427   GFRNONAA >60 10/16/2020 1427   GFRAA  02/05/2007 1044    >60        The eGFR has been calculated using the MDRD equation. This calculation has not been validated in all clinical          ASSESSMENT and THERAPY PLAN:   Malignant neoplasm of upper-inner quadrant of right breast in female, estrogen receptor positive ([place]) 01/2020:Palpable right breast mass growing since July 2021. Mammogram revealed 2.2 cm right breast mass with a 0.8 cm satellite lesion. 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  Treatment plan 1. Neoadjuvant chemotherapy with Adriamycin and Cytoxanpembrolizumab4 followed by Taxol weekly 12 with carboplatin pembrolizumabevery 3 weeks x4, followed by pembrolizumab maintenance to complete 1 year 2. Followed by breast conserving surgery with targeted axillary dissection 3. Followed by adjuvant radiation  therapy -------------------------------------------------------------------------------------------------------------------------------------- Current treatment:Completed 4 cycles ofAdriamycin, Cytoxan, pembrolizumab, completed 4 cycles of Taxol (carbo andpembrolizumabevery 3 weeks): Chemo discontinued for toxicities  Patient's AKI is almost nearly resolved.  She will receive IV fluids today, and I will see her again next week for f/u.  I continued to recommend she drink enough water and remain active. She knows to call for any questions that may arise between now and her next appointment.  We are happy to see her sooner if needed.    All questions were answered. The patient knows to call the clinic with any problems, questions or concerns. We can certainly see the patient much sooner if necessary.  Total encounter time: 20 minutes in face to face visit time, reviewing lab work, placing orders, communicating with care providers, and documenting the visit.  Wilber Bihari, NP 10/17/20 4:50 PM Medical Oncology and Hematology [facility] [place], [place] [address] Tel. [phone]    Fax. [phone]  *Total Encounter Time as defined by the Centers for Medicare and Medicaid Services includes, in addition to the face-to-face time of a patient visit (documented in the note above) non-face-to-face time: obtaining and reviewing outside history, ordering and reviewing medications, tests or procedures, care coordination (communications with other health care professionals or caregivers) and documentation in the medical record.

## 2020-10-16 NOTE — Patient Instructions (Signed)
Rehydration, Adult Rehydration is the replacement of body fluids, salts, and minerals (electrolytes) that are lost during dehydration. Dehydration is when there is not enough water or other fluids in the body. This happens when you lose more fluids than you take in. Common causes of dehydration include:  Not drinking enough fluids. This can occur when you are ill or doing activities that require a lot of energy, especially in hot weather.  Conditions that cause loss of water or other fluids, such as diarrhea, vomiting, sweating, or urinating a lot.  Other illnesses, such as fever or infection.  Certain medicines, such as those that remove excess fluid from the body (diuretics). Symptoms of mild or moderate dehydration may include thirst, dry lips and mouth, and dizziness. Symptoms of severe dehydration may include increased heart rate, confusion, fainting, and not urinating. For severe dehydration, you may need to get fluids through an IV at the hospital. For mild or moderate dehydration, you can usually rehydrate at home by drinking certain fluids as told by your health care provider. What are the risks? Generally, rehydration is safe. However, taking in too much fluid (overhydration) can be a problem. This is rare. Overhydration can cause an electrolyte imbalance, kidney failure, or a decrease in salt (sodium) levels in the body. Supplies needed You will need an oral rehydration solution (ORS) if your health care provider tells you to use one. This is a drink to treat dehydration. It can be found in pharmacies and retail stores. How to rehydrate Fluids Follow instructions from your health care provider for rehydration. The kind of fluid and the amount you should drink depend on your condition. In general, you should choose drinks that you prefer.  If told by your health care provider, drink an ORS. ? Make an ORS by following instructions on the package. ? Start by drinking small amounts,  about  cup (120 mL) every 5-10 minutes. ? Slowly increase how much you drink until you have taken the amount recommended by your health care provider.  Drink enough clear fluids to keep your urine pale yellow. If you were told to drink an ORS, finish it first, then start slowly drinking other clear fluids. Drink fluids such as: ? Water. This includes sparkling water and flavored water. Drinking only water can lead to having too little sodium in your body (hyponatremia). Follow the advice of your health care provider. ? Water from ice chips you suck on. ? Fruit juice with water you add to it (diluted). ? Sports drinks. ? Hot or cold herbal teas. ? Broth-based soups. ? Milk or milk products. Food Follow instructions from your health care provider about what to eat while you rehydrate. Your health care provider may recommend that you slowly begin eating regular foods in small amounts.  Eat foods that contain a healthy balance of electrolytes, such as bananas, oranges, potatoes, tomatoes, and spinach.  Avoid foods that are greasy or contain a lot of sugar. In some cases, you may get nutrition through a feeding tube that is passed through your nose and into your stomach (nasogastric tube, or NG tube). This may be done if you have uncontrolled vomiting or diarrhea.   Beverages to avoid Certain beverages may make dehydration worse. While you rehydrate, avoid drinking alcohol.   How to tell if you are recovering from dehydration You may be recovering from dehydration if:  You are urinating more often than before you started rehydrating.  Your urine is pale yellow.  Your energy level   improves.  You vomit less frequently.  You have diarrhea less frequently.  Your appetite improves or returns to normal.  You feel less dizzy or less light-headed.  Your skin tone and color start to look more normal. Follow these instructions at home:  Take over-the-counter and prescription medicines only  as told by your health care provider.  Do not take sodium tablets. Doing this can lead to having too much sodium in your body (hypernatremia). Contact a health care provider if:  You continue to have symptoms of mild or moderate dehydration, such as: ? Thirst. ? Dry lips. ? Slightly dry mouth. ? Dizziness. ? Dark urine or less urine than normal. ? Muscle cramps.  You continue to vomit or have diarrhea. Get help right away if you:  Have symptoms of dehydration that get worse.  Have a fever.  Have a severe headache.  Have been vomiting and the following happens: ? Your vomiting gets worse or does not go away. ? Your vomit includes blood or green matter (bile). ? You cannot eat or drink without vomiting.  Have problems with urination or bowel movements, such as: ? Diarrhea that gets worse or does not go away. ? Blood in your stool (feces). This may cause stool to look black and tarry. ? Not urinating, or urinating only a small amount of very dark urine, within 6-8 hours.  Have trouble breathing.  Have symptoms that get worse with treatment. These symptoms may represent a serious problem that is an emergency. Do not wait to see if the symptoms will go away. Get medical help right away. Call your local emergency services (911 in the [place] Do not drive yourself to the hospital. Summary  Rehydration is the replacement of body fluids and minerals (electrolytes) that are lost during dehydration.  Follow instructions from your health care provider for rehydration. The kind of fluid and amount you should drink depend on your condition.  Slowly increase how much you drink until you have taken the amount recommended by your health care provider.  Contact your health care provider if you continue to show signs of mild or moderate dehydration. This information is not intended to replace advice given to you by your health care provider. Make sure you discuss any questions you have with  your health care provider. Document Revised: 07/31/2019 Document Reviewed: 06/10/2019 Elsevier Patient Education  2021 [employer].  

## 2020-10-17 NOTE — Assessment & Plan Note (Signed)
01/2020:Palpable right breast mass growing since July 2021. Mammogram revealed 2.2 cm right breast mass with a 0.8 cm satellite lesion. 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  Treatment plan 1. Neoadjuvant chemotherapy with Adriamycin and Cytoxanpembrolizumab4 followed by Taxol weekly 12 with carboplatin pembrolizumabevery 3 weeks x4, followed by pembrolizumab maintenance to complete 1 year 2. Followed by breast conserving surgery with targeted axillary dissection 3. Followed by adjuvant radiation therapy -------------------------------------------------------------------------------------------------------------------------------------- Current treatment:Completed 4 cycles ofAdriamycin, Cytoxan, pembrolizumab, completed 4 cycles of Taxol (carbo andpembrolizumabevery 3 weeks): Chemo discontinued for toxicities  Patient's AKI is almost nearly resolved.  She will receive IV fluids today, and I will see her again next week for f/u.  I continued to recommend she drink enough water and remain active. She knows to call for any questions that may arise between now and her next appointment.  We are happy to see her sooner if needed.  

## 2020-10-19 ENCOUNTER — Encounter: Payer: Self-pay | Admitting: *Deleted

## 2020-10-20 ENCOUNTER — Encounter: Payer: Self-pay | Admitting: Plastic Surgery

## 2020-10-20 ENCOUNTER — Telehealth: Payer: Self-pay | Admitting: Hematology and Oncology

## 2020-10-20 ENCOUNTER — Ambulatory Visit (INDEPENDENT_AMBULATORY_CARE_PROVIDER_SITE_OTHER): Payer: Medicaid Other | Admitting: Plastic Surgery

## 2020-10-20 ENCOUNTER — Telehealth: Payer: Self-pay | Admitting: Dietician

## 2020-10-20 ENCOUNTER — Ambulatory Visit: Payer: Self-pay | Admitting: General Surgery

## 2020-10-20 ENCOUNTER — Other Ambulatory Visit: Payer: Self-pay

## 2020-10-20 VITALS — BP 109/68 | HR 72 | Ht 67.0 in | Wt 139.2 lb

## 2020-10-20 DIAGNOSIS — C50211 Malignant neoplasm of upper-inner quadrant of right female breast: Secondary | ICD-10-CM | POA: Diagnosis not present

## 2020-10-20 DIAGNOSIS — Z170 Estrogen receptor positive status [ER+]: Secondary | ICD-10-CM | POA: Diagnosis not present

## 2020-10-20 NOTE — Progress Notes (Signed)
Patient ID: Sharon Fischer, female    DOB: [birth year], 55 y.o.   MRN: [id number]   Chief Complaint  Patient presents with  . Advice Only  . Breast Cancer    The patient is a 55 year old female here with her for consultation for breast reconstruction.  She is a patient of Dr. Ave Filter and Dr. Carolynne Edouard.  She was diagnosed with right breast cancer after she felt a lump last July.  She had a mammogram that revealed a 2.2 cm lump and a nodule.  She also had a nodule in the left breast that was 1.6 cm in size.  She had enlarged lymph nodes at the time.  The right breast biopsy came back grade 3 IDC triple negative with a Ki-67 of 80% and a positive lymph node.  The left breast came back as an intraductal papilloma.  She was started on chemotherapy and referred for reconstruction.  She has already decided she wants bilateral mastectomies.  She is a smoker but states that she will quit smoking.  She is 5 feet 7 inches tall and weighs 139 pounds.  Her preoperative breast size is a 38DD.  She would like to be smaller.  Her last chemotherapy treatment was February 2022.  She has hyperlipidemia, airway disease and hypertension.    Review of Systems  Constitutional: Negative.   HENT: Negative.   Eyes: Negative.   Respiratory: Negative.   Cardiovascular: Negative.   Gastrointestinal: Negative.   Endocrine: Negative.   Genitourinary: Negative.   Musculoskeletal: Negative.   Skin: Negative.   Hematological: Negative.   Psychiatric/Behavioral: Negative.     Past Medical History:  Diagnosis Date  . Breast cancer ([facility]) 03/2020  . Family history of throat cancer     Past Surgical History:  Procedure Laterality Date  . IR IMAGING GUIDED PORT INSERTION  03/30/2020      Current Outpatient Medications:  .  albuterol (VENTOLIN HFA) 108 (90 Base) MCG/ACT inhaler, Inhale 1-2 puffs into the lungs every 4 (four) hours as needed for wheezing or shortness of breath., Disp: 8 g, Rfl: 2 .  atorvastatin (LIPITOR)  40 MG tablet, Take by mouth., Disp: , Rfl:  .  dexamethasone (DECADRON) 1 MG tablet, Take 1 tablet (1 mg total) by mouth daily., Disp: 30 tablet, Rfl: 2 .  fluticasone (FLONASE) 50 MCG/ACT nasal spray, Place into both nostrils., Disp: , Rfl:  .  gabapentin (NEURONTIN) 300 MG capsule, Take 1 capsule (300 mg total) by mouth at bedtime., Disp: 30 capsule, Rfl: 3 .  lidocaine-prilocaine (EMLA) cream, Apply to affected area once, Disp: 30 g, Rfl: 3 .  lisinopril-hydrochlorothiazide (ZESTORETIC) 20-12.5 MG tablet, Take 1 tablet by mouth daily., Disp: , Rfl:  .  LORazepam (ATIVAN) 0.5 MG tablet, Take 1 tablet (0.5 mg total) by mouth at bedtime as needed for sleep., Disp: 30 tablet, Rfl: 0 .  ondansetron (ZOFRAN) 8 MG tablet, Take 1 tablet (8 mg total) by mouth every 8 (eight) hours as needed. Start on the third day after chemotherapy., Disp: 30 tablet, Rfl: 1 .  oxybutynin (DITROPAN-XL) 5 MG 24 hr tablet, Take 1 tablet (5 mg total) by mouth at bedtime., Disp: 30 tablet, Rfl: 1 .  potassium chloride SA (KLOR-CON) 20 MEQ tablet, Take 1 tablet (20 mEq total) by mouth daily., Disp: 14 tablet, Rfl: 0 .  prochlorperazine (COMPAZINE) 10 MG tablet, Take 1 tablet (10 mg total) by mouth every 6 (six) hours as needed (Nausea or vomiting)., Disp: 30 tablet,  Rfl: 1   Objective:   Vitals:   10/20/20 1249  BP: 109/68  Pulse: 72  SpO2: 96%    Physical Exam Vitals and nursing note reviewed.  Constitutional:      Appearance: Normal appearance.  HENT:     Head: Normocephalic and atraumatic.  Cardiovascular:     Rate and Rhythm: Normal rate.     Pulses: Normal pulses.  Pulmonary:     Effort: Pulmonary effort is normal. No respiratory distress.  Abdominal:     General: Abdomen is flat. There is no distension.  Skin:    General: Skin is warm.     Capillary Refill: Capillary refill takes less than 2 seconds.     Coloration: Skin is not jaundiced.     Findings: No bruising.  Neurological:     General: No  focal deficit present.     Mental Status: She is alert and oriented to person, place, and time.  Psychiatric:        Mood and Affect: Mood normal.        Behavior: Behavior normal.     Assessment & Plan:  Malignant neoplasm of upper-inner quadrant of right breast in female, estrogen receptor positive ([place])  We had a detailed conversation about the patient's options for breast reconstruction. Several reconstruction options were explained to the patient.  It is important to remember that breast reconstruction is an optional procedure. Reconstruction often requires several stages of surgery and this means more than one operation.  The surgeries are often done several months apart.  The entire process from start to finish can take a year or more. The major goal of breast reconstruction is to look normal in clothing. There will always be scars and a difference noticeable without clothes.  This is true for asymmetries where both breasts will not be identical.  Surgery may be needed or desired to the non-cancerous breast in order to achieve better symmetry and satisfactory results.  Regardless of the reconstructive method, there is always risks and the possibility that the procedure will fail or have complications.  This could required additional surgeries.    We discussed the available methods of breast reconstruction and included:  1. Tissue expander with Acellular dermal matrix followed by implant based reconstruction. This can be done as one surgery or multiple surgeries.  2. Autologous reconstruction can include using a muscle or tissue from another area of the body for the reconstruction.  3. Combined procedures like the latissismus dorsi flaps that often uses the muscle with an expander or implant.  For each of the method discussed the risks, benefits, scars and recovery time were discussed in detail. Specific risks included bleeding, infection, hematoma, seroma, scarring, pain, wound healing  complications, flap loss, fat necrosis, capsular contracture, need for implant removal, donor site complications, bulge, hernia, umbilical necrosis, need for urgent reoperation, and need for dressing changes.   After the options were discussed we focused on the patient's desires and the procedure that was best for her based on all the information.  A total of 50 minutes of face-to-face time was spent in this encounter, of which >50% was spent in counseling.    Due to the patient's smoking and breast size she is not a good candidate for nipple conserving mastectomies.  She desires bilateral mastectomies.  She has decided on implant-based reconstruction.  She knows that this is a staged procedure and will require at least 2 procedures if not 3.  Pictures were obtained of the  patient and placed in the chart with the patient's or guardian's permission.   [place], DO

## 2020-10-20 NOTE — Telephone Encounter (Signed)
Sch per 5/6 los, Pt aware 

## 2020-10-20 NOTE — Telephone Encounter (Signed)
Nutrition Assessment   Reason for Assessment: MST   ASSESSMENT: 55 year old female with breast cancer. Patient has completed neoadjuvant chemotherapy which was discontinued d/t toxicities. Patient is planning to undergo mastectomy followed by adjuvant radiation.   Spoke with patient via telephone. Introduced self and services available at [facility]. Patient reports her appetite is "much better" says she eats "lots of chicken" Her sister has been preparing meals for her. Yesterday patient had a sausage/egg biscuit, Ensure Max (150 kcal, 30 g protein), glass of water for breakfast, chicken alfredo, glass of water and Gatorade for lunch, a piece of chicken, mashed potatoes, baked mac/cheese, and turnip greens for dinner, snacked on a chocolate pudding and another piece of chicken before going to bed around 10:30. She denies nausea, vomiting, diarrhea. Patient reports she has an appointment scheduled this afternoon to discuss surgery.  Medications: Decadron, gabapentin, ativan, zofran, klor-con, compazine   Labs: 5/6 Glucose 122, Cr 1.01, Albumin 3.3   Anthropometrics: Weights have decreased 18 lbs from 153 lb 9.6 oz on 3/9 which is significant   Height: 5'7" Weight: 135 lb 1.6 oz (5/6) UBW: 180 lb (12/21) BMI: 21.16   Estimated Energy Needs  Kcals: 2000-2200 Protein: 100-110 Fluid: 2 L   NUTRITION DIAGNOSIS: Unintentional weight loss related to cancer and associated treatments as evidenced by 12% decrease in weight in 2 months and 25% weight loss from usual weight over the last 5 months; which is significant  Suspect patient likely meets criteria for malnutrition, however unable to identify without  NFPE    INTERVENTION:  Educated on adequate intake of calories and protein to maintain weight/strenth, and nutrition Discussed foods with protein and importance of protein to support post-op healing Encouraged high protein snacks in between meals, will mail handout  Continue bedtime snack  for added calories and protein Discussed types of supplements and recommended switching to Ensure Enlive (350 kcal, 20 g protein) or equivalent  Recommended drinking 2-3 nutrient dense supplements daily, will mail coupons  Will leave complimentary Ensure case at registration desk for pick up on Friday 5/13 - pt appreciative  MONITORING, EVALUATION, GOAL: weight trends, intake   Next Visit: via telephone ~3 weeks

## 2020-10-21 ENCOUNTER — Telehealth: Payer: Self-pay | Admitting: *Deleted

## 2020-10-21 ENCOUNTER — Other Ambulatory Visit: Payer: Self-pay | Admitting: General Surgery

## 2020-10-21 DIAGNOSIS — C50211 Malignant neoplasm of upper-inner quadrant of right female breast: Secondary | ICD-10-CM

## 2020-10-21 DIAGNOSIS — Z170 Estrogen receptor positive status [ER+]: Secondary | ICD-10-CM

## 2020-10-21 NOTE — Telephone Encounter (Addendum)
Faxed order to [facility] for Mastectomy Supplies for the patient.  Along with Demographics,Insurance information, and recent office notes.    Confirmation received and copy scanned into the chart.//AB/CMA

## 2020-10-22 ENCOUNTER — Encounter: Payer: Self-pay | Admitting: *Deleted

## 2020-10-23 ENCOUNTER — Other Ambulatory Visit: Payer: Self-pay

## 2020-10-23 ENCOUNTER — Inpatient Hospital Stay: Payer: Medicaid Other

## 2020-10-23 ENCOUNTER — Inpatient Hospital Stay (HOSPITAL_BASED_OUTPATIENT_CLINIC_OR_DEPARTMENT_OTHER): Payer: Medicaid Other | Admitting: Adult Health

## 2020-10-23 ENCOUNTER — Encounter: Payer: Self-pay | Admitting: Adult Health

## 2020-10-23 VITALS — BP 99/45 | HR 77 | Temp 97.5°F | Resp 18 | Ht 67.0 in | Wt 141.2 lb

## 2020-10-23 VITALS — BP 105/57 | HR 72

## 2020-10-23 DIAGNOSIS — Z170 Estrogen receptor positive status [ER+]: Secondary | ICD-10-CM

## 2020-10-23 DIAGNOSIS — Z171 Estrogen receptor negative status [ER-]: Secondary | ICD-10-CM

## 2020-10-23 DIAGNOSIS — C50211 Malignant neoplasm of upper-inner quadrant of right female breast: Secondary | ICD-10-CM | POA: Diagnosis not present

## 2020-10-23 DIAGNOSIS — C50212 Malignant neoplasm of upper-inner quadrant of left female breast: Secondary | ICD-10-CM

## 2020-10-23 DIAGNOSIS — Z95828 Presence of other vascular implants and grafts: Secondary | ICD-10-CM

## 2020-10-23 DIAGNOSIS — E876 Hypokalemia: Secondary | ICD-10-CM | POA: Diagnosis not present

## 2020-10-23 LAB — CBC WITH DIFFERENTIAL (CANCER CENTER ONLY)
Abs Immature Granulocytes: 0.01 10*3/uL (ref 0.00–0.07)
Basophils Absolute: 0.1 10*3/uL (ref 0.0–0.1)
Basophils Relative: 1 %
Eosinophils Absolute: 0.3 10*3/uL (ref 0.0–0.5)
Eosinophils Relative: 5 %
HCT: 28.4 % — ABNORMAL LOW (ref 36.0–46.0)
Hemoglobin: 9.2 g/dL — ABNORMAL LOW (ref 12.0–15.0)
Immature Granulocytes: 0 %
Lymphocytes Relative: 44 %
Lymphs Abs: 2.4 10*3/uL (ref 0.7–4.0)
MCH: 29.4 pg (ref 26.0–34.0)
MCHC: 32.4 g/dL (ref 30.0–36.0)
MCV: 90.7 fL (ref 80.0–100.0)
Monocytes Absolute: 0.7 10*3/uL (ref 0.1–1.0)
Monocytes Relative: 13 %
Neutro Abs: 2 10*3/uL (ref 1.7–7.7)
Neutrophils Relative %: 37 %
Platelet Count: 267 10*3/uL (ref 150–400)
RBC: 3.13 MIL/uL — ABNORMAL LOW (ref 3.87–5.11)
RDW: 18.1 % — ABNORMAL HIGH (ref 11.5–15.5)
WBC Count: 5.5 10*3/uL (ref 4.0–10.5)
nRBC: 0 % (ref 0.0–0.2)

## 2020-10-23 LAB — CMP (CANCER CENTER ONLY)
ALT: 32 U/L (ref 0–44)
AST: 48 U/L — ABNORMAL HIGH (ref 15–41)
Albumin: 3.1 g/dL — ABNORMAL LOW (ref 3.5–5.0)
Alkaline Phosphatase: 85 U/L (ref 38–126)
Anion gap: 4 — ABNORMAL LOW (ref 5–15)
BUN: 13 mg/dL (ref 6–20)
CO2: 28 mmol/L (ref 22–32)
Calcium: 8.5 mg/dL — ABNORMAL LOW (ref 8.9–10.3)
Chloride: 107 mmol/L (ref 98–111)
Creatinine: 0.7 mg/dL (ref 0.44–1.00)
GFR, Estimated: >60 mL/min (ref >60)
Glucose, Bld: 59 mg/dL — ABNORMAL LOW (ref 70–99)
Potassium: 2.9 mmol/L — ABNORMAL LOW (ref 3.5–5.1)
Sodium: 139 mmol/L (ref 135–145)
Total Bilirubin: 0.2 mg/dL — ABNORMAL LOW (ref 0.3–1.2)
Total Protein: 5.5 g/dL — ABNORMAL LOW (ref 6.5–8.1)

## 2020-10-23 MED ORDER — SODIUM CHLORIDE 0.9 % IV SOLN
INTRAVENOUS | Status: DC
  Filled 2020-10-23 (×2): qty 250

## 2020-10-23 MED ORDER — POTASSIUM CHLORIDE 10 MEQ/100ML IV SOLN
INTRAVENOUS | Status: AC
  Filled 2020-10-23: qty 200

## 2020-10-23 MED ORDER — SODIUM CHLORIDE 0.9% FLUSH
10.0000 mL | Freq: Once | INTRAVENOUS | Status: AC
  Administered 2020-10-23: 10 mL
  Filled 2020-10-23: qty 10

## 2020-10-23 MED ORDER — POTASSIUM CHLORIDE 10 MEQ/100ML IV SOLN
10.0000 meq | INTRAVENOUS | Status: AC
  Administered 2020-10-23 (×2): 10 meq via INTRAVENOUS

## 2020-10-23 MED ORDER — HEPARIN SOD (PORK) LOCK FLUSH 100 UNIT/ML IV SOLN
500.0000 [IU] | Freq: Once | INTRAVENOUS | Status: AC
Start: 2020-10-23 — End: 2020-10-23
  Administered 2020-10-23: 500 [IU]
  Filled 2020-10-23: qty 5

## 2020-10-23 NOTE — Assessment & Plan Note (Signed)
01/2020:Palpable right breast mass growing since July 2021. Mammogram revealed 2.2 cm right breast mass with a 0.8 cm satellite lesion. 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  Treatment plan 1. Neoadjuvant chemotherapy with Adriamycin and Cytoxanpembrolizumab4 followed by Taxol weekly 12 with carboplatin pembrolizumabevery 3 weeks x4, followed by pembrolizumab maintenance to complete 1 year 2. Followed by breast conserving surgery with targeted axillary dissection 3. Followed by adjuvant radiation therapy -------------------------------------------------------------------------------------------------------------------------------------- Current treatment:Completed 4 cycles ofAdriamycin, Cytoxan, pembrolizumab, completed 4 cycles of Taxol (carbo andpembrolizumabevery 3 weeks): Chemo discontinued for toxicities  Patient's AKI is resolved.  She is going to receive IV potassium today since her K is 2.9.  She will restart her daily tablet as well. She denies any symptoms with her decreased blood pressure. She notes it goes up and down.  She is not currently taking her lisinopril-hct and I told her to stay off of it until she sees her PCP on 5/23 as scheduled.    She is scheduled for surgery on 12/16/2020.  I reached out to our navigators to understand the delay, and was informed it is due to schedule coordination with Dr. Marla Roe and Dr. Marlou Starks, and the need for [facility] to have quit smoking 4-6 weeks prior to surgery for optimal healing.    We will see Julianne after her surgery, or sooner if needed.

## 2020-10-23 NOTE — Patient Instructions (Signed)
Rehydration, Adult Rehydration is the replacement of body fluids, salts, and minerals (electrolytes) that are lost during dehydration. Dehydration is when there is not enough water or other fluids in the body. This happens when you lose more fluids than you take in. Common causes of dehydration include:  Not drinking enough fluids. This can occur when you are ill or doing activities that require a lot of energy, especially in hot weather.  Conditions that cause loss of water or other fluids, such as diarrhea, vomiting, sweating, or urinating a lot.  Other illnesses, such as fever or infection.  Certain medicines, such as those that remove excess fluid from the body (diuretics). Symptoms of mild or moderate dehydration may include thirst, dry lips and mouth, and dizziness. Symptoms of severe dehydration may include increased heart rate, confusion, fainting, and not urinating. For severe dehydration, you may need to get fluids through an IV at the hospital. For mild or moderate dehydration, you can usually rehydrate at home by drinking certain fluids as told by your health care provider. What are the risks? Generally, rehydration is safe. However, taking in too much fluid (overhydration) can be a problem. This is rare. Overhydration can cause an electrolyte imbalance, kidney failure, or a decrease in salt (sodium) levels in the body. Supplies needed You will need an oral rehydration solution (ORS) if your health care provider tells you to use one. This is a drink to treat dehydration. It can be found in pharmacies and retail stores. How to rehydrate Fluids Follow instructions from your health care provider for rehydration. The kind of fluid and the amount you should drink depend on your condition. In general, you should choose drinks that you prefer.  If told by your health care provider, drink an ORS. ? Make an ORS by following instructions on the package. ? Start by drinking small amounts,  about  cup (120 mL) every 5-10 minutes. ? Slowly increase how much you drink until you have taken the amount recommended by your health care provider.  Drink enough clear fluids to keep your urine pale yellow. If you were told to drink an ORS, finish it first, then start slowly drinking other clear fluids. Drink fluids such as: ? Water. This includes sparkling water and flavored water. Drinking only water can lead to having too little sodium in your body (hyponatremia). Follow the advice of your health care provider. ? Water from ice chips you suck on. ? Fruit juice with water you add to it (diluted). ? Sports drinks. ? Hot or cold herbal teas. ? Broth-based soups. ? Milk or milk products. Food Follow instructions from your health care provider about what to eat while you rehydrate. Your health care provider may recommend that you slowly begin eating regular foods in small amounts.  Eat foods that contain a healthy balance of electrolytes, such as bananas, oranges, potatoes, tomatoes, and spinach.  Avoid foods that are greasy or contain a lot of sugar. In some cases, you may get nutrition through a feeding tube that is passed through your nose and into your stomach (nasogastric tube, or NG tube). This may be done if you have uncontrolled vomiting or diarrhea.   Beverages to avoid Certain beverages may make dehydration worse. While you rehydrate, avoid drinking alcohol.   How to tell if you are recovering from dehydration You may be recovering from dehydration if:  You are urinating more often than before you started rehydrating.  Your urine is pale yellow.  Your energy level   improves.  You vomit less frequently.  You have diarrhea less frequently.  Your appetite improves or returns to normal.  You feel less dizzy or less light-headed.  Your skin tone and color start to look more normal. Follow these instructions at home:  Take over-the-counter and prescription medicines only  as told by your health care provider.  Do not take sodium tablets. Doing this can lead to having too much sodium in your body (hypernatremia). Contact a health care provider if:  You continue to have symptoms of mild or moderate dehydration, such as: ? Thirst. ? Dry lips. ? Slightly dry mouth. ? Dizziness. ? Dark urine or less urine than normal. ? Muscle cramps.  You continue to vomit or have diarrhea. Get help right away if you:  Have symptoms of dehydration that get worse.  Have a fever.  Have a severe headache.  Have been vomiting and the following happens: ? Your vomiting gets worse or does not go away. ? Your vomit includes blood or green matter (bile). ? You cannot eat or drink without vomiting.  Have problems with urination or bowel movements, such as: ? Diarrhea that gets worse or does not go away. ? Blood in your stool (feces). This may cause stool to look black and tarry. ? Not urinating, or urinating only a small amount of very dark urine, within 6-8 hours.  Have trouble breathing.  Have symptoms that get worse with treatment. These symptoms may represent a serious problem that is an emergency. Do not wait to see if the symptoms will go away. Get medical help right away. Call your local emergency services (911 in the [place] Do not drive yourself to the hospital. Summary  Rehydration is the replacement of body fluids and minerals (electrolytes) that are lost during dehydration.  Follow instructions from your health care provider for rehydration. The kind of fluid and amount you should drink depend on your condition.  Slowly increase how much you drink until you have taken the amount recommended by your health care provider.  Contact your health care provider if you continue to show signs of mild or moderate dehydration. This information is not intended to replace advice given to you by your health care provider. Make sure you discuss any questions you have with  your health care provider. Document Revised: 07/31/2019 Document Reviewed: 06/10/2019 Elsevier Patient Education  2021 [employer].  

## 2020-10-23 NOTE — Progress Notes (Signed)
Sharon Fischer:    Raiford Simmonds., PA-C [place] [address] [place] [address]   DIAGNOSIS: Cancer Staging Malignant neoplasm of upper-inner quadrant of left breast in female, estrogen receptor negative ([place]) Staging form: Breast, AJCC 8th Edition - Clinical stage from 03/31/2020: Stage IIIB (cT2, cN1(f), cM0, G3, ER-, PR-, HER2-) - Signed by Nicholas Lose, MD on 04/02/2020 Stage prefix: Initial diagnosis Method of lymph node assessment: Core biopsy Histologic grading system: 3 grade system   SUMMARY OF ONCOLOGIC HISTORY: Oncology History  Malignant neoplasm of upper-inner quadrant of left breast in female, estrogen receptor negative ([place])  01/2020 Initial Diagnosis   Palpable right breast mass growing since July 2021.  Mammogram revealed 2.2 cm right breast mass with a 0.8 cm satellite lesion.  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   03/31/2020 Cancer Staging   Staging form: Breast, AJCC 8th Edition - Clinical stage from 03/31/2020: Stage IIIB (cT2, cN1(f), cM0, G3, ER-, PR-, HER2-) - Signed by Nicholas Lose, MD on 04/02/2020   04/09/2020 - 08/06/2020 Chemotherapy    Patient is on Treatment Plan: BREAST AC Q21D / CARBOPLATIN D1 + PACLITAXEL D1,8,15 Q21D         CURRENT THERAPY: s/p neoadjuvant chemotherapy.    INTERVAL HISTORY: Sharon Fischer 55 y.o. female returns for evaluation of her recent AKI and dehydration.  This has resolved.  Her BP is slightly low today however she is asymptomatic, and walked in here with a cane, compared to last week and prior being in a wheelchair.  She is feeling well.  She is eating and drinking more.  She has surgery scheduled and is ready to proceed with this in July.     Patient Active Problem List   Diagnosis Date Noted  . Port-A-Cath in place 05/20/2020  . Family history of throat  cancer   . Malignant neoplasm of upper-inner quadrant of left breast in female, estrogen receptor negative ([place]) 03/31/2020    has No Known Allergies.  MEDICAL HISTORY: Past Medical History:  Diagnosis Date  . Breast cancer ([place]) 03/2020  . Family history of throat cancer     SURGICAL HISTORY: Past Surgical History:  Procedure Laterality Date  . IR IMAGING GUIDED PORT INSERTION  03/30/2020    SOCIAL HISTORY: Social History   Socioeconomic History  . Marital status: Single    Spouse name: Not on file  . Number of children: Not on file  . Years of education: Not on file  . Highest education level: Not on file  Occupational History  . Not on file  Tobacco Use  . Smoking status: Current Every Day Smoker    Types: Cigarettes  . Smokeless tobacco: Never Used  . Tobacco comment: 7-8 cigs /day  Substance and Sexual Activity  . Alcohol use: Never  . Drug use: Never  . Sexual activity: Not on file  Other Topics Concern  . Not on file  Social History Narrative  . Not on file   Social Determinants of Health   Financial Resource Strain: Not on file  Food Insecurity: No Food Insecurity  . Worried About Charity fundraiser in the Last Year: Never true  . Ran Out of Food in the Last Year: Never true  Transportation Needs: No Transportation Needs  . Lack of Transportation (Medical): No  . Lack  of Transportation (Non-Medical): No  Physical Activity: Not on file  Stress: Not on file  Social Connections: Not on file  Intimate Partner Violence: Not on file    FAMILY HISTORY: Family History  Problem Relation Age of Onset  . Throat cancer Maternal Uncle        dx >50, smoker  . Throat cancer Maternal Uncle        dx >50, smoker  . Cancer Cousin        unknown type, dx >50 (paternal first cousin)  . Cancer Cousin        unknown type (paternal first cousin)    Review of Systems  Constitutional: Negative for appetite change, chills, fatigue, fever and unexpected weight  change.  HENT:   Negative for hearing loss, lump/mass and trouble swallowing.   Eyes: Negative for eye problems and icterus.  Respiratory: Negative for chest tightness, cough and shortness of breath.   Cardiovascular: Negative for chest pain, leg swelling and palpitations.  Gastrointestinal: Negative for abdominal distention, abdominal pain, constipation, diarrhea, nausea and vomiting.  Endocrine: Negative for hot flashes.  Genitourinary: Negative for difficulty urinating.   Musculoskeletal: Negative for arthralgias.  Skin: Negative for itching and rash.  Neurological: Negative for dizziness, extremity weakness, headaches and numbness.  Hematological: Negative for adenopathy. Does not bruise/bleed easily.  Psychiatric/Behavioral: Negative for depression. The patient is not nervous/anxious.      PHYSICAL EXAMINATION  ECOG PERFORMANCE STATUS: 1 - Symptomatic but completely ambulatory  Vitals:   10/23/20 1005  BP: (!) 99/45  Pulse: 77  Resp: 18  Temp: (!) 97.5 F (36.4 C)  SpO2: 100%    Physical Exam Constitutional:      General: She is not in acute distress.    Appearance: Normal appearance. She is not toxic-appearing.  HENT:     Head: Normocephalic and atraumatic.  Eyes:     General: No scleral icterus. Cardiovascular:     Rate and Rhythm: Normal rate and regular rhythm.     Pulses: Normal pulses.     Heart sounds: Normal heart sounds.  Pulmonary:     Effort: Pulmonary effort is normal.     Breath sounds: Normal breath sounds.  Abdominal:     General: Abdomen is flat. Bowel sounds are normal. There is no distension.     Palpations: Abdomen is soft.     Tenderness: There is no abdominal tenderness.  Musculoskeletal:        General: No swelling.     Cervical back: Neck supple.  Lymphadenopathy:     Cervical: No cervical adenopathy.  Skin:    General: Skin is warm and dry.     Findings: No rash.  Neurological:     General: No focal deficit present.     Mental  Status: She is alert.  Psychiatric:        Mood and Affect: Mood normal.        Behavior: Behavior normal.     LABORATORY DATA:  CBC    Component Value Date/Time   WBC 5.5 10/23/2020 0929   WBC 5.4 02/05/2007 1044   RBC 3.13 (L) 10/23/2020 0929   HGB 9.2 (L) 10/23/2020 0929   HCT 28.4 (L) 10/23/2020 0929   PLT 267 10/23/2020 0929   MCV 90.7 10/23/2020 0929   MCH 29.4 10/23/2020 0929   MCHC 32.4 10/23/2020 0929   RDW 18.1 (H) 10/23/2020 0929   LYMPHSABS 2.4 10/23/2020 0929   MONOABS 0.7 10/23/2020 0929   EOSABS 0.3  10/23/2020 0929   BASOSABS 0.1 10/23/2020 0929    CMP     Component Value Date/Time   NA 139 10/23/2020 0929   K 2.9 (L) 10/23/2020 0929   CL 107 10/23/2020 0929   CO2 28 10/23/2020 0929   GLUCOSE 59 (L) 10/23/2020 0929   BUN 13 10/23/2020 0929   CREATININE 0.70 10/23/2020 0929   CALCIUM 8.5 (L) 10/23/2020 0929   PROT 5.5 (L) 10/23/2020 0929   ALBUMIN 3.1 (L) 10/23/2020 0929   AST 48 (H) 10/23/2020 0929   ALT 32 10/23/2020 0929   ALKPHOS 85 10/23/2020 0929   BILITOT 0.2 (L) 10/23/2020 0929   GFRNONAA >60 10/23/2020 0929   GFRAA  02/05/2007 1044    >60        The eGFR has been calculated using the MDRD equation. This calculation has not been validated in all clinical         ASSESSMENT and THERAPY PLAN:   Malignant neoplasm of upper-inner quadrant of left breast in female, estrogen receptor negative ([place]) 01/2020:Palpable right breast mass growing since July 2021. Mammogram revealed 2.2 cm right breast mass with a 0.8 cm satellite lesion. 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  Treatment plan 1. Neoadjuvant chemotherapy with Adriamycin and Cytoxanpembrolizumab4 followed by Taxol weekly 12 with carboplatin pembrolizumabevery 3 weeks x4, followed by pembrolizumab maintenance to complete 1 year 2. Followed by breast  conserving surgery with targeted axillary dissection 3. Followed by adjuvant radiation therapy -------------------------------------------------------------------------------------------------------------------------------------- Current treatment:Completed 4 cycles ofAdriamycin, Cytoxan, pembrolizumab, completed 4 cycles of Taxol (carbo andpembrolizumabevery 3 weeks): Chemo discontinued for toxicities  Patient's AKI is resolved.  She is going to receive IV potassium today since her K is 2.9.  She will restart her daily tablet as well. She denies any symptoms with her decreased blood pressure. She notes it goes Fischer and down.  She is not currently taking her lisinopril-hct and I told her to stay off of it until she sees her PCP on 5/23 as scheduled.    She is scheduled for surgery on 12/16/2020.  I reached out to our navigators to understand the delay, and was informed it is due to schedule coordination with Dr. Marla Roe and Dr. Marlou Starks, and the need for [facility] to have quit smoking 4-6 weeks prior to surgery for optimal healing.    We will see Nayab after her surgery, or sooner if needed.     All questions were answered. The patient knows to call the clinic with any problems, questions or concerns. We can certainly see the patient much sooner if necessary.  Total encounter time: 43 minutes* in reviewing patient labs, placing orders, face to face visit time, collaboration with nurse navigators, and collaboration with Dr. Lindi Adie, along with documenting the encounter.   Wilber Bihari, NP 10/23/20 10:56 AM Medical Oncology and Hematology [facility] [place], [place] [address] Tel. [phone]    Fax. [phone]  *Total Encounter Time as defined by the Centers for Medicare and Medicaid Services includes, in addition to the face-to-face time of a patient visit (documented in the note above) non-face-to-face time: obtaining and reviewing outside history, ordering and  reviewing medications, tests or procedures, care coordination (communications with other health care professionals or caregivers) and documentation in the medical record.

## 2020-10-26 ENCOUNTER — Encounter: Payer: Self-pay | Admitting: *Deleted

## 2020-11-02 ENCOUNTER — Telehealth: Payer: Self-pay | Admitting: Hematology and Oncology

## 2020-11-02 ENCOUNTER — Other Ambulatory Visit: Payer: Self-pay | Admitting: *Deleted

## 2020-11-02 DIAGNOSIS — G629 Polyneuropathy, unspecified: Secondary | ICD-10-CM

## 2020-11-02 MED ORDER — GABAPENTIN 300 MG PO CAPS: 300.0000 mg | ORAL_CAPSULE | Freq: Two times a day (BID) | ORAL | 0 refills | Status: DC

## 2020-11-02 NOTE — Telephone Encounter (Signed)
Schedule appointment per 05/16 schedule message. Left a detailed message. 

## 2020-11-02 NOTE — Telephone Encounter (Signed)
Received call from pt with complaint of neuropathy unrelieved by gabapentin 300 mg daily at bedtime.  Per MD pt needing to increase Gabapentin to 300 mg BID if neuropathy symptoms do not improve in 1 week, pt may increase to TID.  Pt educated and verbalized understanding.

## 2020-11-23 ENCOUNTER — Encounter: Payer: Self-pay | Admitting: *Deleted

## 2020-11-24 ENCOUNTER — Other Ambulatory Visit: Payer: Self-pay

## 2020-11-24 ENCOUNTER — Ambulatory Visit (INDEPENDENT_AMBULATORY_CARE_PROVIDER_SITE_OTHER): Payer: Medicaid Other | Admitting: Surgical

## 2020-11-24 ENCOUNTER — Encounter: Payer: Self-pay | Admitting: Surgical

## 2020-11-24 VITALS — BP 132/81 | HR 77 | Ht 67.0 in | Wt 145.0 lb

## 2020-11-24 DIAGNOSIS — Z170 Estrogen receptor positive status [ER+]: Secondary | ICD-10-CM

## 2020-11-24 DIAGNOSIS — C50211 Malignant neoplasm of upper-inner quadrant of right female breast: Secondary | ICD-10-CM

## 2020-11-24 MED ORDER — ONDANSETRON HCL 4 MG PO TABS: 4.0000 mg | ORAL_TABLET | Freq: Three times a day (TID) | ORAL | 0 refills | Status: DC | PRN

## 2020-11-24 MED ORDER — CEPHALEXIN 500 MG PO CAPS: 500.0000 mg | ORAL_CAPSULE | Freq: Four times a day (QID) | ORAL | 0 refills | Status: AC

## 2020-11-24 MED ORDER — HYDROCODONE-ACETAMINOPHEN 5-325 MG PO TABS: 1.0000 | ORAL_TABLET | Freq: Four times a day (QID) | ORAL | 0 refills | Status: AC | PRN

## 2020-11-24 MED ORDER — DIAZEPAM 2 MG PO TABS: 2.0000 mg | ORAL_TABLET | Freq: Two times a day (BID) | ORAL | 0 refills | Status: DC | PRN

## 2020-11-24 NOTE — H&P (View-Only) (Signed)
Patient ID: Sharon Fischer, female    DOB: 1966-04-18, 55 y.o.   MRN: [id number]  Chief Complaint  Patient presents with   Pre-op Exam      ICD-10-CM   1. Malignant neoplasm of upper-inner quadrant of right breast in female, estrogen receptor positive ([place])  C50.211    Z17.0        History of Present Illness: Sharon Fischer is a 55 y.o.  female  with a history of right breast cancer and left breast intraductal papilloma.  She presents for preoperative evaluation for upcoming procedure, immediate bilateral breast reconstruction with placement of tissue expander and Flex HD, scheduled for 12/16/2020 with Dr. Marla Roe after bilateral total mastectomy, right radioactive seed seed guided right sentinel lymph node dissection, right sentinel lymph node biopsy and removal of Port-A-Cath by Dr. Marlou Starks on 12/16/2020.  The patient has not had problems with anesthesia. No history of DVT/PE. Sister with hx of DVT.  No family or personal history of bleeding or clotting disorders.  Patient is not currently taking any blood thinners.  No history of CVA/MI.   Summary of Previous Visit: Patient was diagnosed with right breast cancer after she felt a lump last July.  She has had chemotherapy.  She has decided she wants bilateral mastectomies.  She is a smoker but states that she will quit smoking.  Her preoperative breast size is 38 double D.  Her last chemotherapy treatment was February 2022.   PMH Significant for: Hyperlipidemia, airway disease  Patient is a smoker, reports approximately 5 cigarettes per day. She is trying to quit.  Past Medical History: Allergies: No Known Allergies  Current Medications:  Current Outpatient Medications:    albuterol (VENTOLIN HFA) 108 (90 Base) MCG/ACT inhaler, Inhale 1-2 puffs into the lungs every 4 (four) hours as needed for wheezing or shortness of breath., Disp: 8 g, Rfl: 2   atorvastatin (LIPITOR) 40 MG tablet, Take by mouth., Disp: , Rfl:    dexamethasone  (DECADRON) 1 MG tablet, Take 1 tablet (1 mg total) by mouth daily., Disp: 30 tablet, Rfl: 2   gabapentin (NEURONTIN) 300 MG capsule, Take 1 capsule (300 mg total) by mouth 2 (two) times daily., Disp: 60 capsule, Rfl: 0   lisinopril-hydrochlorothiazide (ZESTORETIC) 20-12.5 MG tablet, Take 1 tablet by mouth daily., Disp: , Rfl:    fluticasone (FLONASE) 50 MCG/ACT nasal spray, Place into both nostrils. (Patient not taking: Reported on 11/24/2020), Disp: , Rfl:    lidocaine-prilocaine (EMLA) cream, Apply to affected area once (Patient not taking: Reported on 11/24/2020), Disp: 30 g, Rfl: 3   LORazepam (ATIVAN) 0.5 MG tablet, Take 1 tablet (0.5 mg total) by mouth at bedtime as needed for sleep. (Patient not taking: Reported on 11/24/2020), Disp: 30 tablet, Rfl: 0   ondansetron (ZOFRAN) 8 MG tablet, Take 1 tablet (8 mg total) by mouth every 8 (eight) hours as needed. Start on the third day after chemotherapy. (Patient not taking: Reported on 11/24/2020), Disp: 30 tablet, Rfl: 1   oxybutynin (DITROPAN-XL) 5 MG 24 hr tablet, Take 1 tablet (5 mg total) by mouth at bedtime., Disp: 30 tablet, Rfl: 1   potassium chloride SA (KLOR-CON) 20 MEQ tablet, Take 1 tablet (20 mEq total) by mouth daily., Disp: 14 tablet, Rfl: 0   prochlorperazine (COMPAZINE) 10 MG tablet, Take 1 tablet (10 mg total) by mouth every 6 (six) hours as needed (Nausea or vomiting)., Disp: 30 tablet, Rfl: 1  Past Medical Problems: Past Medical History:  Diagnosis  Date   Breast cancer ([place]) 03/2020   Family history of throat cancer     Past Surgical History: Past Surgical History:  Procedure Laterality Date   IR IMAGING GUIDED PORT INSERTION  03/30/2020    Social History: Social History   Socioeconomic History   Marital status: Single    Spouse name: Not on file   Number of children: Not on file   Years of education: Not on file   Highest education level: Not on file  Occupational History   Not on file  Tobacco Use   Smoking  status: Every Day    Pack years: 0.00    Types: Cigarettes   Smokeless tobacco: Never   Tobacco comments:    7-8 cigs /day  Substance and Sexual Activity   Alcohol use: Never   Drug use: Never   Sexual activity: Not on file  Other Topics Concern   Not on file  Social History Narrative   Not on file   Social Determinants of Health   Financial Resource Strain: Not on file  Food Insecurity: No Food Insecurity   Worried About Running Out of Food in the Last Year: Never true   [place] in the Last Year: Never true  Transportation Needs: No Transportation Needs   Lack of Transportation (Medical): No   Lack of Transportation (Non-Medical): No  Physical Activity: Not on file  Stress: Not on file  Social Connections: Not on file  Intimate Partner Violence: Not on file    Family History: Family History  Problem Relation Age of Onset   Throat cancer Maternal Uncle        dx >50, smoker   Throat cancer Maternal Uncle        dx >50, smoker   Cancer Cousin        unknown type, dx >50 (paternal first cousin)   Cancer Cousin        unknown type (paternal first cousin)    Review of Systems: Review of Systems  Constitutional: Negative.   Respiratory: Negative.    Cardiovascular: Negative.   Gastrointestinal: Negative.   Neurological: Negative.    Physical Exam: Vital Signs BP 132/81 (BP Location: Left Arm, Patient Position: Sitting, Cuff Size: Normal)   Pulse 77   Ht 5\' 7"  (1.702 m)   Wt 145 lb (65.8 kg)   SpO2 100%   BMI 22.71 kg/m   Physical Exam  Constitutional:      General: Not in acute distress.    Appearance: Normal appearance. Not ill-appearing.  HENT:     Head: Normocephalic and atraumatic.  Eyes:     Pupils: Pupils are equal, round Neck:     Musculoskeletal: Normal range of motion.  Cardiovascular:     Rate and Rhythm: Normal rate    Pulses: Normal pulses.  Pulmonary:     Effort: Pulmonary effort is normal. No respiratory distress.   Abdominal:     General: Abdomen is flat. There is no distension.  Musculoskeletal: Normal range of motion.  Skin:    General: Skin is warm and dry.     Findings: No erythema or rash.  Neurological:     General: No focal deficit present.     Mental Status: Alert and oriented to person, place, and time. Mental status is at baseline.     Motor: No weakness.  Psychiatric:        Mood and Affect: Mood normal.        Behavior: Behavior  normal.    Assessment/Plan: The patient is scheduled for immediate bilateral breast reconstruction and placement of tissue expanders and Flex HD with Dr. Marla Roe.  Risks, benefits, and alternatives of procedure discussed, questions answered and consent obtained.    Smoking Status: current daily smoker; Counseling Given? Yes, patient is aware of the increased risk of postoperative complications including wound healing issues, increased risk of DVT.  Caprini Score: 5, high; Risk Factors include: Age, history of breast cancer, and length of planned surgery. Recommendation for mechanical and pharmacological prophylaxis for 7 to 10 days postoperatively. Encourage early ambulation.   Pictures obtained: 10/20/2020  Post-op Rx sent to pharmacy: Norco, Zofran, Keflex, Valium  Patient was provided with the breast reconstruction and General Surgical Risk consent document and Pain Medication Agreement prior to their appointment.  They had adequate time to read through the risk consent documents and Pain Medication Agreement. We also discussed them in person together during this preop appointment. All of their questions were answered to their satisfaction.  Recommended calling if they have any further questions.  Risk consent form and Pain Medication Agreement to be scanned into patient's chart.  The risks that can be encountered with and after placement of a breast expander placement were discussed and include the following but not limited to these: bleeding, infection,  delayed healing, anesthesia risks, skin sensation changes, injury to structures including nerves, blood vessels, and muscles which may be temporary or permanent, allergies to tape, suture materials and glues, blood products, topical preparations or injected agents, skin contour irregularities, skin discoloration and swelling, deep vein thrombosis, cardiac and pulmonary complications, pain, which may persist, fluid accumulation, wrinkling of the skin over the expander, changes in nipple or breast sensation, expander leakage or rupture, faulty position of the expander, persistent pain, formation of tight scar tissue around the expander (capsular contracture), possible need for revisional surgery or staged procedures.  Patient is aware that she is at an increased risk of postoperative complications given her smoking status.   Electronically signed by: Carola Rhine Orlander [place], PA-C 11/24/2020 11:58 AM

## 2020-11-24 NOTE — Progress Notes (Signed)
Patient ID: Sharon Fischer, female    DOB: 03-02-1966, 55 y.o.   MRN: [id number]  Chief Complaint  Patient presents with   Pre-op Exam      ICD-10-CM   1. Malignant neoplasm of upper-inner quadrant of right breast in female, estrogen receptor positive ([place])  C50.211    Z17.0        History of Present Illness: Sharon Fischer is a 55 y.o.  female  with a history of right breast cancer and left breast intraductal papilloma.  She presents for preoperative evaluation for upcoming procedure, immediate bilateral breast reconstruction with placement of tissue expander and Flex HD, scheduled for 12/16/2020 with Dr. Marla Roe after bilateral total mastectomy, right radioactive seed seed guided right sentinel lymph node dissection, right sentinel lymph node biopsy and removal of Port-A-Cath by Dr. Marlou Starks on 12/16/2020.  The patient has not had problems with anesthesia. No history of DVT/PE. Sister with hx of DVT.  No family or personal history of bleeding or clotting disorders.  Patient is not currently taking any blood thinners.  No history of CVA/MI.   Summary of Previous Visit: Patient was diagnosed with right breast cancer after she felt a lump last July.  She has had chemotherapy.  She has decided she wants bilateral mastectomies.  She is a smoker but states that she will quit smoking.  Her preoperative breast size is 38 double D.  Her last chemotherapy treatment was February 2022.   PMH Significant for: Hyperlipidemia, airway disease  Patient is a smoker, reports approximately 5 cigarettes per day. She is trying to quit.  Past Medical History: Allergies: No Known Allergies  Current Medications:  Current Outpatient Medications:    albuterol (VENTOLIN HFA) 108 (90 Base) MCG/ACT inhaler, Inhale 1-2 puffs into the lungs every 4 (four) hours as needed for wheezing or shortness of breath., Disp: 8 g, Rfl: 2   atorvastatin (LIPITOR) 40 MG tablet, Take by mouth., Disp: , Rfl:    dexamethasone  (DECADRON) 1 MG tablet, Take 1 tablet (1 mg total) by mouth daily., Disp: 30 tablet, Rfl: 2   gabapentin (NEURONTIN) 300 MG capsule, Take 1 capsule (300 mg total) by mouth 2 (two) times daily., Disp: 60 capsule, Rfl: 0   lisinopril-hydrochlorothiazide (ZESTORETIC) 20-12.5 MG tablet, Take 1 tablet by mouth daily., Disp: , Rfl:    fluticasone (FLONASE) 50 MCG/ACT nasal spray, Place into both nostrils. (Patient not taking: Reported on 11/24/2020), Disp: , Rfl:    lidocaine-prilocaine (EMLA) cream, Apply to affected area once (Patient not taking: Reported on 11/24/2020), Disp: 30 g, Rfl: 3   LORazepam (ATIVAN) 0.5 MG tablet, Take 1 tablet (0.5 mg total) by mouth at bedtime as needed for sleep. (Patient not taking: Reported on 11/24/2020), Disp: 30 tablet, Rfl: 0   ondansetron (ZOFRAN) 8 MG tablet, Take 1 tablet (8 mg total) by mouth every 8 (eight) hours as needed. Start on the third day after chemotherapy. (Patient not taking: Reported on 11/24/2020), Disp: 30 tablet, Rfl: 1   oxybutynin (DITROPAN-XL) 5 MG 24 hr tablet, Take 1 tablet (5 mg total) by mouth at bedtime., Disp: 30 tablet, Rfl: 1   potassium chloride SA (KLOR-CON) 20 MEQ tablet, Take 1 tablet (20 mEq total) by mouth daily., Disp: 14 tablet, Rfl: 0   prochlorperazine (COMPAZINE) 10 MG tablet, Take 1 tablet (10 mg total) by mouth every 6 (six) hours as needed (Nausea or vomiting)., Disp: 30 tablet, Rfl: 1  Past Medical Problems: Past Medical History:  Diagnosis  Date   Breast cancer ([place]) 03/2020   Family history of throat cancer     Past Surgical History: Past Surgical History:  Procedure Laterality Date   IR IMAGING GUIDED PORT INSERTION  03/30/2020    Social History: Social History   Socioeconomic History   Marital status: Single    Spouse name: Not on file   Number of children: Not on file   Years of education: Not on file   Highest education level: Not on file  Occupational History   Not on file  Tobacco Use   Smoking  status: Every Day    Pack years: 0.00    Types: Cigarettes   Smokeless tobacco: Never   Tobacco comments:    7-8 cigs /day  Substance and Sexual Activity   Alcohol use: Never   Drug use: Never   Sexual activity: Not on file  Other Topics Concern   Not on file  Social History Narrative   Not on file   Social Determinants of Health   Financial Resource Strain: Not on file  Food Insecurity: No Food Insecurity   Worried About Running Out of Food in the Last Year: Never true   [place] in the Last Year: Never true  Transportation Needs: No Transportation Needs   Lack of Transportation (Medical): No   Lack of Transportation (Non-Medical): No  Physical Activity: Not on file  Stress: Not on file  Social Connections: Not on file  Intimate Partner Violence: Not on file    Family History: Family History  Problem Relation Age of Onset   Throat cancer Maternal Uncle        dx >50, smoker   Throat cancer Maternal Uncle        dx >50, smoker   Cancer Cousin        unknown type, dx >50 (paternal first cousin)   Cancer Cousin        unknown type (paternal first cousin)    Review of Systems: Review of Systems  Constitutional: Negative.   Respiratory: Negative.    Cardiovascular: Negative.   Gastrointestinal: Negative.   Neurological: Negative.    Physical Exam: Vital Signs BP 132/81 (BP Location: Left Arm, Patient Position: Sitting, Cuff Size: Normal)   Pulse 77   Ht 5\' 7"  (1.702 m)   Wt 145 lb (65.8 kg)   SpO2 100%   BMI 22.71 kg/m   Physical Exam  Constitutional:      General: Not in acute distress.    Appearance: Normal appearance. Not ill-appearing.  HENT:     Head: Normocephalic and atraumatic.  Eyes:     Pupils: Pupils are equal, round Neck:     Musculoskeletal: Normal range of motion.  Cardiovascular:     Rate and Rhythm: Normal rate    Pulses: Normal pulses.  Pulmonary:     Effort: Pulmonary effort is normal. No respiratory distress.   Abdominal:     General: Abdomen is flat. There is no distension.  Musculoskeletal: Normal range of motion.  Skin:    General: Skin is warm and dry.     Findings: No erythema or rash.  Neurological:     General: No focal deficit present.     Mental Status: Alert and oriented to person, place, and time. Mental status is at baseline.     Motor: No weakness.  Psychiatric:        Mood and Affect: Mood normal.        Behavior: Behavior  normal.    Assessment/Plan: The patient is scheduled for immediate bilateral breast reconstruction and placement of tissue expanders and Flex HD with Dr. Marla Roe.  Risks, benefits, and alternatives of procedure discussed, questions answered and consent obtained.    Smoking Status: current daily smoker; Counseling Given? Yes, patient is aware of the increased risk of postoperative complications including wound healing issues, increased risk of DVT.  Caprini Score: 5, high; Risk Factors include: Age, history of breast cancer, and length of planned surgery. Recommendation for mechanical and pharmacological prophylaxis for 7 to 10 days postoperatively. Encourage early ambulation.   Pictures obtained: 10/20/2020  Post-op Rx sent to pharmacy: Norco, Zofran, Keflex, Valium  Patient was provided with the breast reconstruction and General Surgical Risk consent document and Pain Medication Agreement prior to their appointment.  They had adequate time to read through the risk consent documents and Pain Medication Agreement. We also discussed them in person together during this preop appointment. All of their questions were answered to their satisfaction.  Recommended calling if they have any further questions.  Risk consent form and Pain Medication Agreement to be scanned into patient's chart.  The risks that can be encountered with and after placement of a breast expander placement were discussed and include the following but not limited to these: bleeding, infection,  delayed healing, anesthesia risks, skin sensation changes, injury to structures including nerves, blood vessels, and muscles which may be temporary or permanent, allergies to tape, suture materials and glues, blood products, topical preparations or injected agents, skin contour irregularities, skin discoloration and swelling, deep vein thrombosis, cardiac and pulmonary complications, pain, which may persist, fluid accumulation, wrinkling of the skin over the expander, changes in nipple or breast sensation, expander leakage or rupture, faulty position of the expander, persistent pain, formation of tight scar tissue around the expander (capsular contracture), possible need for revisional surgery or staged procedures.  Patient is aware that she is at an increased risk of postoperative complications given her smoking status.   Electronically signed by: Carola Rhine Kayvan Hoefling, PA-C 11/24/2020 11:58 AM

## 2020-12-09 NOTE — Pre-Procedure Instructions (Signed)
Surgical Instructions:    Your procedure is scheduled on Wednesday, July 6th (09:30 AM- 2:30 PM).  Report to [facility] Main Entrance "A" at 07:30 A.M., then check in with the Admitting office.  Call this number if you have any questions prior to your surgery date, or have problems the morning of surgery:  [phone]    Remember:  Do not eat after midnight the night before your surgery.  You may drink clear liquids until 06:30 AM the morning of your surgery.   Clear liquids allowed are: Water, Non-Citrus Juices (without pulp), Carbonated Beverages, Clear Tea, Black Coffee Only, and Gatorade.    Take these medicines the morning of surgery with A SIP OF WATER:   cephALEXin (KEFLEX) dexamethasone (DECADRON) gabapentin (NEURONTIN)  IF NEEDED: acetaminophen (TYLENOL)  diazepam (VALIUM) HYDROcodone-acetaminophen (NORCO/VICODIN)  ondansetron (ZOFRAN)  albuterol (VENTOLIN HFA) inhaler- Please bring with you the day of surgery.     As of today, STOP taking any Aspirin (unless otherwise instructed by your surgeon) Aleve, Naproxen, Ibuprofen, Motrin, Advil, Goody's, BC's, all herbal medications, fish oil, and all vitamins.             Special instructions:    [place]- Preparing For Surgery  Before surgery, you can play an important role. Because skin is not sterile, your skin needs to be as free of germs as possible. You can reduce the number of germs on your skin by washing with CHG (chlorahexidine gluconate) Soap before surgery.  CHG is an antiseptic cleaner which kills germs and bonds with the skin to continue killing germs even after washing.     Please do not use if you have an allergy to CHG or antibacterial soaps. If your skin becomes reddened/irritated stop using the CHG.  Do not shave (including legs and underarms) for at least 48 hours prior to first CHG shower. It is OK to shave your face.  Please follow these instructions carefully.     Shower the NIGHT BEFORE  SURGERY and the MORNING OF SURGERY with CHG Soap.   If you chose to wash your hair, wash your hair first as usual with your normal shampoo. After you shampoo, rinse your hair and body thoroughly to remove the shampoo.  Then [employer] and genitals (private parts) with your normal soap and rinse thoroughly to remove soap.  After that Use CHG Soap as you would any other liquid soap. You can apply CHG directly to the skin and wash gently with a scrungie or a clean washcloth.   Apply the CHG Soap to your body ONLY FROM THE NECK DOWN.  Do not use on open wounds or open sores. Avoid contact with your eyes, ears, mouth and genitals (private parts). Wash Face and genitals (private parts)  with your normal soap.   Wash thoroughly, paying special attention to the area where your surgery will be performed.  Thoroughly rinse your body with warm water from the neck down.  DO NOT shower/wash with your normal soap after using and rinsing off the CHG Soap.  Pat yourself dry with a CLEAN TOWEL.  Wear CLEAN PAJAMAS to bed the night before surgery  Place CLEAN SHEETS on your bed the night before your surgery  DO NOT SLEEP WITH PETS.   Day of Surgery:  Take a shower with CHG soap. Wear Clean/Comfortable clothing the morning of surgery Do not wear lotions, powders, perfumes, or deodorant.   Remember to brush your teeth WITH YOUR REGULAR TOOTHPASTE. Do not wear jewelry  or makeup. DO Not wear nail polish, gel polish, artificial nails, or any other type of covering on natural nails including finger and toenails. If patients have artificial nails, gel coating, etc. that need to be removed by a nail salon please have this removed prior to surgery or surgery may need to be canceled/delayed if the surgeon/ anesthesia feels like the patient is unable to be adequately monitored. Do not shave 48 hours prior to surgery.   Do not bring valuables to the hospital. [facility] is not responsible for any belongings or  valuables.  Do NOT Smoke (Tobacco/Vaping) or drink Alcohol 24 hours prior to your procedure.  If you use a CPAP at night, you may bring all equipment for your overnight stay.   Contacts, glasses, dentures or bridgework may not be worn into surgery, please bring cases for these belongings.   For patients admitted to the hospital, discharge time will be determined by your treatment team.  Patients discharged the day of surgery will not be allowed to drive home, and someone needs to stay with them for 24 hours.  ONLY 1 SUPPORT PERSON MAY BE PRESENT WHILE YOU ARE IN SURGERY. IF YOU ARE TO BE ADMITTED ONCE YOU ARE IN YOUR ROOM YOU WILL BE ALLOWED TWO (2) VISITORS.  Minor children may have two parents present. Special consideration for safety and communication needs will be reviewed on a case by case basis.     Please read over the following fact sheets that you were given.

## 2020-12-09 NOTE — Progress Notes (Signed)
Clarified order sets containing different verbiage in consents w/ Dr. Marlou Starks. Per Dr. Marlou Starks, keep both consents.

## 2020-12-10 ENCOUNTER — Inpatient Hospital Stay (HOSPITAL_COMMUNITY)
Admission: RE | Admit: 2020-12-10 | Discharge: 2020-12-10 | Disposition: A | Discharge status: Home / Self Care | Payer: Medicaid Other | Source: Ambulatory Visit

## 2020-12-10 NOTE — Progress Notes (Signed)
Pt missed 11 am PAT appt today. Pt re-scheduled for tomorrow @ 11 AM.

## 2020-12-11 ENCOUNTER — Other Ambulatory Visit: Payer: Self-pay

## 2020-12-11 ENCOUNTER — Encounter (HOSPITAL_COMMUNITY)
Admission: RE | Admit: 2020-12-11 | Discharge: 2020-12-11 | Disposition: A | Discharge status: Home / Self Care | Payer: Medicaid Other | Source: Ambulatory Visit | Attending: General Surgery | Admitting: General Surgery

## 2020-12-11 ENCOUNTER — Encounter (HOSPITAL_COMMUNITY): Payer: Self-pay

## 2020-12-11 DIAGNOSIS — Z01818 Encounter for other preprocedural examination: Secondary | ICD-10-CM | POA: Insufficient documentation

## 2020-12-11 HISTORY — DX: Essential (primary) hypertension: I10

## 2020-12-11 LAB — CBC
HCT: 40.7 % (ref 36.0–46.0)
Hemoglobin: 13 g/dL (ref 12.0–15.0)
MCH: 30.2 pg (ref 26.0–34.0)
MCHC: 31.9 g/dL (ref 30.0–36.0)
MCV: 94.4 fL (ref 80.0–100.0)
Platelets: 235 10*3/uL (ref 150–400)
RBC: 4.31 MIL/uL (ref 3.87–5.11)
RDW: 16.7 % — ABNORMAL HIGH (ref 11.5–15.5)
WBC: 6.3 10*3/uL (ref 4.0–10.5)
nRBC: 0 % (ref 0.0–0.2)

## 2020-12-11 LAB — BASIC METABOLIC PANEL
Anion gap: 8 (ref 5–15)
BUN: 8 mg/dL (ref 6–20)
CO2: 28 mmol/L (ref 22–32)
Calcium: 9.5 mg/dL (ref 8.9–10.3)
Chloride: 105 mmol/L (ref 98–111)
Creatinine, Ser: 0.68 mg/dL (ref 0.44–1.00)
GFR, Estimated: >60 mL/min (ref >60)
Glucose, Bld: 89 mg/dL (ref 70–99)
Potassium: 3.3 mmol/L — ABNORMAL LOW (ref 3.5–5.1)
Sodium: 141 mmol/L (ref 135–145)

## 2020-12-11 NOTE — Pre-Procedure Instructions (Signed)
Surgical Instructions:    Your procedure is scheduled on Wednesday, July 6th.  Report to [facility] Main Entrance "A" at 07:30 A.M., then check in with the Admitting office.  Call this number if you have any questions prior to your surgery date, or have problems the morning of surgery:  [phone]    Remember:  Do not eat after midnight the night before your surgery.  You may drink clear liquids until 06:30 AM the morning of your surgery.   Clear liquids allowed are: Water, Non-Citrus Juices (without pulp), Carbonated Beverages, Clear Tea, Black Coffee Only, and Gatorade.    Take these medicines the morning of surgery with A SIP OF WATER:   cephALEXin (KEFLEX) dexamethasone (DECADRON) gabapentin (NEURONTIN)  IF NEEDED: acetaminophen (TYLENOL)  diazepam (VALIUM) HYDROcodone-acetaminophen (NORCO/VICODIN)  ondansetron (ZOFRAN)  albuterol (VENTOLIN HFA) inhaler- Please bring with you the day of surgery.    As of today, STOP taking any Aspirin (unless otherwise instructed by your surgeon) Aleve, Naproxen, Ibuprofen, Motrin, Advil, Goody's, BC's, all herbal medications, fish oil, and all vitamins.            Special instructions:    [place]- Preparing For Surgery  Before surgery, you can play an important role. Because skin is not sterile, your skin needs to be as free of germs as possible. You can reduce the number of germs on your skin by washing with CHG (chlorahexidine gluconate) Soap before surgery.  CHG is an antiseptic cleaner which kills germs and bonds with the skin to continue killing germs even after washing.     Please do not use if you have an allergy to CHG or antibacterial soaps. If your skin becomes reddened/irritated stop using the CHG.  Do not shave (including legs and underarms) for at least 48 hours prior to first CHG shower. It is OK to shave your face.  Please follow these instructions carefully.     Shower the NIGHT BEFORE SURGERY and the MORNING OF  SURGERY with CHG Soap.   If you chose to wash your hair, wash your hair first as usual with your normal shampoo. After you shampoo, rinse your hair and body thoroughly to remove the shampoo.  Then [employer] and genitals (private parts) with your normal soap and rinse thoroughly to remove soap.  After that Use CHG Soap as you would any other liquid soap. You can apply CHG directly to the skin and wash gently with a scrungie or a clean washcloth.   Apply the CHG Soap to your body ONLY FROM THE NECK DOWN.  Do not use on open wounds or open sores. Avoid contact with your eyes, ears, mouth and genitals (private parts). Wash Face and genitals (private parts)  with your normal soap.   Wash thoroughly, paying special attention to the area where your surgery will be performed.  Thoroughly rinse your body with warm water from the neck down.  DO NOT shower/wash with your normal soap after using and rinsing off the CHG Soap.  Pat yourself dry with a CLEAN TOWEL.  Wear CLEAN PAJAMAS to bed the night before surgery  Place CLEAN SHEETS on your bed the night before your surgery  DO NOT SLEEP WITH PETS.   Day of Surgery:  Take a shower with CHG soap. Wear Clean/Comfortable clothing the morning of surgery Do not wear lotions, powders, perfumes, or deodorant.   Remember to brush your teeth WITH YOUR REGULAR TOOTHPASTE. Do not wear jewelry, makeup, or nail polish Do not  shave 48 hours prior to surgery.   Do not bring valuables to the hospital. [facility] is not responsible for any belongings or valuables.  Do NOT Smoke (Tobacco/Vaping) or drink Alcohol 24 hours prior to your procedure.  If you use a CPAP at night, you may bring all equipment for your overnight stay.   Contacts, glasses, dentures or bridgework may not be worn into surgery, please bring cases for these belongings.   For patients admitted to the hospital, discharge time will be determined by your treatment team.  Patients  discharged the day of surgery will not be allowed to drive home, and someone needs to stay with them for 24 hours.  ONLY 1 SUPPORT PERSON MAY BE PRESENT WHILE YOU ARE IN SURGERY. IF YOU ARE TO BE ADMITTED ONCE YOU ARE IN YOUR ROOM YOU WILL BE ALLOWED TWO (2) VISITORS.  Minor children may have two parents present. Special consideration for safety and communication needs will be reviewed on a case by case basis.     Please read over the following fact sheets that you were given.

## 2020-12-11 NOTE — Progress Notes (Signed)
PCP - Dr. Darleene Cleaver  Chest x-ray - n/a EKG - 12/11/20  ERAS Protcol - yes, no drink ordered or given   COVID TEST- 12/15/20   Anesthesia review: n/a  Patient denies shortness of breath, fever, cough and chest pain at PAT appointment   All instructions explained to the patient, with a verbal understanding of the material. Patient agrees to go over the instructions while at home for a better understanding. Patient also instructed to self quarantine after being tested for COVID-19. The opportunity to ask questions was provided.

## 2020-12-15 ENCOUNTER — Other Ambulatory Visit: Payer: Self-pay | Admitting: General Surgery

## 2020-12-15 ENCOUNTER — Other Ambulatory Visit: Payer: Self-pay

## 2020-12-15 ENCOUNTER — Ambulatory Visit
Admission: RE | Admit: 2020-12-15 | Discharge: 2020-12-15 | Disposition: A | Discharge status: Home / Self Care | Payer: Medicaid Other | Source: Ambulatory Visit | Attending: General Surgery | Admitting: General Surgery

## 2020-12-15 ENCOUNTER — Other Ambulatory Visit (HOSPITAL_COMMUNITY)
Admission: RE | Admit: 2020-12-15 | Discharge: 2020-12-15 | Disposition: A | Discharge status: Home / Self Care | Payer: Medicaid Other | Source: Ambulatory Visit | Attending: General Surgery | Admitting: General Surgery

## 2020-12-15 DIAGNOSIS — Z20822 Contact with and (suspected) exposure to covid-19: Secondary | ICD-10-CM | POA: Diagnosis not present

## 2020-12-15 DIAGNOSIS — Z01812 Encounter for preprocedural laboratory examination: Secondary | ICD-10-CM | POA: Diagnosis not present

## 2020-12-15 DIAGNOSIS — C50211 Malignant neoplasm of upper-inner quadrant of right female breast: Secondary | ICD-10-CM

## 2020-12-15 DIAGNOSIS — Z170 Estrogen receptor positive status [ER+]: Secondary | ICD-10-CM

## 2020-12-16 ENCOUNTER — Encounter (HOSPITAL_COMMUNITY)
Admission: RE | Admit: 2020-12-16 | Discharge: 2020-12-16 | Disposition: A | Discharge status: Home / Self Care | Payer: Medicaid Other | Source: Ambulatory Visit | Attending: General Surgery | Admitting: General Surgery

## 2020-12-16 ENCOUNTER — Ambulatory Visit
Admission: RE | Admit: 2020-12-16 | Discharge: 2020-12-16 | Disposition: A | Discharge status: Home / Self Care | Payer: Medicaid Other | Source: Ambulatory Visit | Attending: General Surgery | Admitting: General Surgery

## 2020-12-16 ENCOUNTER — Encounter (HOSPITAL_COMMUNITY): Payer: Self-pay | Admitting: General Surgery

## 2020-12-16 ENCOUNTER — Encounter (HOSPITAL_COMMUNITY)
Admission: RE | Disposition: A | Discharge status: Home / Self Care | Payer: Self-pay | Source: Home / Self Care | Attending: Plastic Surgery

## 2020-12-16 ENCOUNTER — Observation Stay (HOSPITAL_COMMUNITY)
Admission: RE | Admit: 2020-12-16 | Discharge: 2020-12-17 | Disposition: A | Discharge status: Home / Self Care | Payer: Medicaid Other | Attending: Plastic Surgery | Admitting: Plastic Surgery

## 2020-12-16 ENCOUNTER — Ambulatory Visit (HOSPITAL_COMMUNITY): Payer: Medicaid Other | Admitting: Anesthesiology

## 2020-12-16 DIAGNOSIS — N6022 Fibroadenosis of left breast: Secondary | ICD-10-CM | POA: Diagnosis not present

## 2020-12-16 DIAGNOSIS — L720 Epidermal cyst: Secondary | ICD-10-CM | POA: Insufficient documentation

## 2020-12-16 DIAGNOSIS — C773 Secondary and unspecified malignant neoplasm of axilla and upper limb lymph nodes: Secondary | ICD-10-CM | POA: Insufficient documentation

## 2020-12-16 DIAGNOSIS — Z170 Estrogen receptor positive status [ER+]: Secondary | ICD-10-CM

## 2020-12-16 DIAGNOSIS — C50919 Malignant neoplasm of unspecified site of unspecified female breast: Secondary | ICD-10-CM | POA: Diagnosis present

## 2020-12-16 DIAGNOSIS — Z20822 Contact with and (suspected) exposure to covid-19: Secondary | ICD-10-CM | POA: Diagnosis not present

## 2020-12-16 DIAGNOSIS — C50211 Malignant neoplasm of upper-inner quadrant of right female breast: Principal | ICD-10-CM | POA: Insufficient documentation

## 2020-12-16 DIAGNOSIS — D242 Benign neoplasm of left breast: Secondary | ICD-10-CM | POA: Diagnosis not present

## 2020-12-16 HISTORY — PX: TOTAL MASTECTOMY: SHX6129

## 2020-12-16 HISTORY — PX: BREAST RECONSTRUCTION WITH PLACEMENT OF TISSUE EXPANDER AND FLEX HD (ACELLULAR HYDRATED DERMIS): SHX6295

## 2020-12-16 HISTORY — PX: AXILLARY SENTINEL NODE BIOPSY: SHX5738

## 2020-12-16 HISTORY — PX: PORT-A-CATH REMOVAL: SHX5289

## 2020-12-16 HISTORY — PX: BREAST CYST EXCISION: SHX579

## 2020-12-16 LAB — SARS CORONAVIRUS 2 (TAT 6-24 HRS): SARS Coronavirus 2: NEGATIVE

## 2020-12-16 LAB — HIV ANTIBODY (ROUTINE TESTING W REFLEX): HIV Screen 4th Generation wRfx: NONREACTIVE

## 2020-12-16 LAB — SARS CORONAVIRUS 2 BY RT PCR (HOSPITAL ORDER, PERFORMED IN ~~LOC~~ HOSPITAL LAB): SARS Coronavirus 2: NEGATIVE

## 2020-12-16 SURGERY — MASTECTOMY, SIMPLE
Anesthesia: General | Site: Breast | Laterality: Right

## 2020-12-16 MED ORDER — ONDANSETRON HCL 4 MG/2ML IJ SOLN: 4.0000 mg | Freq: Four times a day (QID) | INTRAMUSCULAR | Status: DC | PRN

## 2020-12-16 MED ORDER — FENTANYL CITRATE (PF) 100 MCG/2ML IJ SOLN
INTRAMUSCULAR | Status: AC
  Filled 2020-12-16: qty 2

## 2020-12-16 MED ORDER — CHLORHEXIDINE GLUCONATE CLOTH 2 % EX PADS: 6.0000 | MEDICATED_PAD | Freq: Once | CUTANEOUS | Status: DC

## 2020-12-16 MED ORDER — LACTATED RINGERS IV SOLN: INTRAVENOUS | Status: DC

## 2020-12-16 MED ORDER — BUPIVACAINE-EPINEPHRINE (PF) 0.25% -1:200000 IJ SOLN
INTRAMUSCULAR | Status: AC
  Filled 2020-12-16: qty 30

## 2020-12-16 MED ORDER — ROPIVACAINE HCL 5 MG/ML IJ SOLN
INTRAMUSCULAR | Status: DC | PRN
  Administered 2020-12-16: 30 mL via PERINEURAL

## 2020-12-16 MED ORDER — DIAZEPAM 2 MG PO TABS: 2.0000 mg | ORAL_TABLET | Freq: Two times a day (BID) | ORAL | Status: DC | PRN

## 2020-12-16 MED ORDER — ACETAMINOPHEN 325 MG PO TABS
325.0000 mg | ORAL_TABLET | Freq: Four times a day (QID) | ORAL | Status: DC
  Administered 2020-12-16: 325 mg via ORAL
  Filled 2020-12-16: qty 1

## 2020-12-16 MED ORDER — IBUPROFEN 400 MG PO TABS
400.0000 mg | ORAL_TABLET | Freq: Four times a day (QID) | ORAL | Status: DC
  Administered 2020-12-16 – 2020-12-17 (×3): 400 mg via ORAL
  Filled 2020-12-16 (×3): qty 1

## 2020-12-16 MED ORDER — ONDANSETRON 4 MG PO TBDP
4.0000 mg | ORAL_TABLET | Freq: Four times a day (QID) | ORAL | Status: DC | PRN
Start: 2020-12-16 — End: 2020-12-17

## 2020-12-16 MED ORDER — CHLORHEXIDINE GLUCONATE 0.12 % MT SOLN: 15.0000 mL | Freq: Once | OROMUCOSAL | Status: AC

## 2020-12-16 MED ORDER — ACETAMINOPHEN 500 MG PO TABS
ORAL_TABLET | ORAL | Status: AC
  Administered 2020-12-16: 1000 mg via ORAL
  Filled 2020-12-16: qty 2

## 2020-12-16 MED ORDER — MIDAZOLAM HCL 2 MG/2ML IJ SOLN
INTRAMUSCULAR | Status: AC
  Administered 2020-12-16: 2 mg via INTRAVENOUS
  Filled 2020-12-16: qty 2

## 2020-12-16 MED ORDER — KETAMINE HCL 10 MG/ML IJ SOLN
INTRAMUSCULAR | Status: DC | PRN
  Administered 2020-12-16: 20 mg via INTRAVENOUS
  Administered 2020-12-16: 10 mg via INTRAVENOUS

## 2020-12-16 MED ORDER — ONDANSETRON HCL 4 MG/2ML IJ SOLN
INTRAMUSCULAR | Status: AC
  Filled 2020-12-16: qty 2

## 2020-12-16 MED ORDER — SODIUM CHLORIDE (PF) 0.9 % IJ SOLN
INTRAMUSCULAR | Status: AC
  Filled 2020-12-16: qty 10

## 2020-12-16 MED ORDER — FENTANYL CITRATE (PF) 100 MCG/2ML IJ SOLN
25.0000 ug | INTRAMUSCULAR | Status: DC | PRN
  Administered 2020-12-16: 50 ug via INTRAVENOUS
  Administered 2020-12-16 (×4): 25 ug via INTRAVENOUS

## 2020-12-16 MED ORDER — PHENYLEPHRINE 40 MCG/ML (10ML) SYRINGE FOR IV PUSH (FOR BLOOD PRESSURE SUPPORT)
PREFILLED_SYRINGE | INTRAVENOUS | Status: AC
  Filled 2020-12-16: qty 10

## 2020-12-16 MED ORDER — FENTANYL CITRATE (PF) 100 MCG/2ML IJ SOLN
INTRAMUSCULAR | Status: AC
  Administered 2020-12-16: 100 ug via INTRAVENOUS
  Filled 2020-12-16: qty 2

## 2020-12-16 MED ORDER — PHENYLEPHRINE HCL-NACL 10-0.9 MG/250ML-% IV SOLN
INTRAVENOUS | Status: DC | PRN
  Administered 2020-12-16: 40 ug/min via INTRAVENOUS

## 2020-12-16 MED ORDER — DEXAMETHASONE SODIUM PHOSPHATE 10 MG/ML IJ SOLN
INTRAMUSCULAR | Status: DC | PRN
  Administered 2020-12-16: 5 mg

## 2020-12-16 MED ORDER — SODIUM CHLORIDE 0.9 % IV SOLN
INTRAVENOUS | Status: AC
Start: 2020-12-16 — End: 2020-12-16

## 2020-12-16 MED ORDER — FENTANYL CITRATE (PF) 250 MCG/5ML IJ SOLN
INTRAMUSCULAR | Status: AC
  Filled 2020-12-16: qty 5

## 2020-12-16 MED ORDER — GABAPENTIN 300 MG PO CAPS: 300.0000 mg | ORAL_CAPSULE | ORAL | Status: AC

## 2020-12-16 MED ORDER — 0.9 % SODIUM CHLORIDE (POUR BTL) OPTIME
TOPICAL | Status: DC | PRN
  Administered 2020-12-16 (×2): 1000 mL

## 2020-12-16 MED ORDER — DEXAMETHASONE SODIUM PHOSPHATE 10 MG/ML IJ SOLN
INTRAMUSCULAR | Status: DC | PRN
  Administered 2020-12-16: 5 mg via INTRAVENOUS

## 2020-12-16 MED ORDER — ACETAMINOPHEN 500 MG PO TABS: 1000.0000 mg | ORAL_TABLET | ORAL | Status: AC

## 2020-12-16 MED ORDER — KETAMINE HCL 50 MG/5ML IJ SOSY
PREFILLED_SYRINGE | INTRAMUSCULAR | Status: AC
  Filled 2020-12-16: qty 5

## 2020-12-16 MED ORDER — METHOCARBAMOL 500 MG PO TABS
ORAL_TABLET | ORAL | Status: AC
  Filled 2020-12-16: qty 1

## 2020-12-16 MED ORDER — BUPIVACAINE-EPINEPHRINE (PF) 0.25% -1:200000 IJ SOLN
INTRAMUSCULAR | Status: DC | PRN
Start: 2020-12-16 — End: 2020-12-16
  Administered 2020-12-16: 10 mL

## 2020-12-16 MED ORDER — ZOLPIDEM TARTRATE 5 MG PO TABS: 5.0000 mg | ORAL_TABLET | Freq: Every evening | ORAL | Status: DC | PRN

## 2020-12-16 MED ORDER — LACTATED RINGERS IV SOLN: INTRAVENOUS | Status: DC | PRN

## 2020-12-16 MED ORDER — HYDROCODONE-ACETAMINOPHEN 5-325 MG PO TABS
1.0000 | ORAL_TABLET | ORAL | Status: DC | PRN
  Administered 2020-12-16 – 2020-12-17 (×2): 2 via ORAL
  Filled 2020-12-16 (×2): qty 2

## 2020-12-16 MED ORDER — DIPHENHYDRAMINE HCL 50 MG/ML IJ SOLN: 12.5000 mg | Freq: Four times a day (QID) | INTRAMUSCULAR | Status: DC | PRN

## 2020-12-16 MED ORDER — FENTANYL CITRATE (PF) 100 MCG/2ML IJ SOLN
100.0000 ug | Freq: Once | INTRAMUSCULAR | Status: AC
  Filled 2020-12-16: qty 2

## 2020-12-16 MED ORDER — ALBUMIN HUMAN 5 % IV SOLN: INTRAVENOUS | Status: DC | PRN

## 2020-12-16 MED ORDER — SODIUM CHLORIDE 0.9 % IV SOLN: INTRAVENOUS | Status: AC

## 2020-12-16 MED ORDER — ONDANSETRON HCL 4 MG/2ML IJ SOLN
INTRAMUSCULAR | Status: DC | PRN
  Administered 2020-12-16: 4 mg via INTRAVENOUS

## 2020-12-16 MED ORDER — PROPOFOL 10 MG/ML IV BOLUS
INTRAVENOUS | Status: DC | PRN
  Administered 2020-12-16: 80 mg via INTRAVENOUS

## 2020-12-16 MED ORDER — ROCURONIUM BROMIDE 10 MG/ML (PF) SYRINGE
PREFILLED_SYRINGE | INTRAVENOUS | Status: DC | PRN
  Administered 2020-12-16: 60 mg via INTRAVENOUS

## 2020-12-16 MED ORDER — SUGAMMADEX SODIUM 200 MG/2ML IV SOLN
INTRAVENOUS | Status: DC | PRN
  Administered 2020-12-16: 150 mg via INTRAVENOUS

## 2020-12-16 MED ORDER — CEFAZOLIN SODIUM 1 G IJ SOLR
INTRAMUSCULAR | Status: AC
  Filled 2020-12-16: qty 20

## 2020-12-16 MED ORDER — MIDAZOLAM HCL 2 MG/2ML IJ SOLN
INTRAMUSCULAR | Status: AC
  Filled 2020-12-16: qty 2

## 2020-12-16 MED ORDER — METHOCARBAMOL 500 MG PO TABS
500.0000 mg | ORAL_TABLET | Freq: Four times a day (QID) | ORAL | Status: DC | PRN
  Administered 2020-12-16 – 2020-12-17 (×3): 500 mg via ORAL
  Filled 2020-12-16 (×2): qty 1

## 2020-12-16 MED ORDER — METHYLENE BLUE 0.5 % INJ SOLN
INTRAVENOUS | Status: AC
  Filled 2020-12-16: qty 10

## 2020-12-16 MED ORDER — ORAL CARE MOUTH RINSE: 15.0000 mL | Freq: Once | OROMUCOSAL | Status: AC

## 2020-12-16 MED ORDER — DEXAMETHASONE SODIUM PHOSPHATE 10 MG/ML IJ SOLN
INTRAMUSCULAR | Status: AC
  Filled 2020-12-16: qty 1

## 2020-12-16 MED ORDER — CELECOXIB 200 MG PO CAPS: 200.0000 mg | ORAL_CAPSULE | ORAL | Status: AC

## 2020-12-16 MED ORDER — GABAPENTIN 300 MG PO CAPS
ORAL_CAPSULE | ORAL | Status: AC
  Administered 2020-12-16: 300 mg via ORAL
  Filled 2020-12-16: qty 1

## 2020-12-16 MED ORDER — CELECOXIB 200 MG PO CAPS
ORAL_CAPSULE | ORAL | Status: AC
  Administered 2020-12-16: 200 mg via ORAL
  Filled 2020-12-16: qty 1

## 2020-12-16 MED ORDER — LIDOCAINE 2% (20 MG/ML) 5 ML SYRINGE
INTRAMUSCULAR | Status: AC
  Filled 2020-12-16: qty 5

## 2020-12-16 MED ORDER — HYDROMORPHONE HCL 1 MG/ML IJ SOLN
0.2500 mg | INTRAMUSCULAR | Status: DC | PRN
  Administered 2020-12-16: 0.5 mg via INTRAVENOUS

## 2020-12-16 MED ORDER — HYDROMORPHONE HCL 1 MG/ML IJ SOLN
1.0000 mg | INTRAMUSCULAR | Status: DC | PRN
  Administered 2020-12-16: 1 mg via INTRAVENOUS
  Filled 2020-12-16: qty 1

## 2020-12-16 MED ORDER — PHENYLEPHRINE 40 MCG/ML (10ML) SYRINGE FOR IV PUSH (FOR BLOOD PRESSURE SUPPORT)
PREFILLED_SYRINGE | INTRAVENOUS | Status: DC | PRN
  Administered 2020-12-16: 40 ug via INTRAVENOUS
  Administered 2020-12-16: 200 ug via INTRAVENOUS

## 2020-12-16 MED ORDER — MIDAZOLAM HCL 5 MG/5ML IJ SOLN
INTRAMUSCULAR | Status: DC | PRN
  Administered 2020-12-16: 1 mg via INTRAVENOUS

## 2020-12-16 MED ORDER — DEXAMETHASONE SODIUM PHOSPHATE 10 MG/ML IJ SOLN: INTRAMUSCULAR | Status: DC | PRN

## 2020-12-16 MED ORDER — TECHNETIUM TC 99M TILMANOCEPT KIT
1.0000 | PACK | Freq: Once | INTRAVENOUS | Status: AC | PRN
  Administered 2020-12-16: 1 via INTRADERMAL

## 2020-12-16 MED ORDER — HYDROMORPHONE HCL 1 MG/ML IJ SOLN
INTRAMUSCULAR | Status: AC
  Filled 2020-12-16: qty 1

## 2020-12-16 MED ORDER — KCL IN DEXTROSE-NACL 20-5-0.45 MEQ/L-%-% IV SOLN
INTRAVENOUS | Status: DC
  Filled 2020-12-16: qty 1000

## 2020-12-16 MED ORDER — ROCURONIUM BROMIDE 10 MG/ML (PF) SYRINGE
PREFILLED_SYRINGE | INTRAVENOUS | Status: AC
  Filled 2020-12-16: qty 10

## 2020-12-16 MED ORDER — CEFAZOLIN SODIUM-DEXTROSE 2-4 GM/100ML-% IV SOLN
2.0000 g | Freq: Three times a day (TID) | INTRAVENOUS | Status: DC
  Administered 2020-12-16 – 2020-12-17 (×2): 2 g via INTRAVENOUS
  Filled 2020-12-16 (×4): qty 100

## 2020-12-16 MED ORDER — FENTANYL CITRATE (PF) 250 MCG/5ML IJ SOLN
INTRAMUSCULAR | Status: DC | PRN
  Administered 2020-12-16 (×3): 50 ug via INTRAVENOUS

## 2020-12-16 MED ORDER — SENNA 8.6 MG PO TABS
1.0000 | ORAL_TABLET | Freq: Two times a day (BID) | ORAL | Status: DC
  Administered 2020-12-16 – 2020-12-17 (×2): 8.6 mg via ORAL
  Filled 2020-12-16 (×2): qty 1

## 2020-12-16 MED ORDER — SODIUM CHLORIDE (PF) 0.9 % IJ SOLN
INTRAVENOUS | Status: DC | PRN
  Administered 2020-12-16: 4 mL

## 2020-12-16 MED ORDER — PROPOFOL 10 MG/ML IV BOLUS
INTRAVENOUS | Status: AC
  Filled 2020-12-16: qty 40

## 2020-12-16 MED ORDER — DIPHENHYDRAMINE HCL 12.5 MG/5ML PO ELIX: 12.5000 mg | ORAL_SOLUTION | Freq: Four times a day (QID) | ORAL | Status: DC | PRN

## 2020-12-16 MED ORDER — POLYETHYLENE GLYCOL 3350 17 G PO PACK: 17.0000 g | PACK | Freq: Every day | ORAL | Status: DC | PRN

## 2020-12-16 MED ORDER — CHLORHEXIDINE GLUCONATE 0.12 % MT SOLN
OROMUCOSAL | Status: AC
  Administered 2020-12-16: 15 mL via OROMUCOSAL
  Filled 2020-12-16: qty 15

## 2020-12-16 MED ORDER — CEFAZOLIN SODIUM-DEXTROSE 2-4 GM/100ML-% IV SOLN
2.0000 g | INTRAVENOUS | Status: AC
  Administered 2020-12-16 (×2): 2 g via INTRAVENOUS

## 2020-12-16 MED ORDER — MIDAZOLAM HCL 2 MG/2ML IJ SOLN
2.0000 mg | Freq: Once | INTRAMUSCULAR | Status: AC
  Filled 2020-12-16: qty 2

## 2020-12-16 MED ORDER — CEFAZOLIN SODIUM-DEXTROSE 2-4 GM/100ML-% IV SOLN
INTRAVENOUS | Status: AC
  Filled 2020-12-16: qty 100

## 2020-12-16 SURGICAL SUPPLY — 81 items
ADH SKN CLS APL DERMABOND .7 (GAUZE/BANDAGES/DRESSINGS) ×4
APL PRP STRL LF DISP 70% ISPRP (MISCELLANEOUS) ×8
APPLIER CLIP 9.375 MED OPEN (MISCELLANEOUS) ×5
APR CLP MED 9.3 20 MLT OPN (MISCELLANEOUS) ×4
BAG COUNTER SPONGE SURGICOUNT (BAG) ×10 IMPLANT
BAG DECANTER FOR FLEXI CONT (MISCELLANEOUS) ×5 IMPLANT
BAG SPNG CNTER NS LX DISP (BAG) ×8
BINDER BREAST LRG (GAUZE/BANDAGES/DRESSINGS) IMPLANT
BINDER BREAST XLRG (GAUZE/BANDAGES/DRESSINGS) ×1 IMPLANT
BIOPATCH RED 1 DISK 7.0 (GAUZE/BANDAGES/DRESSINGS) ×20 IMPLANT
CANISTER SUCT 3000ML PPV (MISCELLANEOUS) ×10 IMPLANT
CHLORAPREP W/TINT 10.5 ML (MISCELLANEOUS) ×5 IMPLANT
CHLORAPREP W/TINT 26 (MISCELLANEOUS) ×10 IMPLANT
CLIP APPLIE 9.375 MED OPEN (MISCELLANEOUS) ×4 IMPLANT
COVER SURGICAL LIGHT HANDLE (MISCELLANEOUS) ×9 IMPLANT
DERMABOND ADVANCED (GAUZE/BANDAGES/DRESSINGS) ×1
DERMABOND ADVANCED .7 DNX12 (GAUZE/BANDAGES/DRESSINGS) ×8 IMPLANT
DEVICE DSSCT PLSMBLD 3.0S LGHT (MISCELLANEOUS) IMPLANT
DRAIN CHANNEL 19F RND (DRAIN) ×9 IMPLANT
DRAPE HALF SHEET 40X57 (DRAPES) ×5 IMPLANT
DRAPE LAPAROSCOPIC ABDOMINAL (DRAPES) ×4 IMPLANT
DRAPE LAPAROTOMY 100X72 PEDS (DRAPES) ×4 IMPLANT
DRAPE ORTHO SPLIT 77X108 STRL (DRAPES) ×10
DRAPE SURG 17X23 STRL (DRAPES) ×20 IMPLANT
DRAPE SURG ORHT 6 SPLT 77X108 (DRAPES) ×8 IMPLANT
DRAPE WARM FLUID 44X44 (DRAPES) ×5 IMPLANT
DRSG OPSITE POSTOP 4X10 (GAUZE/BANDAGES/DRESSINGS) ×2 IMPLANT
DRSG PAD ABDOMINAL 8X10 ST (GAUZE/BANDAGES/DRESSINGS) ×20 IMPLANT
ELECT BLADE 4.0 EZ CLEAN MEGAD (MISCELLANEOUS)
ELECT CAUTERY BLADE 6.4 (BLADE) ×5 IMPLANT
ELECT COATED BLADE 2.86 ST (ELECTRODE) ×5 IMPLANT
ELECT REM PT RETURN 9FT ADLT (ELECTROSURGICAL) ×10
ELECTRODE BLDE 4.0 EZ CLN MEGD (MISCELLANEOUS) IMPLANT
ELECTRODE REM PT RTRN 9FT ADLT (ELECTROSURGICAL) ×8 IMPLANT
EVACUATOR SILICONE 100CC (DRAIN) ×9 IMPLANT
FILTER IN LINE W/DETACHED HOSE (FILTER) ×5 IMPLANT
GAUZE 4X4 16PLY ~~LOC~~+RFID DBL (SPONGE) ×4 IMPLANT
GAUZE SPONGE 4X4 12PLY STRL (GAUZE/BANDAGES/DRESSINGS) ×8 IMPLANT
GLOVE SURG ENC MOIS LTX SZ6.5 (GLOVE) ×10 IMPLANT
GLOVE SURG ENC MOIS LTX SZ7.5 (GLOVE) ×5 IMPLANT
GOWN STRL REUS W/ TWL LRG LVL3 (GOWN DISPOSABLE) ×16 IMPLANT
GOWN STRL REUS W/TWL LRG LVL3 (GOWN DISPOSABLE) ×20
GRAFT FLEX HD 6X16 PLIABLE (Tissue) ×2 IMPLANT
IMPL EXPANDER BREAST 535CC (Breast) IMPLANT
IMPLANT BREAST 535CC (Breast) ×2 IMPLANT
IMPLANT EXPANDER BREAST 535CC (Breast) ×8 IMPLANT
KIT BASIN OR (CUSTOM PROCEDURE TRAY) ×10 IMPLANT
KIT FILL ASEPTIC TRANSFER (MISCELLANEOUS) ×1 IMPLANT
KIT TURNOVER KIT B (KITS) ×10 IMPLANT
LIGHT WAVEGUIDE WIDE FLAT (MISCELLANEOUS) IMPLANT
NDL 21 GA WING INFUSION (NEEDLE) IMPLANT
NDL HYPO 25GX1X1/2 BEV (NEEDLE) ×4 IMPLANT
NEEDLE 21 GA WING INFUSION (NEEDLE) ×5 IMPLANT
NEEDLE HYPO 25GX1X1/2 BEV (NEEDLE) ×5 IMPLANT
NS IRRIG 1000ML POUR BTL (IV SOLUTION) ×15 IMPLANT
PACK GENERAL/GYN (CUSTOM PROCEDURE TRAY) ×10 IMPLANT
PAD ARMBOARD 7.5X6 YLW CONV (MISCELLANEOUS) ×15 IMPLANT
PENCIL SMOKE EVACUATOR (MISCELLANEOUS) ×5 IMPLANT
PIN SAFETY STERILE (MISCELLANEOUS) ×5 IMPLANT
PLASMABLADE 3.0S W/LIGHT (MISCELLANEOUS) ×5
SET ASEPTIC TRANSFER (MISCELLANEOUS) IMPLANT
SPECIMEN JAR X LARGE (MISCELLANEOUS) ×5 IMPLANT
STRIP CLOSURE SKIN 1/2X4 (GAUZE/BANDAGES/DRESSINGS) ×2 IMPLANT
SUT ETHILON 3 0 FSL (SUTURE) ×4 IMPLANT
SUT MNCRL AB 4-0 PS2 18 (SUTURE) ×15 IMPLANT
SUT MON AB 3-0 SH 27 (SUTURE) ×10
SUT MON AB 3-0 SH27 (SUTURE) ×8 IMPLANT
SUT MON AB 4-0 PC3 18 (SUTURE) ×5 IMPLANT
SUT MON AB 5-0 PS2 18 (SUTURE) ×10 IMPLANT
SUT PDS AB 2-0 CT1 27 (SUTURE) ×22 IMPLANT
SUT SILK 4 0 PS 2 (SUTURE) ×6 IMPLANT
SUT VIC AB 3-0 54X BRD REEL (SUTURE) IMPLANT
SUT VIC AB 3-0 BRD 54 (SUTURE)
SUT VIC AB 3-0 SH 18 (SUTURE) ×5 IMPLANT
SUT VIC AB 3-0 SH 27 (SUTURE) ×15
SUT VIC AB 3-0 SH 27X BRD (SUTURE) ×4 IMPLANT
SYR CONTROL 10ML LL (SYRINGE) ×5 IMPLANT
TOWEL GREEN STERILE (TOWEL DISPOSABLE) ×10 IMPLANT
TOWEL GREEN STERILE FF (TOWEL DISPOSABLE) ×10 IMPLANT
TRAY FOLEY MTR SLVR 14FR STAT (SET/KITS/TRAYS/PACK) ×1 IMPLANT
TUBE CONNECTING 12X1/4 (SUCTIONS) IMPLANT

## 2020-12-16 NOTE — Anesthesia Preprocedure Evaluation (Addendum)
Anesthesia Evaluation  Patient identified by MRN, date of birth, ID band Patient awake    Reviewed: Allergy & Precautions, NPO status , Patient's Chart, lab work & pertinent test results  Airway Mallampati: II  TM Distance: >3 FB Neck ROM: Full    Dental  (+) Edentulous Upper, Edentulous Lower, Dental Advisory Given   Pulmonary neg pulmonary ROS, Current Smoker and Patient abstained from smoking.,    Pulmonary exam normal breath sounds clear to auscultation       Cardiovascular hypertension, Pt. on medications Normal cardiovascular exam Rhythm:Regular Rate:Normal     Neuro/Psych PSYCHIATRIC DISORDERS Anxiety negative neurological ROS     GI/Hepatic negative GI ROS, Neg liver ROS,   Endo/Other  negative endocrine ROS  Renal/GU negative Renal ROS  negative genitourinary   Musculoskeletal negative musculoskeletal ROS (+)   Abdominal   Peds  Hematology negative hematology ROS (+)   Anesthesia Other Findings Left breast CA  Reproductive/Obstetrics                            Anesthesia Physical Anesthesia Plan  ASA: 2  Anesthesia Plan: General   Post-op Pain Management:    Induction: Intravenous  PONV Risk Score and Plan: 2 and Ondansetron, Dexamethasone and Midazolam  Airway Management Planned: Oral ETT  Additional Equipment:   Intra-op Plan:   Post-operative Plan: Extubation in OR  Informed Consent: I have reviewed the patients History and Physical, chart, labs and discussed the procedure including the risks, benefits and alternatives for the proposed anesthesia with the patient or authorized representative who has indicated his/her understanding and acceptance.     Dental advisory given  Plan Discussed with: CRNA  Anesthesia Plan Comments: (2 IVs)       Anesthesia Quick Evaluation

## 2020-12-16 NOTE — Interval H&P Note (Signed)
History and Physical Interval Note:  12/16/2020 1:29 PM  Sharon Fischer  has presented today for surgery, with the diagnosis of RIGHT BREAST CANCER, LEFT BREAST PAPILLOMA AND CSL.  The various methods of treatment have been discussed with the patient and family. After consideration of risks, benefits and other options for treatment, the patient has consented to  Procedure(s): TOTAL MASTECTOMY (Bilateral) TARGETED RIGHT SENTINEL LYMPH NODE DISSECTION (Right) REMOVAL PORT-A-CATH (Right) IMMEDIATE BILATERAL BREAST RECONSTRUCTION WITH PLACEMENT OF TISSUE EXPANDER AND FLEX HD (ACELLULAR HYDRATED DERMIS) (Bilateral) CYST EXCISION LEFT AXILLA (Left) as a surgical intervention.  The patient's history has been reviewed, patient examined, no change in status, stable for surgery.  I have reviewed the patient's chart and labs.  Questions were answered to the patient's satisfaction.     Loel Lofty Sharon Fischer

## 2020-12-16 NOTE — Interval H&P Note (Signed)
History and Physical Interval Note:  12/16/2020 8:15 AM  Sharon Fischer  has presented today for surgery, with the diagnosis of RIGHT BREAST CANCER, LEFT BREAST PAPILLOMA AND CSL.  The various methods of treatment have been discussed with the patient and family. After consideration of risks, benefits and other options for treatment, the patient has consented to  Procedure(s): RIGHT MASTECTOMY (Bilateral) RADIOACTIVE SEED GUIDED RIGHT targeted LYMPH NODE DISSECTION (Right) SENTINEL NODE BIOPSY (Right) REMOVAL PORT-A-CATH (N/A) LEFT MASTECTOMY IMMEDIATE BILATERAL BREAST RECONSTRUCTION WITH PLACEMENT OF TISSUE EXPANDER AND FLEX HD (ACELLULAR HYDRATED DERMIS) (Bilateral) as a surgical intervention.  The patient's history has been reviewed, patient examined, no change in status, stable for surgery.  I have reviewed the patient's chart and labs.  Questions were answered to the patient's satisfaction.     [name]

## 2020-12-16 NOTE — H&P (Signed)
Sharon Fischer  Location: [facility] Patient #: [id number] DOB: [birth year] Single / Language: Cleophus Molt / Race: Black or African American Female   History of Present Illness  The patient is a 55 year old female who presents for a follow-up for Breast cancer. The patient is a 55 year old black female who was seen several months ago with 2 areas of cancer in the upper inner quadrant and central right breast covering an area of approach by 7 cm.  She had 2 positive lymph nodes.  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  She has undergone neoadjuvant chemotherapy and seems to have had a good response.  She no longer has any palpable mass in the breast.  She has been unable to get a follow-up MRI because of nausea.  She has thought a lot about what she would like to have none and at this point is favoring bilateral mastectomies.  She would be interested in reconstructive options.   Allergies  No Known Drug Allergies     Medication History  Atorvastatin Calcium  (40MG  Tablet, Oral) Active. Cetirizine HCl  (10MG  Tablet, Oral) Active. Lisinopril-hydroCHLOROthiazide  (20-12.5MG  Tablet, Oral) Active. Flonase  (50MCG/ACT Suspension, Nasal) Active. Medications Reconciled     Review of Systems General Not Present- Appetite Loss, Chills, Fatigue, Fever, Night Sweats, Weight Gain and Weight Loss. Skin Not Present- Change in Wart/Mole, Dryness, Hives, Jaundice, New Lesions, Non-Healing Wounds, Rash and Ulcer. HEENT Present- Seasonal Allergies. Not Present- Earache, Hearing Loss, Hoarseness, Nose Bleed, Oral Ulcers, Ringing in the Ears, Sinus Pain, Sore Throat, Visual Disturbances, Wears glasses/contact lenses and Yellow Eyes. Respiratory Not Present- Bloody sputum, Chronic Cough, Difficulty Breathing, Snoring and Wheezing. Breast Present- Breast Mass. Not Present- Breast Pain, Nipple Discharge and Skin  Changes. Cardiovascular Not Present- Chest Pain, Difficulty Breathing Lying Down, Leg Cramps, Palpitations, Rapid Heart Rate, Shortness of Breath and Swelling of Extremities. Gastrointestinal Not Present- Abdominal Pain, Bloating, Bloody Stool, Change in Bowel Habits, Chronic diarrhea, Constipation, Difficulty Swallowing, Excessive gas, Gets full quickly at meals, Hemorrhoids, Indigestion, Nausea, Rectal Pain and Vomiting. Female Genitourinary Not Present- Frequency, Nocturia, Painful Urination, Pelvic Pain and Urgency. Musculoskeletal Present- Back Pain. Not Present- Joint Pain, Joint Stiffness, Muscle Pain, Muscle Weakness and Swelling of Extremities. Neurological Present- Headaches. Not Present- Decreased Memory, Fainting, Numbness, Seizures, Tingling, Tremor, Trouble walking and Weakness. Psychiatric Not Present- Anxiety, Bipolar, Change in Sleep Pattern, Depression, Fearful and Frequent crying. Endocrine Not Present- Cold Intolerance, Excessive Hunger, Hair Changes, Heat Intolerance, Hot flashes and New Diabetes. Hematology Present- Easy Bruising. Not Present- Blood Thinners, Excessive bleeding, Gland problems, HIV and Persistent Infections.   Physical Exam  General Mental Status - Alert. General Appearance - Consistent with stated age. Hydration - Well hydrated. Voice - Normal.  Head and Neck Head - normocephalic, atraumatic with no lesions or palpable masses. Trachea - midline. Thyroid Gland Characteristics - normal size and consistency.  Eye Eyeball - Bilateral - Extraocular movements intact. Sclera/Conjunctiva - Bilateral - No scleral icterus.  Chest and Lung Exam Chest and lung exam reveals  - quiet, even and easy respiratory effort with no use of accessory muscles and on auscultation, normal breath sounds, no adventitious sounds and normal vocal resonance. Inspection Chest Wall - Normal. Back - normal.  Breast Note:  There is no palpable mass in either breast. There is no  palpable axillary, supraclavicular, or cervical lymphadenopathy. There is a large sebaceous cyst in  the skin of the left axilla   Cardiovascular Cardiovascular examination reveals  - normal heart sounds, regular rate and rhythm with no murmurs and normal pedal pulses bilaterally.  Abdomen Inspection Inspection of the abdomen reveals - No Hernias. Skin - Scar - no surgical scars. Palpation/Percussion Palpation and Percussion of the abdomen reveal - Soft, Non Tender, No Rebound tenderness, No Rigidity (guarding) and No hepatosplenomegaly. Auscultation Auscultation of the abdomen reveals - Bowel sounds normal.  Neurologic Neurologic evaluation reveals  - alert and oriented x 3 with no impairment of recent or remote memory. Mental Status - Normal.  Musculoskeletal Normal Exam - Left - Upper Extremity Strength Normal and Lower Extremity Strength Normal. Normal Exam - Right - Upper Extremity Strength Normal and Lower Extremity Strength Normal.  Lymphatic Head & Neck  General Head & Neck Lymphatics: Bilateral - Description - Normal. Axillary  General Axillary Region: Bilateral - Description - Normal. Tenderness - Non Tender. Femoral & Inguinal  Generalized Femoral & Inguinal Lymphatics: Bilateral - Description - Normal. Tenderness - Non Tender.    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 She has undergone neoadjuvant chemotherapy and seems to have had a good response. She was unable to get a follow-up MRI because of nausea. At this point she is electing for bilateral mastectomies. Because of the response she would be a good candidate for targeted node dissection along with sentinel node biopsy. I discussed with her in detail the risks and benefits of the operation as well as  some of the technical aspects and she understands and wishes to proceed. We will refer her to plastic surgery to discuss options for reconstruction and then proceed accordingly if needed. She would also like to have a sebaceous cyst in the left axilla removed at the time of surgery as well as possibly having her port removed and I think that would be reasonable. This patient encounter took 20 minutes today to perform the following: take history, perform exam, review outside records, interpret imaging, counsel the patient on their diagnosis and document encounter, findings & plan in the EHR Current Plans Referred to Surgery - Plastic, for evaluation and follow up (Plastic Surgery). Routine.

## 2020-12-16 NOTE — Transfer of Care (Signed)
Immediate Anesthesia Transfer of Care Note  Patient: Sharon Fischer  Procedure(s) Performed: TOTAL MASTECTOMY (Bilateral: Breast) TARGETED RIGHT SENTINEL LYMPH NODE DISSECTION (Right: Axilla) REMOVAL PORT-A-CATH (Right: Breast) CYST EXCISION LEFT AXILLA (Left: Axilla) IMMEDIATE BILATERAL BREAST RECONSTRUCTION WITH PLACEMENT OF TISSUE EXPANDER AND FLEX HD (ACELLULAR HYDRATED DERMIS) (Bilateral: Breast)  Patient Location: PACU  Anesthesia Type:General  Level of Consciousness: awake and alert   Airway & Oxygen Therapy: Patient Spontanous Breathing  Post-op Assessment: Report given to RN, Post -op Vital signs reviewed and stable and Patient moving all extremities  Post vital signs: Reviewed and stable  Last Vitals:  Vitals Value Taken Time  BP    Temp    Pulse    Resp    SpO2      Last Pain:  Vitals:   12/16/20 0759  TempSrc:   PainSc: 0-No pain         Complications: No notable events documented.

## 2020-12-16 NOTE — Op Note (Addendum)
12/16/2020  11:59 AM  PATIENT:  Sharon Fischer  55 y.o. female  PRE-OPERATIVE DIAGNOSIS:  RIGHT BREAST CANCER, LEFT BREAST PAPILLOMA AND CSL  POST-OPERATIVE DIAGNOSIS:  RIGHT BREAST CANCER, LEFT BREAST PAPILLOMA AND CSL  PROCEDURE:  Procedure(s): RIGHT MASTECTOMY WITH DEEP RIGHT AXILLARY SENTINEL LYMPH NODE MAPPING AND INJECTION BLUE DYE REMOVAL PORT-A-CATH (Right) LEFT SIMPLE MASTECTOMY CYST EXCISION LEFT AXILLA (Left)  SURGEON:  Surgeon(s) and Role:  Jovita Kussmaul, MD - Primary   PHYSICIAN ASSISTANT:   ASSISTANTS: Pryor Curia, RNFA   ANESTHESIA:   general  EBL:  minimal   BLOOD ADMINISTERED:none  DRAINS: none   LOCAL MEDICATIONS USED:  MARCAINE     SPECIMEN:  Source of Specimen:  right mastectomy with sentinel nodes x 3, left mastectomy  DISPOSITION OF SPECIMEN:  PATHOLOGY  COUNTS:  YES  TOURNIQUET:  * No tourniquets in log *  DICTATION: .Dragon Dictation  After informed consent was obtained the patient was brought to the operating room and placed in the supine position on the operating table.  After adequate induction of general anesthesia the patient's bilateral chest, breast, and axillary area were prepped with ChloraPrep, allowed to dry, and draped in usual sterile manner.  An appropriate timeout was performed.  Earlier in the day the patient underwent injection of 1 mCi of technetium sulfur colloid in the subareolar position on the right.  Yesterday an attempt was made to place a seed in the previously biopsied positive lymph node but the mark could not be seen so no seed was placed.  Because the patient had neoadjuvant chemotherapy at this point we also injected 4 cc of methylene blue and 1 cc of saline in the subareolar position on the right.  Attention was first turned to the left breast.  An elliptical incision was made around the nipple and areolar complex with a 10 blade knife in order to spare much of the skin.  The incision was carried through the skin  and subcutaneous tissue sharply with the PlasmaBlade.  Breast hooks were used to elevate the skin flaps anteriorly towards the ceiling.  Thin skin flaps were then created by dissecting between the breast tissue and the subcutaneous fat and skin.  This dissection was carried circumferentially all the way to the chest wall.  Next the breast was removed from the pectoralis muscle with the pectoralis fascia sharply with the PlasmaBlade.  Once this was accomplished the breast was marked with a stitch on the lateral skin and sent to pathology for further evaluation.  Hemostasis was achieved using the PlasmaBlade.  The wound was packed with a moistened lap sponge.  Attention was then turned to the left axilla.  The patient had a large blackhead in the skin of the left axilla.  This was excised sharply with a 15 blade knife around the lesion completely so that none of the contents of the cyst was spilled.  This lesion measured about 2cm in diameter. This lesion was also sent to pathology for further evaluation.  The incision was then closed with interrupted 4-0 Monocryl subcuticular stitches.  Attention was then turned to the right breast.  A similar elliptical incision was made with a 10 blade knife around the nipple and areola complex in order to spare much of the skin.  The incision was carried through the skin and subcutaneous tissue sharply with the PlasmaBlade.  Breast hooks were used to elevate the skin flaps anteriorly towards the ceiling.  Thin skin flaps were then created by dissection  between the breast tissue and the subcutaneous fat and skin.  This dissection was carried circumferentially all the way to the chest wall.  Next the breast was removed from the pectoralis muscle with the pectoralis fascia sharply with the PlasmaBlade.  Once this was accomplished the entire right breast was removed.  It was marked with a stitch on the lateral skin and sent to pathology for further evaluation.  Hemostasis was achieved  using the PlasmaBlade.  The neoprobe was then set to technetium and there was some radioactivity but very weak in the right axilla.  The deep right axillary space was entered sharply with the PlasmaBlade.  High in the right axilla near the right axillary vein I was able to identify 3 lymph nodes that were palpable.  Each of these was excised sharply with the PlasmaBlade and the surrounding small vessels and lymphatics were controlled with clips.  Ex vivo counts on these nodes were approximately 100 counts.  No other hot or palpable nodes were identified in the right axilla.  During the dissection we did identify the port on the right chest wall.  At the superior dissection area of the breast we open the port capsule.  The port was then removed without difficulty and pressure was held for several minutes until the area was completely hemostatic.  I then closed the tract from the port tubing with a figure-of-eight 3-0 Vicryl stitch.  The right chest wall wound was then packed with a moistened lap sponge.  The patient tolerated the procedure well.  At the end of this portion of the case all needle sponge and instrument counts were correct.  The operation was then turned over to Dr. Marla Roe for the reconstruction.  Her portion will be dictated separately.  PLAN OF CARE: Admit for overnight observation  PATIENT DISPOSITION:  PACU - hemodynamically stable.   Delay start of Pharmacological VTE agent (>24hrs) due to surgical blood loss or risk of bleeding: no

## 2020-12-16 NOTE — Anesthesia Procedure Notes (Signed)
Procedure Name: Intubation Date/Time: 12/16/2020 9:38 AM Performed by: Kyung Rudd, CRNA Pre-anesthesia Checklist: Patient identified, Emergency Drugs available, Suction available and Patient being monitored Patient Re-evaluated:Patient Re-evaluated prior to induction Oxygen Delivery Method: Circle System Utilized Preoxygenation: Pre-oxygenation with 100% oxygen Induction Type: IV induction Ventilation: Mask ventilation without difficulty Laryngoscope Size: 4 and Mac Grade View: Grade I Tube type: Oral Tube size: 7.0 mm Number of attempts: 1 Airway Equipment and Method: Stylet Placement Confirmation: ETT inserted through vocal cords under direct vision, positive ETCO2 and breath sounds checked- equal and bilateral Secured at: 21 cm Tube secured with: Tape Dental Injury: Teeth and Oropharynx as per pre-operative assessment  Comments: Performed by Heide Scales, SRNA under direct supervision by Dr. Lanetta Inch and Rhae Lerner, CRNA

## 2020-12-16 NOTE — Discharge Instructions (Signed)
INSTRUCTIONS FOR AFTER SURGERY   You will likely have some questions about what to expect following your operation.  The following information will help you and your family understand what to expect when you are discharged from the hospital.  Following these guidelines will help ensure a smooth recovery and reduce risks of complications.  Postoperative instructions include information on: diet, wound care, medications and physical activity.  AFTER SURGERY Expect to go home after the procedure.  In some cases, you may need to spend one night in the hospital for observation.  DIET This surgery does not require a specific diet.  However, I have to mention that the healthier you eat the better your body can start healing. It is important to increasing your protein intake.  This means limiting the foods with added sugar.  Focus on fruits and vegetables and some meat. It is very important to drink water after your surgery.  If your urine is bright yellow, then it is concentrated, and you need to drink more water.  As a general rule after surgery, you should have 8 ounces of water every hour while awake.  If you find you are persistently nauseated or unable to take in liquids let us know.  NO TOBACCO USE or EXPOSURE.  This will slow your healing process and increase the risk of a wound.  WOUND CARE If you have a drain: Clean with baby wipes for 3-5 days and then you can shower.  If you have a binder you may remove it to shower and then put it back on. If you have steri-strips / tape directly attached to your skin leave them in place. It is OK to get these wet.  No baths, pools or hot tubs for two weeks. We close your incision to leave the smallest and best-looking scar. No ointment or creams on your incisions until given the go ahead.  Especially not Neosporin (Too many skin reactions with this one).  A few weeks after surgery you can use Mederma and start massaging the scar. We ask you to wear your binder or  sports bra for the first 6 weeks around the clock, including while sleeping. This provides added comfort and helps reduce the fluid accumulation at the surgery site.  ACTIVITY No heavy lifting until cleared by the doctor.  It is OK to walk and climb stairs. In fact, moving your legs is very important to decrease your risk of a blood clot.  It will also help keep you from getting deconditioned.  Every 1 to 2 hours get up and walk for 5 minutes. This will help with a quicker recovery back to normal.  Let pain be your guide so you don't do too much.  NO, you cannot do the spring cleaning and don't plan on taking care of anyone else.  This is your time for TLC.   WORK Everyone returns to work at different times. As a rough guide, most people take at least 1 - 2 weeks off prior to returning to work. If you need documentation for your job, bring the forms to your postoperative follow up visit.  DRIVING Arrange for someone to bring you home from the hospital.  You may be able to drive a few days after surgery but not while taking any narcotics or valium.  BOWEL MOVEMENTS Constipation can occur after anesthesia and while taking pain medication.  It is important to stay ahead for your comfort.  We recommend taking Milk of Magnesia (2 tablespoons; twice   a day) while taking the pain pills.  SEROMA This is fluid your body tried to put in the surgical site.  This is normal but if it creates excessive pain and swelling let us know.  It usually decreases in a few weeks.  MEDICATIONS and PAIN CONTROL At your preoperative visit for you history and physical you were given the following medications: An antibiotic: Start this medication when you get home and take according to the instructions on the bottle. Zofran 4 mg:  This is to treat nausea and vomiting.  You can take this every 6 hours as needed and only if needed. Norco (hydrocodone/acetaminophen) 5/325 mg:  This is only to be used after you have taken the  motrin or the tylenol. Every 8 hours as needed. Over the counter Medication to take: Ibuprofen (Motrin) 600 mg:  Take this every 6 hours.  If you have additional pain then take 500 mg of the tylenol.  Only take the Norco after you have tried these two. Miralax or stool softener of choice: Take this according to the bottle if you take the Norco.  WHEN TO CALL Call your surgeon's office if any of the following occur:  Fever 101 degrees F or greater  Excessive bleeding or fluid from the incision site.  Pain that increases over time without aid from the medications  Redness, warmth, or pus draining from incision sites  Persistent nausea or inability to take in liquids  Severe misshapen area that underwent the operation.  [facility]  What is the benefit of having a drain?  During surgery your tissue layers are separated.  This raw surface stimulates your body to fill the space with serous fluid.  This is normal but you don't want that fluid to collect and prevent healing.  A fluid collection can also become infected.  The Jackson-Pratt (JP) drain is used to eliminate this collection of fluid and allow the tissue to heal together.    Jackson-Pratt (JP) bulb    How to care for your drainage and suction unit at home Your drainage catheter will be connected to a collection device. The vacuum caused when the device is compressed allows drainage to collect in the device.    Wash your hands with soap and water before and after touching the system. Empty the JP drain every 12 hours once you get home from your procedure. Record the fluid amount on the record sheet included. Start with stripping the drain tube to push the clots or excess fluid to the bulb.  Do this by pinching the tube with one hand near your skin.  Then with the other hand squeeze the tubing and work it toward the bulb.  This should be done several times a day.  This may collapse the tube which will correct on its  own.   Use a safety pin to attach your collection device to your clothing so there is no tension on the insertion site.   If you have drainage at the skin insertion site, you can apply a gauze dressing and secure it with tape. If the drain falls out, apply a gauze dressing over the drain insertion site and secure with tape.   To empty the collection device:   Release the stopper on the top of the collection unit (bulb).  Pour contents into a measuring container such as a plastic medicine cup.  Record the day and amount of drainage on the attached sheet. This should be done at least   twice a day.    To compress the Jackson-Pratt Bulb:  Release the stopper at the top of the bulb. Squeeze the bulb tightly in your fist, squeezing air out of the bulb.  Replace the stopper while the bulb is compressed.  Be careful not to spill the contents when squeezing the bulb. The drainage will start bright red and turn to pink and then yellow with time. IMPORTANT: If the bulb is not squeezed before adding the stopper it will not draw out the fluid.  Care for the JP drain site and your skin daily:  You may shower three days after surgery. Secure the drain to a ribbon or cloth around your waist while showering so it does not pull out while showering. Be sure your hands are cleaned with soap and water. Use a clean wet cotton swab to clean the skin around the drain site.  Use another cotton swab to place Vaseline or antibiotic ointment on the skin around the drain.     Contact your physician if any of the following occur:  The fluid in the bulb becomes cloudy. Your temperature is greater than 101.4.  The incision opens. If you have drainage at the skin insertion site, you can apply a gauze dressing and secure it with tape. If the drain falls out, apply a gauze dressing over the drain insertion site and secure with tape.  You will usually have more drainage when you are active than while you rest or are  asleep. If the drainage increases significantly or is bloody call the physician                             Bring this record with you to each office visit Date  Drainage Volume  Date   Drainage volume                                                                                                                                                                                          

## 2020-12-16 NOTE — Op Note (Signed)
Op report    DATE OF OPERATION:  12/16/2020  LOCATION: [facility]  SURGICAL DIVISION: Plastic Surgery  PREOPERATIVE DIAGNOSES:  1. Breast cancer.    POSTOPERATIVE DIAGNOSES:  1. Breast cancer.   PROCEDURE:  1. Bilateral immediate breast reconstruction with placement of Acellular Dermal Matrix and tissue expanders.  SURGEON: Amiliah Campisi Sanger Ronney Honeywell, DO  ASSISTANT: Roetta Sessions, PA  ANESTHESIA:  General.   COMPLICATIONS: None.   IMPLANTS: Left - Mentor 535 cc. Ref #[id number], 150 cc of injectable saline placed in the expander. Right - Mentor 535 cc. Ref #[id number], 150 cc of injectable saline placed in the expander. Acellular Dermal Matrix 6 x 16 cm two Flex HD  INDICATIONS FOR PROCEDURE:  The patient, Sharon Fischer, is a 55 y.o. female born on 1966-01-11, is here for  immediate first stage breast reconstruction with placement of bilateral tissue expander and Acellular dermal matrix. MRN: [id number]  CONSENT:  Informed consent was obtained directly from the patient. Risks, benefits and alternatives were fully discussed. Specific risks including but not limited to bleeding, infection, hematoma, seroma, scarring, pain, implant infection, implant extrusion, capsular contracture, asymmetry, wound healing problems, and need for further surgery were all discussed. The patient did have an ample opportunity to have her questions answered to her satisfaction.   DESCRIPTION OF PROCEDURE:  The patient was taken to the operating room by the general surgery team. SCDs were placed and IV antibiotics were given. The patient's chest was prepped and draped in a sterile fashion. A time out was performed and the implants to be used were identified.  Bilateral mastectomies were performed.  Once the general surgery team had completed their portion of the case the patient was rendered to the plastic and reconstructive surgery team.  Left:  The pectoralis major muscle was lifted  from the chest wall with release of the lateral edge and lateral inframammary fold.  The pocket was irrigated with antibiotic solution and hemostasis was achieved with electrocautery.  The ADM was then prepared according to the manufacture guidelines and slits placed to help with postoperative fluid management.  The ADM was then sutured to the inferior and lateral edge of the inframammary fold with 2-0 PDS starting with an interrupted stitch and then a running stitch.  The lateral portion was sutured to with interrupted sutures after the expander was placed.  The expander was prepared according to the manufacture guidelines, the air evacuated and then it was placed under the ADM and pectoralis major muscle.  The inferior and lateral tabs were used to secure the expander to the chest wall with 2-0 PDS.  There was a 1 cm area of pectoralis muscle that split from the insertion.  This was patch with the ADM using 3-0 Vicryl. The drain was placed at the inframammary fold over the ADM and secured to the skin with 3-0 Silk.  The deep layers were closed with 3-0 Monocryl followed by 4-0 Monocryl.  The skin was closed with 5-0 Monocryl and then dermabond was applied.  Right:  The pectoralis major muscle was lifted from the chest wall with release of the lateral edge and lateral inframammary fold.  The pocket was irrigated with antibiotic solution and hemostasis was achieved with electrocautery.  The ADM was then prepared according to the manufacture guidelines and slits placed to help with postoperative fluid management.  The ADM was then sutured to the inferior and lateral edge of the inframammary fold with 2-0 PDS starting with an interrupted stitch and  then a running stitch.  The lateral portion was sutured to with interrupted sutures after the expander was placed.  The expander was prepared according to the manufacture guidelines, the air evacuated and then it was placed under the ADM and pectoralis major muscle.  The  inferior and lateral tabs were used to secure the expander to the chest wall with 2-0 PDS.  The drain was placed at the inframammary fold over the ADM and secured to the skin with 3-0 Silk.    The deep layers were closed with 3-0 Monocryl followed by 4-0 Monocryl.  The skin was closed with 5-0 Monocryl and then dermabond was applied.  The ABDs and breast binder were placed.  The patient tolerated the procedure well and there were no complications.  The patient had a long narrow chest wall with a sharp angle at the lateral aspect of the breast.  The patient was allowed to wake from anesthesia and taken to the recovery room in satisfactory condition.

## 2020-12-16 NOTE — Progress Notes (Signed)
Spoke to Dr. Lanetta Inch regarding low BP's.  Dr. Lanetta Inch said it was fine.

## 2020-12-16 NOTE — Anesthesia Procedure Notes (Signed)
Anesthesia Regional Block: Pectoralis block   Pre-Anesthetic Checklist: , timeout performed,  Correct Patient, Correct Site, Correct Laterality,  Correct Procedure, Correct Position, site marked,  Risks and benefits discussed,  Surgical consent,  Pre-op evaluation,  At surgeon's request and post-op pain management  Laterality: Right  Prep: Maximum Sterile Barrier Precautions used, chloraprep       Needles:  Injection technique: Single-shot  Needle Type: Echogenic Stimulator Needle     Needle Length: 9cm  Needle Gauge: 22     Additional Needles:   Procedures:,,,, ultrasound used (permanent image in chart),,    Narrative:  Start time: 12/16/2020 9:00 AM End time: 12/16/2020 9:10 AM Injection made incrementally with aspirations every 5 mL.  Performed by: Personally  Anesthesiologist: Freddrick March, MD  Additional Notes: Monitors applied. No increased pain on injection. No increased resistance to injection. Injection made in 5cc increments. Good needle visualization. Patient tolerated procedure well.

## 2020-12-17 ENCOUNTER — Encounter (HOSPITAL_COMMUNITY): Payer: Self-pay | Admitting: General Surgery

## 2020-12-17 DIAGNOSIS — C50211 Malignant neoplasm of upper-inner quadrant of right female breast: Secondary | ICD-10-CM | POA: Diagnosis not present

## 2020-12-17 NOTE — Progress Notes (Signed)
Sharon Fischer to be D/C'd  per MD order.  Discussed with the patient and all questions fully answered.  VSS, Skin clean, dry and intact without evidence of skin break down, no evidence of skin tears noted.  IV catheter discontinued intact. Site without signs and symptoms of complications. Dressing and pressure applied.  An After Visit Summary was printed and given to the patient. Patient received prescription.  D/c education completed with patient/family including follow up instructions, medication list, d/c activities limitations if indicated, with other d/c instructions as indicated by MD - patient able to verbalize understanding, all questions fully answered.   Patient instructed to return to ED, call 911, or call MD for any changes in condition.   Patient to be escorted via [place], and D/C home via private auto.

## 2020-12-17 NOTE — Discharge Summary (Signed)
Physician Discharge Summary  Patient ID: Sharon Fischer MRN: [id number] DOB/AGE: [birth year] 55 y.o.  Admit date: 12/16/2020 Discharge date: 12/17/2020  Admission Diagnoses:  Discharge Diagnoses:  Active Problems:   Breast cancer [facility])   Discharged Condition: good  Hospital Course: 55 year old female presented to the [facility] operating room for bilateral mastectomy, removal of Port-A-Cath and right sentinel lymph node biopsy by general surgery followed by bilateral immediate breast reconstruction with placement of tissue expanders.  Patient stayed overnight for observation.  Patient reports she is doing well.  She reports she is ready for discharge.  No fevers, chills, nausea, vomiting.  Positive flatus, no BM yet.  No complaints.  Consults: None  Significant Diagnostic Studies: none  Treatments: IV hydration and antibiotics: Ancef  Discharge Exam: Blood pressure 106/62, pulse 86, temperature 98.2 F (36.8 C), temperature source Oral, resp. rate 17, height 5\' 7"  (1.702 m), weight 67.6 kg, SpO2 99 %. General appearance: alert, cooperative, no distress, and resting in bed Resp: Unlabored Breasts: Bilateral mastectomy flaps with good color, no skin necrosis noted, incisions intact, honeycomb dressings in place with no drainage noted.  Bilateral JP drains in place with serosanguineous drainage.  No erythema or cellulitic changes noted.  No subcutaneous fluid collections noted with palpation Extremities: extremities normal, atraumatic, no cyanosis or edema   Disposition: Discharge disposition: 01-Home or Self Care       Discharge Instructions     Call MD for:  difficulty breathing, headache or visual disturbances   Complete by: As directed    Call MD for:  extreme fatigue   Complete by: As directed    Call MD for:  hives   Complete by: As directed    Call MD for:  persistant dizziness or light-headedness   Complete by: As directed    Call MD for:  persistant nausea and  vomiting   Complete by: As directed    Call MD for:  redness, tenderness, or signs of infection (pain, swelling, redness, odor or green/yellow discharge around incision site)   Complete by: As directed    Call MD for:  severe uncontrolled pain   Complete by: As directed    Call MD for:  temperature >100.4   Complete by: As directed    Diet - low sodium heart healthy   Complete by: As directed    Increase activity slowly   Complete by: As directed       Allergies as of 12/17/2020   No Known Allergies      Medication List     TAKE these medications    acetaminophen 325 MG tablet Commonly known as: TYLENOL Take 325 mg by mouth every 6 (six) hours as needed (FOR PAIN).   albuterol 108 (90 Base) MCG/ACT inhaler Commonly known as: VENTOLIN HFA Inhale 1-2 puffs into the lungs every 4 (four) hours as needed for wheezing or shortness of breath.   atorvastatin 40 MG tablet Commonly known as: LIPITOR Take 40 mg by mouth at bedtime.   cephALEXin 500 MG capsule Commonly known as: KEFLEX Take 500 mg by mouth 4 (four) times daily.   diazepam 2 MG tablet Commonly known as: Valium Take 1 tablet (2 mg total) by mouth every 12 (twelve) hours as needed for muscle spasms.   gabapentin 300 MG capsule Commonly known as: NEURONTIN Take 1 capsule (300 mg total) by mouth 2 (two) times daily.   hydrochlorothiazide 12.5 MG tablet Commonly known as: HYDRODIURIL Take 12.5 mg by mouth in the morning.  HYDROcodone-acetaminophen 5-325 MG tablet Commonly known as: NORCO/VICODIN Take 1 tablet by mouth every 6 (six) hours as needed for severe pain.   ondansetron 4 MG tablet Commonly known as: Zofran Take 1 tablet (4 mg total) by mouth every 8 (eight) hours as needed for nausea or vomiting.       ASK your doctor about these medications    dexamethasone 1 MG tablet Commonly known as: DECADRON Take 1 tablet (1 mg total) by mouth daily.   lidocaine-prilocaine cream Commonly known as:  EMLA Apply to affected area once   LORazepam 0.5 MG tablet Commonly known as: Ativan Take 1 tablet (0.5 mg total) by mouth at bedtime as needed for sleep.   ondansetron 8 MG tablet Commonly known as: Zofran Take 1 tablet (8 mg total) by mouth every 8 (eight) hours as needed. Start on the third day after chemotherapy.   oxybutynin 5 MG 24 hr tablet Commonly known as: DITROPAN-XL Take 1 tablet (5 mg total) by mouth at bedtime.   potassium chloride SA 20 MEQ tablet Commonly known as: KLOR-CON Take 1 tablet (20 mEq total) by mouth daily.   prochlorperazine 10 MG tablet Commonly known as: COMPAZINE Take 1 tablet (10 mg total) by mouth every 6 (six) hours as needed (Nausea or vomiting).        Follow-up Information     [name], MD Follow up in 2 week(s).   Specialty: General Surgery Contact information: [place] [address] [facility] [address] [place], [place] [address] [phone]  Signed: Carola Rhine Onia Shiflett 12/17/2020, 10:07 AM

## 2020-12-17 NOTE — Plan of Care (Signed)
  Problem: Education: Goal: Required Educational Video(s) Outcome: Progressing   Problem: Clinical Measurements: Goal: Ability to maintain clinical measurements within normal limits will improve Outcome: Progressing Goal: Postoperative complications will be avoided or minimized Outcome: Progressing   Problem: Skin Integrity: Goal: Demonstration of wound healing without infection will improve Outcome: Progressing   

## 2020-12-17 NOTE — Progress Notes (Signed)
1 Day Post-Op   Subjective/Chief Complaint: No complaints. Only mild discomfort   Objective: Vital signs in last 24 hours: Temp:  [97.3 F (36.3 C)-98.2 F (36.8 C)] 98.2 F (36.8 C) (07/07 0749) Pulse Rate:  [84-94] 86 (07/07 0749) Resp:  [7-21] 17 (07/07 0749) BP: (85-127)/(41-74) 106/62 (07/07 0749) SpO2:  [94 %-100 %] 99 % (07/07 0749) Last BM Date: 12/15/20  Intake/Output from previous day: 07/06 0701 - 07/07 0700 In: 3520.5 [P.O.:480; I.V.:2490.5; IV Piggyback:550] Out: 250 [Urine:80; Drains:140; Blood:30] Intake/Output this shift: Total I/O In: 120 [P.O.:120] Out: -   General appearance: alert and cooperative Resp: clear to auscultation bilaterally Chest wall: skin flaps look good Cardio: regular rate and rhythm GI: soft, non-tender; bowel sounds normal; no masses,  no organomegaly  Lab Results:  No results for input(s): WBC, HGB, HCT, PLT in the last 72 hours. BMET No results for input(s): NA, K, CL, CO2, GLUCOSE, BUN, CREATININE, CALCIUM in the last 72 hours. PT/INR No results for input(s): LABPROT, INR in the last 72 hours. ABG No results for input(s): PHART, HCO3 in the last 72 hours.  Invalid input(s): PCO2, PO2  Studies/Results: NM Sentinel Node Inj-No Rpt (Breast)  Result Date: 12/16/2020 Sulfur Colloid was injected by the Nuclear Medicine Technologist for sentinel lymph node localization.   [place] AXILLA RIGHT  Result Date: 12/15/2020 CLINICAL DATA:  Patient is scheduled for bilateral mastectomies. Patient presented today for radioactive seed localization of the previously biopsied right axillary mass. EXAM: ULTRASOUND OF THE RIGHT AXILLA COMPARISON:  Previous exam(s). FINDINGS: On physical exam,no right axillary mass is palpated. Ultrasound is performed, showing a few adjacent right axillary lymph nodes without cortical thickening. No biopsy clip is able to be visualized on current ultrasound exam and therefore the previously biopsied node is not able to  be localized with a radioactive seed. IMPRESSION: The biopsy marking clip within the right axillary node/mass is not able to be visualized today on ultrasound and therefore radioactive seed is not able to be placed. RECOMMENDATION: Treatment for known right breast cancer. I have discussed the findings and recommendations with the patient. If applicable, a reminder letter will be sent to the patient regarding the next appointment. BI-RADS CATEGORY  6: Known biopsy-proven malignancy. Electronically Signed   By: Lovey Newcomer M.D.   On: 12/15/2020 14:57   Anti-infectives: Anti-infectives (From admission, onward)    Start     Dose/Rate Route Frequency Ordered Stop   12/16/20 1400  ceFAZolin (ANCEF) IVPB 2g/100 mL premix        2 g 200 mL/hr over 30 Minutes Intravenous Every 8 hours 12/16/20 1333     12/16/20 0748  ceFAZolin (ANCEF) 2-4 GM/100ML-% IVPB       Note to Pharmacy: Elyse Jarvis, Tammy   : cabinet override      12/16/20 0748 12/16/20 0949   12/16/20 0745  ceFAZolin (ANCEF) IVPB 2g/100 mL premix        2 g 200 mL/hr over 30 Minutes Intravenous On call to O.R. 12/16/20 0730 12/16/20 1345       Assessment/Plan: s/p Procedure(s): TOTAL MASTECTOMY (Bilateral) TARGETED RIGHT SENTINEL LYMPH NODE DISSECTION (Right) REMOVAL PORT-A-CATH (Right) IMMEDIATE BILATERAL BREAST RECONSTRUCTION WITH PLACEMENT OF TISSUE EXPANDER AND FLEX HD (ACELLULAR HYDRATED DERMIS) (Bilateral) CYST EXCISION LEFT AXILLA (Left) Advance diet Discharge Teach pt drain care  LOS: 0 days    [name] 12/17/2020

## 2020-12-17 NOTE — Anesthesia Postprocedure Evaluation (Signed)
Anesthesia Post Note  Patient: Sharon Fischer  Procedure(s) Performed: TOTAL MASTECTOMY (Bilateral: Breast) TARGETED RIGHT SENTINEL LYMPH NODE DISSECTION (Right: Axilla) REMOVAL PORT-A-CATH (Right: Breast) CYST EXCISION LEFT AXILLA (Left: Axilla) IMMEDIATE BILATERAL BREAST RECONSTRUCTION WITH PLACEMENT OF TISSUE EXPANDER AND FLEX HD (ACELLULAR HYDRATED DERMIS) (Bilateral: Breast)     Patient location during evaluation: PACU Anesthesia Type: General and Regional Level of consciousness: awake and alert Pain management: pain level controlled Vital Signs Assessment: post-procedure vital signs reviewed and stable Respiratory status: spontaneous breathing, nonlabored ventilation, respiratory function stable and patient connected to nasal cannula oxygen Cardiovascular status: blood pressure returned to baseline and stable Postop Assessment: no apparent nausea or vomiting Anesthetic complications: no   No notable events documented.  Last Vitals:  Vitals:   12/16/20 2025 12/17/20 0549  BP: 119/69 107/64  Pulse: 94 84  Resp: 19 18  Temp: 36.4 C 36.7 C  SpO2: 95% 98%    Last Pain:  Vitals:   12/17/20 0610  TempSrc:   PainSc: 2                  Adarian Bur L [name]

## 2020-12-22 LAB — SURGICAL PATHOLOGY

## 2020-12-24 NOTE — Progress Notes (Signed)
Patient Care Team: Muse, Noel Journey., PA-C as PCP - General Brien Mates, RN as Oncology Nurse Navigator (Oncology) Mauro Kaufmann, RN as Oncology Nurse Navigator Rockwell Germany, RN as Oncology Nurse Navigator Nicholas Lose, MD as Consulting Physician (Hematology and Oncology) Jovita Kussmaul, MD as Consulting Physician (General Surgery)  DIAGNOSIS:    ICD-10-CM   1. Malignant neoplasm of upper-inner quadrant of left breast in female, estrogen receptor negative ([place])  C50.212 ECHOCARDIOGRAM COMPLETE   Z17.1       SUMMARY OF ONCOLOGIC HISTORY: Oncology History  Malignant neoplasm of upper-inner quadrant of left breast in female, estrogen receptor negative ([place])  01/2020 Initial Diagnosis   Palpable right breast mass growing since July 2021.  Mammogram revealed 2.2 cm right breast mass with a 0.8 cm satellite lesion.  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   03/31/2020 Cancer Staging   Staging form: Breast, AJCC 8th Edition - Clinical stage from 03/31/2020: Stage IIIB (cT2, cN1(f), cM0, G3, ER-, PR-, HER2-) - Signed by Nicholas Lose, MD on 04/02/2020    04/09/2020 - 08/06/2020 Chemotherapy    Patient is on Treatment Plan: BREAST AC Q21D / CARBOPLATIN D1 + PACLITAXEL D1,8,15 Q21D, Keytruda discontinued for pneumonitis and Taxol discontinued after 4 cycles because of failure to thrive       12/16/2020 Surgery   Bilateral mastectomies   Left mastectomy: Intraductal papilloma, UDH Right mastectomy: No residual carcinoma was 1/3 lymph nodes positive ER negative, PR +20%, HER2 2+ by IHC, positive by FISH ratio 2.03     CHIEF COMPLIANT: Follow-up of breast cancer  INTERVAL HISTORY: Trust Crago is a 55 y.o. with above-mentioned history of breast cancer who completed neoadjuvant chemotherapy. She underwent bilateral mastectomies on 12/16/20 with Dr. Marlou Starks for  which the pathology showed intraductal papilloma and no evidence of malignancy in the left breast, benign epidermal cyst with no evidence of malignancy in the left axillary cyst, fibrosis with mild inflammation and no residual carcinoma in the right breast, 1 out of 3 right axillary lymph nodes positive for metastatic carcinoma. She presents to the clinic today for follow-up.  She is uncomfortable with the drains but otherwise doing well with the pain issues.  She rates her neuropathy as 5-6 out of 10.  ALLERGIES:  has No Known Allergies.  MEDICATIONS:  Current Outpatient Medications  Medication Sig Dispense Refill   acetaminophen (TYLENOL) 325 MG tablet Take 325 mg by mouth every 6 (six) hours as needed (FOR PAIN).     albuterol (VENTOLIN HFA) 108 (90 Base) MCG/ACT inhaler Inhale 1-2 puffs into the lungs every 4 (four) hours as needed for wheezing or shortness of breath. 8 g 2   atorvastatin (LIPITOR) 40 MG tablet Take 40 mg by mouth at bedtime.     cephALEXin (KEFLEX) 500 MG capsule Take 500 mg by mouth 4 (four) times daily.     dexamethasone (DECADRON) 1 MG tablet Take 1 tablet (1 mg total) by mouth daily. (Patient taking differently: Take 1 mg by mouth in the morning.) 30 tablet 2   diazepam (VALIUM) 2 MG tablet Take 1 tablet (2 mg total) by mouth every 12 (twelve) hours as needed for muscle spasms. 20 tablet 0   gabapentin (NEURONTIN) 300 MG capsule Take 1 capsule (300 mg total) by mouth 2 (two) times daily. 60 capsule 0   hydrochlorothiazide (HYDRODIURIL)  12.5 MG tablet Take 12.5 mg by mouth in the morning.     HYDROcodone-acetaminophen (NORCO/VICODIN) 5-325 MG tablet Take 1 tablet by mouth every 6 (six) hours as needed for severe pain.     ondansetron (ZOFRAN) 4 MG tablet Take 1 tablet (4 mg total) by mouth every 8 (eight) hours as needed for nausea or vomiting. 20 tablet 0   oxybutynin (DITROPAN-XL) 5 MG 24 hr tablet Take 1 tablet (5 mg total) by mouth at bedtime. (Patient not taking:  Reported on 12/03/2020) 30 tablet 1   potassium chloride SA (KLOR-CON) 20 MEQ tablet Take 1 tablet (20 mEq total) by mouth daily. (Patient not taking: Reported on 12/03/2020) 14 tablet 0   No current facility-administered medications for this visit.    PHYSICAL EXAMINATION: ECOG PERFORMANCE STATUS: 1 - Symptomatic but completely ambulatory  Vitals:   12/25/20 0837  BP: 118/67  Pulse: (!) 117  Resp: 20  Temp: 97.7 F (36.5 C)  SpO2: 97%   Filed Weights   12/25/20 0837  Weight: 148 lb 14.4 oz (67.5 kg)     LABORATORY DATA:  I have reviewed the data as listed CMP Latest Ref Rng & Units 12/11/2020 10/23/2020 10/16/2020  Glucose 70 - 99 mg/dL 89 59(L) 122(H)  BUN 6 - 20 mg/dL _0 Creatinine 0.44 - 1.00 mg/dL 0.68 0.70 1.01(H)  Sodium 135 - 145 mmol/L 141 139 138  Potassium 3.5 - 5.1 mmol/L 3.3(L) 2.9(L) 3.8  Chloride 98 - 111 mmol/L 105 107 104  CO2 22 - 32 mmol/L 28 28 20(L)  Calcium 8.9 - 10.3 mg/dL 9.5 8.5(L) 10.8(H)  Total Protein 6.5 - 8.1 g/dL - 5.5(L) 6.3(L)  Total Bilirubin 0.3 - 1.2 mg/dL - 0.2(L) 0.6  Alkaline Phos 38 - 126 U/L - 85 154(H)  AST 15 - 41 U/L - 48(H) 24  ALT 0 - 44 U/L - 32 8    Lab Results  Component Value Date   WBC 6.3 12/11/2020   HGB 13.0 12/11/2020   HCT 40.7 12/11/2020   MCV 94.4 12/11/2020   PLT 235 12/11/2020   NEUTROABS 2.0 10/23/2020    ASSESSMENT & PLAN:  Malignant neoplasm of upper-inner quadrant of left breast in female, estrogen receptor negative ([place]) 01/2020: Palpable right breast mass growing since July 2021.  Mammogram revealed 2.2 cm right breast mass with a 0.8 cm satellite lesion.  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   Treatment plan  1. Neoadjuvant chemotherapy with Adriamycin and Cytoxan pembrolizumab 4 followed by Taxol weekly 12 with carboplatin pembrolizumab every 3 weeks x4, followed by  pembrolizumab maintenance to complete 1 year 2. Followed by breast conserving surgery with targeted axillary dissection 3. 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  Decreased appetite: dexamethasone 1 mg daily. Bilateral mastectomies 12/16/2020 Left mastectomy: Intraductal papilloma,  UDH Right mastectomy: No residual carcinoma was 1/3 lymph nodes positive ER negative, PR 20% positive, HER2, 2+ by IHC, FISH positive ratio 2.03, copy #6.2  Pathology review: She had an excellent response in the breast but still residual cancer in the lymph node.  Surprisingly the cancer is HER2 positive. Therefore my recommendation is 1 year of Herceptin. I counseled her about the neuropathy clinical trial. She will start Herceptin every 3 weeks starting 01/15/2021.  Orders Placed This Encounter  Procedures   ECHOCARDIOGRAM COMPLETE    Standing Status:   Future    Standing Expiration Date:   12/25/2021    Order Specific Question:   Where should this test be performed    Answer:   [facility]    Order Specific Question:   Perflutren DEFINITY (image enhancing agent) should be administered unless hypersensitivity or allergy exist    Answer:   Administer Perflutren    Order Specific  Question:   Reason for exam-Echo    Answer:   Chemo  Z09    The patient has a good understanding of the overall plan. she agrees with it. she will call with any problems that may develop before the next visit here.  Total time spent: 30 mins including face to face time and time spent for planning, charting and coordination of care  Rulon Eisenmenger, MD, MPH 12/25/2020  I, Thana Ates, am acting as scribe for Dr. Nicholas Lose.  I have reviewed the above documentation for accuracy and completeness, and I agree with the above.

## 2020-12-25 ENCOUNTER — Encounter: Payer: Self-pay | Admitting: Plastic Surgery

## 2020-12-25 ENCOUNTER — Telehealth: Payer: Self-pay | Admitting: *Deleted

## 2020-12-25 ENCOUNTER — Encounter: Payer: Self-pay | Admitting: *Deleted

## 2020-12-25 ENCOUNTER — Encounter: Payer: Self-pay | Admitting: Radiology

## 2020-12-25 ENCOUNTER — Ambulatory Visit (INDEPENDENT_AMBULATORY_CARE_PROVIDER_SITE_OTHER): Payer: Medicaid Other | Admitting: Plastic Surgery

## 2020-12-25 ENCOUNTER — Inpatient Hospital Stay: Payer: Medicaid Other | Attending: Hematology and Oncology | Admitting: Hematology and Oncology

## 2020-12-25 ENCOUNTER — Other Ambulatory Visit: Payer: Self-pay

## 2020-12-25 VITALS — BP 118/67 | HR 117 | Temp 97.7°F | Resp 20 | Wt 148.9 lb

## 2020-12-25 DIAGNOSIS — Z79899 Other long term (current) drug therapy: Secondary | ICD-10-CM | POA: Insufficient documentation

## 2020-12-25 DIAGNOSIS — G629 Polyneuropathy, unspecified: Secondary | ICD-10-CM | POA: Insufficient documentation

## 2020-12-25 DIAGNOSIS — R627 Adult failure to thrive: Secondary | ICD-10-CM | POA: Insufficient documentation

## 2020-12-25 DIAGNOSIS — C50212 Malignant neoplasm of upper-inner quadrant of left female breast: Secondary | ICD-10-CM | POA: Insufficient documentation

## 2020-12-25 DIAGNOSIS — Z7952 Long term (current) use of systemic steroids: Secondary | ICD-10-CM | POA: Diagnosis not present

## 2020-12-25 DIAGNOSIS — Z171 Estrogen receptor negative status [ER-]: Secondary | ICD-10-CM

## 2020-12-25 DIAGNOSIS — Z9013 Acquired absence of bilateral breasts and nipples: Secondary | ICD-10-CM | POA: Insufficient documentation

## 2020-12-25 NOTE — Addendum Note (Signed)
Addended by: Nicholas Lose on: 12/25/2020 11:34 AM   Modules accepted: Orders

## 2020-12-25 NOTE — Telephone Encounter (Signed)
Spoke with patient regarding where she would prefer to have her xrt treatments. She would like to have these closer to home at [facility] and so I will place a referral for that.

## 2020-12-25 NOTE — Research (Signed)
ACCRU-[place]-[id number] - TREATMENT OF ESTABLISHED CHEMOTHERAPY-INDUCED NEUROPATHY WITH N-PALMITOYLETHANOLAMIDE, A CANNABIMIMETIC NUTRACEUTICAL: A RANDOMIZED DOUBLE-BLIND PHASE II PILOT TRIAL   12/25/2020    9:15AM  INTRODUCTION:  Patient Sharon Fischer was identified by Dr. Lindi Adie as a potential candidate for the above listed study.  This Clinical Research Coordinator met with Sharon Fischer, [id number], on 12/25/20 in a manner and location that ensures patient privacy to discuss participation in the above listed research study.  Patient is Accompanied by a family member .  A copy of the informed consent document with embedded HIPAA language was provided to the patient.  Patient reads, speaks, and understands [place].   Patient was provided with the business card of this Coordinator and encouraged to contact the research team with any questions.  Approximately 15 minutes were spent with the patient reviewing the informed consent documents.  Patient was provided the option of taking informed consent documents home to review and was encouraged to review at their convenience with their support network, including other care providers. Patient took the consent documents home to review.Plan to check on eligibility and call patient next week to check on interest for study. Thanked patient for her time.   Carol Ada, RT(R)(T) Clinical Research Coordinator

## 2020-12-25 NOTE — Assessment & Plan Note (Signed)
01/2020:Palpable right breast mass growing since July 2021. Mammogram revealed 2.2 cm right breast mass with a 0.8 cm satellite lesion. 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  Treatment plan 1. Neoadjuvant chemotherapy with Adriamycin and Cytoxanpembrolizumab4 followed by Taxol weekly 12 with carboplatin pembrolizumabevery 3 weeks x4, followed by pembrolizumab maintenance to complete 1 year 2. Followed by breast conserving surgery with targeted axillary dissection 3. 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  Decreased appetite: dexamethasone 1 mg daily. Bilateral mastectomies 12/16/2020 Left mastectomy: Intraductal papilloma, UDH Right mastectomy: No residual carcinoma was 1/3 lymph nodes positive ER negative, PR 20% positive, HER2, 2+ by IHC, FISH positive ratio 2.03, copy #6.2  Pathology review: She had an excellent response in the breast but still residual cancer in the lymph node.  Surprisingly the cancer is HER2 positive. Therefore my recommendation is 1 year of Herceptin.      

## 2020-12-25 NOTE — Progress Notes (Signed)
   Subjective:    Patient ID: Sharon Fischer, female    DOB: [birth year], 54 y.o.   MRN: [id number]  Patient is a 55 year old female here for follow-up after undergoing bilateral breast mastectomies with reconstruction.  She has 150 cc in each expander.  She was very sore today at even light touch.  Her drain outputs been minimal.  No sign of hematoma or seroma.  No sign of infection.  Incisions are intact and dressings are in place.     Review of Systems  Constitutional: Negative.   Eyes: Negative.   Respiratory: Negative.    Endocrine: Negative.   Genitourinary: Negative.       Objective:   Physical Exam Vitals and nursing note reviewed.  Constitutional:      Appearance: Normal appearance.  Cardiovascular:     Rate and Rhythm: Normal rate.     Pulses: Normal pulses.  Pulmonary:     Effort: Pulmonary effort is normal.  Neurological:     Mental Status: She is alert. Mental status is at baseline.        Assessment & Plan:     ICD-10-CM   1. Malignant neoplasm of upper-inner quadrant of left breast in female, estrogen receptor negative ([place])  C50.212    Z17.1       No fill was done today.  We will plan on starting her fills next week.

## 2020-12-28 ENCOUNTER — Telehealth: Payer: Self-pay | Admitting: *Deleted

## 2020-12-28 NOTE — Telephone Encounter (Signed)
Placed referral for rad onc Dr. Sharyn Dross at [facility] records.

## 2020-12-28 NOTE — Addendum Note (Signed)
Addended by: Harl Bowie on: 12/28/2020 03:13 PM   Modules accepted: Orders

## 2020-12-29 ENCOUNTER — Telehealth: Payer: Self-pay | Admitting: Hematology and Oncology

## 2020-12-29 NOTE — Telephone Encounter (Signed)
Scheduled per 7/15 los. Called and spoke with pt confirmed 8/5 appt

## 2020-12-30 ENCOUNTER — Encounter: Payer: Self-pay | Admitting: *Deleted

## 2020-12-30 ENCOUNTER — Telehealth: Payer: Self-pay | Admitting: Radiology

## 2020-12-30 NOTE — Telephone Encounter (Addendum)
ACCRU-[place]-2102 - TREATMENT OF ESTABLISHED CHEMOTHERAPY-INDUCED NEUROPATHY WITH N-PALMITOYLETHANOLAMIDE, A CANNABIMIMETIC NUTRACEUTICAL: A RANDOMIZED DOUBLE-BLIND PHASE II PILOT TRIAL  12/30/2020       9:18AM  PHONE CALL: Left v/m for patient to return call to discuss potential participation in the above mentioned study.   Carol Ada, RT(R)(T) Clinical Research Coordinator  01/05/2021   10:25AM  PHONE CALL: Left v/m for patient to return call to discuss potential participation in the above mentioned study. Informed patient if she is interested in participation, please return call at her convenience.   Carol Ada, RT(R)(T) Clinical Research Coordinator

## 2021-01-04 ENCOUNTER — Encounter: Payer: Self-pay | Admitting: *Deleted

## 2021-01-05 ENCOUNTER — Encounter: Payer: Self-pay | Admitting: Plastic Surgery

## 2021-01-05 ENCOUNTER — Ambulatory Visit (INDEPENDENT_AMBULATORY_CARE_PROVIDER_SITE_OTHER): Payer: Medicaid Other | Admitting: Plastic Surgery

## 2021-01-05 ENCOUNTER — Other Ambulatory Visit: Payer: Self-pay

## 2021-01-05 DIAGNOSIS — Z171 Estrogen receptor negative status [ER-]: Secondary | ICD-10-CM

## 2021-01-05 DIAGNOSIS — C50212 Malignant neoplasm of upper-inner quadrant of left female breast: Secondary | ICD-10-CM

## 2021-01-05 NOTE — Progress Notes (Signed)
   Subjective:    Patient ID: Sharon Fischer, female    DOB: May 12, 1966, 55 y.o.   MRN: [id number]  The patient is a 55 year old female here for follow-up on her bilateral breast surgery.  She had bilateral mastectomies and expanders placed.  She has been doing well no signs of infection or hematoma.  Incisions are intact and the dressing is in place.  Pain is well controlled.   Review of Systems  Constitutional: Negative.   HENT: Negative.    Eyes: Negative.   Respiratory: Negative.    Cardiovascular: Negative.   Gastrointestinal: Negative.   Genitourinary: Negative.       Objective:   Physical Exam Vitals and nursing note reviewed.  Constitutional:      Appearance: Normal appearance.  HENT:     Head: Normocephalic and atraumatic.  Cardiovascular:     Rate and Rhythm: Normal rate.     Pulses: Normal pulses.  Pulmonary:     Effort: Pulmonary effort is normal.  Skin:    General: Skin is warm.     Capillary Refill: Capillary refill takes less than 2 seconds.  Neurological:     Mental Status: She is alert. Mental status is at baseline.        Assessment & Plan:     ICD-10-CM   1. Malignant neoplasm of upper-inner quadrant of left breast in female, estrogen receptor negative ([facility])  C50.212    Z17.1        We placed injectable saline in the Expander using a sterile technique: Right: 70 cc for a total of 220 / 535 cc Left: 70 cc for a total of 220 / 535 cc  I was able to remove both drains since the output was minimal.  No sign of hematoma.  Patient tolerated it really well.  I plan to see her back in 2 weeks.

## 2021-01-08 ENCOUNTER — Encounter: Payer: Self-pay | Admitting: Hematology and Oncology

## 2021-01-13 ENCOUNTER — Other Ambulatory Visit: Payer: Self-pay

## 2021-01-13 ENCOUNTER — Ambulatory Visit (HOSPITAL_COMMUNITY)
Admission: RE | Admit: 2021-01-13 | Discharge: 2021-01-13 | Disposition: A | Discharge status: Home / Self Care | Payer: Medicaid Other | Source: Ambulatory Visit | Attending: Hematology and Oncology | Admitting: Hematology and Oncology

## 2021-01-13 DIAGNOSIS — Z171 Estrogen receptor negative status [ER-]: Secondary | ICD-10-CM

## 2021-01-13 DIAGNOSIS — C50212 Malignant neoplasm of upper-inner quadrant of left female breast: Secondary | ICD-10-CM

## 2021-01-15 ENCOUNTER — Inpatient Hospital Stay: Payer: Medicaid Other

## 2021-01-15 ENCOUNTER — Telehealth: Payer: Self-pay | Admitting: *Deleted

## 2021-01-15 ENCOUNTER — Other Ambulatory Visit: Payer: Self-pay

## 2021-01-15 NOTE — Progress Notes (Unsigned)
Echo scheduled for this week wasn't completed and was rescheduled for 9/7. Spoke with provider covering for Dr. Lindi Adie, Dr. Alvy Bimler, decision made to hold off on new herceptin start until echo done. Spoke with patient to make aware, she states understanding.

## 2021-01-18 ENCOUNTER — Other Ambulatory Visit: Payer: Self-pay

## 2021-01-18 ENCOUNTER — Ambulatory Visit (HOSPITAL_COMMUNITY)
Admission: RE | Admit: 2021-01-18 | Discharge: 2021-01-18 | Disposition: A | Discharge status: Home / Self Care | Payer: Medicaid Other | Source: Ambulatory Visit | Attending: Hematology and Oncology | Admitting: Hematology and Oncology

## 2021-01-18 DIAGNOSIS — I10 Essential (primary) hypertension: Secondary | ICD-10-CM | POA: Diagnosis not present

## 2021-01-18 DIAGNOSIS — Z0189 Encounter for other specified special examinations: Secondary | ICD-10-CM | POA: Diagnosis not present

## 2021-01-18 DIAGNOSIS — Z171 Estrogen receptor negative status [ER-]: Secondary | ICD-10-CM | POA: Diagnosis not present

## 2021-01-18 DIAGNOSIS — Z01818 Encounter for other preprocedural examination: Secondary | ICD-10-CM | POA: Insufficient documentation

## 2021-01-18 DIAGNOSIS — C50212 Malignant neoplasm of upper-inner quadrant of left female breast: Secondary | ICD-10-CM | POA: Insufficient documentation

## 2021-01-18 LAB — ECHOCARDIOGRAM COMPLETE: S' Lateral: 2.3 cm

## 2021-01-18 NOTE — Progress Notes (Signed)
  Echocardiogram 2D Echocardiogram has been performed.  Christain Niznik G Jolynda Townley 01/18/2021, 9:04 AM

## 2021-01-18 NOTE — Progress Notes (Deleted)
Patient is a 55 year old female who presents here to clinic for follow-up for immediate bilateral breast reconstruction with placement of tissue expander and Flex HD performed 12/16/2020.  She is 4 weeks postop.  Patient was seen for initial postop visit on 01/05/2021.  Her drains were removed without complication.  Injectable saline was placed in the expanders.  She reports today that she is doing well, reports no pain after her last expander injection.  She is interested in additional fill today.  Chaperone present on exam.  On exam bilateral breast incisions are intact, no wounds are noted.  No erythema or cellulitic changes noted.  No swelling or subcutaneous fluid collections noted with palpation.  ***  (LAST: Right: 70 cc for a total of 220 / 535 cc Left: 70 cc for a total of 220 / 535 cc) --- DELETE***  We placed injectable saline in the Expander using a sterile technique: Right: *** cc for a total of *** / *** cc  Left: *** cc for a total of *** / *** cc  Patient is doing well, recommend following up in 2 weeks for additional fill.  There is no sign of infection, seroma, hematoma.  Recommend calling with questions or concerns.

## 2021-01-19 ENCOUNTER — Inpatient Hospital Stay: Payer: Medicaid Other

## 2021-01-19 ENCOUNTER — Encounter: Payer: Self-pay | Admitting: Plastic Surgery

## 2021-01-19 ENCOUNTER — Other Ambulatory Visit: Payer: Self-pay

## 2021-01-19 ENCOUNTER — Ambulatory Visit (INDEPENDENT_AMBULATORY_CARE_PROVIDER_SITE_OTHER): Payer: Medicaid Other | Admitting: Plastic Surgery

## 2021-01-19 DIAGNOSIS — C50212 Malignant neoplasm of upper-inner quadrant of left female breast: Secondary | ICD-10-CM

## 2021-01-19 DIAGNOSIS — Z171 Estrogen receptor negative status [ER-]: Secondary | ICD-10-CM

## 2021-01-19 NOTE — Progress Notes (Signed)
   Subjective:    Patient ID: Sharon Fischer, female    DOB: Oct 23, 1965, 55 y.o.   MRN: [id number]  The patient is a 55 year old female follow-up on her bilateral breast reconstruction.  She currently has 220 cc in each 535 cc expander.  She noticed some fluid coming out of the left incision.  There is no redness and there does not appear to be any sign of infection.  I am concerned.  Her pain tolerance is very low and her pain seems to be very high.  I want to be sure I do the right thing for her and they will create any pain.  She denies any fever or trauma to the area.  Review of Systems  Constitutional: Negative.   HENT: Negative.    Eyes: Negative.   Respiratory: Negative.    Cardiovascular: Negative.   Genitourinary: Negative.   Hematological: Negative.       Objective:   Physical Exam Nursing note reviewed.  Constitutional:      Appearance: Normal appearance.  Cardiovascular:     Rate and Rhythm: Normal rate.     Pulses: Normal pulses.  Pulmonary:     Effort: Pulmonary effort is normal.  Neurological:     Mental Status: She is alert. Mental status is at baseline.  Psychiatric:        Mood and Affect: Mood normal.        Assessment & Plan:     ICD-10-CM   1. Malignant neoplasm of upper-inner quadrant of left breast in female, estrogen receptor negative ([place])  C50.212    Z17.1       We remove 30 cc from the left expander after as I tried to evacuate seroma.  She did not seem to have any seroma.    Expander Volume: Right: 0 cc for a total of 220 / 535 cc Left: -30 cc for a total of 190 / 535 cc  I would like to see her next week.

## 2021-01-21 ENCOUNTER — Encounter: Payer: Self-pay | Admitting: Hematology and Oncology

## 2021-01-21 ENCOUNTER — Telehealth: Payer: Self-pay | Admitting: *Deleted

## 2021-01-21 NOTE — Telephone Encounter (Signed)
Daughter states RT needs to be delayed because of fillers. Family is concerned about delaying RT. If they wait for plastics to be complete, it will be 3 months before she can start RT. They are VERY upset. According to family radiation oncologist spoke with Dr Marla Roe and has agreed to wait on RT. They would like Dr Geralyn Flash opinion.   Her weight was 136 lbs yesterday: down from 148, BP 83/56 and she has thrush- tongue is white. Are requesting Diflucan.

## 2021-01-22 ENCOUNTER — Telehealth: Payer: Self-pay

## 2021-01-22 MED ORDER — FLUCONAZOLE 200 MG PO TABS: 200.0000 mg | ORAL_TABLET | Freq: Every day | ORAL | 0 refills | Status: DC

## 2021-01-22 NOTE — Telephone Encounter (Signed)
MD reviewed previous encounter.  MD in agreement with waiting for radiation until after plastic surgery is complete, the benefit will be the same.     Orders for Diflucan '200mg'$  daily X 7 days for thrush.      RN spoke with patient, and informed of MD recommendations.  Pt verbalized understanding.  All questions answered, no further needs at this time.

## 2021-01-28 ENCOUNTER — Encounter: Payer: Self-pay | Admitting: *Deleted

## 2021-01-29 ENCOUNTER — Other Ambulatory Visit: Payer: Self-pay

## 2021-01-29 ENCOUNTER — Ambulatory Visit (INDEPENDENT_AMBULATORY_CARE_PROVIDER_SITE_OTHER): Payer: Medicaid Other | Admitting: Physician Assistant

## 2021-01-29 DIAGNOSIS — C50212 Malignant neoplasm of upper-inner quadrant of left female breast: Secondary | ICD-10-CM

## 2021-01-29 DIAGNOSIS — Z171 Estrogen receptor negative status [ER-]: Secondary | ICD-10-CM

## 2021-01-29 NOTE — Progress Notes (Signed)
Patient is a 55 year old female s/p bilateral mastectomy with expander placement performed by Dr. Marla Roe 12/16/2020 who returns to clinic for follow-up.  She was seen most recently here in clinic on 01/19/2021 at which time she was complaining of drainage from left incision.  There was no evidence of infection.  The area was aspirated, but no obvious seroma.  There was no fill administered at that time due to patient's pain symptoms.  At end of visit, right expander was 220/535 and left expander was 190/535 cc.  Per notes last week from her oncology team, family voiced discontent regarding the delay of radiation therapy.  She was also being treated for thrush with Diflucan.    On my examination, patient is accompanied by her sister Diane.  She has ongoing discomfort at side of left JP drain insertion with very small wound noted, less than 1 cm.  She also has small wounds along incisions bilaterally.  She has not been applying any topical agents.    There was a small amount of drainage expressed from medial wound on left breast.  There is no surrounding warmth, induration, or erythema concerning for cellulitis.  Non purulent, not malodorous.  Discussed case with Dr. Marla Roe who personally evaluated patient.  We do not wish to delay necessary RT and given her wounds we are concerned that it will be months before we can begin expansion for ultimate implant placement.  Discussed possibility of removing her expanders and focusing on oncologic management and we can revisit reconstruction after RT.  Patient and sister voice understanding and agreement.  In interim, will have her apply 2 x 2 gauze pads with Vaseline to wounds bilaterally twice daily.  She will return in one week for wound check.    All questions answered.  She is encouraged to call should she develop any new concerns.   No expander fill today. Right: 0 cc for a total of 220 / 535 cc Left: 0 cc for a total of 190 / 535 cc

## 2021-02-05 ENCOUNTER — Other Ambulatory Visit: Payer: Self-pay | Admitting: Hematology and Oncology

## 2021-02-05 ENCOUNTER — Inpatient Hospital Stay: Payer: Medicaid Other | Attending: Hematology and Oncology

## 2021-02-05 ENCOUNTER — Encounter: Payer: Self-pay | Admitting: Plastic Surgery

## 2021-02-05 ENCOUNTER — Ambulatory Visit (INDEPENDENT_AMBULATORY_CARE_PROVIDER_SITE_OTHER): Payer: Medicaid Other | Admitting: Plastic Surgery

## 2021-02-05 ENCOUNTER — Other Ambulatory Visit: Payer: Self-pay

## 2021-02-05 VITALS — BP 131/73 | HR 78 | Ht 67.0 in | Wt 135.0 lb

## 2021-02-05 VITALS — BP 106/54 | HR 74 | Temp 98.2°F | Resp 18

## 2021-02-05 DIAGNOSIS — Z9013 Acquired absence of bilateral breasts and nipples: Secondary | ICD-10-CM | POA: Diagnosis not present

## 2021-02-05 DIAGNOSIS — C50212 Malignant neoplasm of upper-inner quadrant of left female breast: Secondary | ICD-10-CM | POA: Diagnosis present

## 2021-02-05 DIAGNOSIS — Z79899 Other long term (current) drug therapy: Secondary | ICD-10-CM | POA: Insufficient documentation

## 2021-02-05 DIAGNOSIS — Z171 Estrogen receptor negative status [ER-]: Secondary | ICD-10-CM | POA: Diagnosis not present

## 2021-02-05 DIAGNOSIS — Z5112 Encounter for antineoplastic immunotherapy: Secondary | ICD-10-CM | POA: Diagnosis present

## 2021-02-05 MED ORDER — TRASTUZUMAB-HYALURONIDASE-OYSK 600-10000 MG-UNT/5ML ~~LOC~~ SOLN
600.0000 mg | Freq: Once | SUBCUTANEOUS | Status: AC
  Administered 2021-02-05: 600 mg via SUBCUTANEOUS
  Filled 2021-02-05: qty 5

## 2021-02-05 MED ORDER — DIPHENHYDRAMINE HCL 25 MG PO CAPS
50.0000 mg | ORAL_CAPSULE | Freq: Once | ORAL | Status: AC
  Administered 2021-02-05: 50 mg via ORAL
  Filled 2021-02-05: qty 2

## 2021-02-05 MED ORDER — ACETAMINOPHEN 325 MG PO TABS
650.0000 mg | ORAL_TABLET | Freq: Once | ORAL | Status: AC
  Administered 2021-02-05: 650 mg via ORAL
  Filled 2021-02-05: qty 2

## 2021-02-05 NOTE — Progress Notes (Signed)
Patient ID: Sharon Fischer, female    DOB: February 27, 1966, 55 y.o.   MRN: [id number]   Chief Complaint  Patient presents with   Follow-up    The patient is a 55 year old female here for a history and physical as well as follow-up on her breast reconstruction.  I went ahead and removed 120 cc from each expander.  She has a 1 cm area of superficial breakdown of the skin.  After her last visit the decision was made to go ahead and remove the expanders.  There is concerned about delaying her cancer treatment by continuing with the expanders.  The patient is in agreement.  She knows that she is going to be in therapy for probably about the next year.  She feels this is the best course of action.  I agree with her.   Review of Systems  Constitutional: Negative.   HENT: Negative.    Eyes: Negative.   Respiratory: Negative.    Cardiovascular: Negative.   Gastrointestinal: Negative.   Endocrine: Negative.   Genitourinary: Negative.   Neurological: Negative.   Hematological: Negative.   Psychiatric/Behavioral: Negative.     Past Medical History:  Diagnosis Date   Breast cancer ([place]) 03/2020   Family history of throat cancer    Hypertension     Past Surgical History:  Procedure Laterality Date   AXILLARY SENTINEL NODE BIOPSY Right 12/16/2020   Procedure: TARGETED RIGHT SENTINEL LYMPH NODE DISSECTION;  Surgeon: Jovita Kussmaul, MD;  Location: [place];  Service: General;  Laterality: Right;   BREAST CYST EXCISION Left 12/16/2020   Procedure: CYST EXCISION LEFT AXILLA;  Surgeon: Jovita Kussmaul, MD;  Location: [place];  Service: General;  Laterality: Left;   BREAST RECONSTRUCTION WITH PLACEMENT OF TISSUE EXPANDER AND FLEX HD (ACELLULAR HYDRATED DERMIS) Bilateral 12/16/2020   Procedure: IMMEDIATE BILATERAL BREAST RECONSTRUCTION WITH PLACEMENT OF TISSUE EXPANDER AND FLEX HD (ACELLULAR HYDRATED DERMIS);  Surgeon: Wallace Going, DO;  Location: [place];  Service: Plastics;  Laterality: Bilateral;   IR  IMAGING GUIDED PORT INSERTION  03/30/2020   PORT-A-CATH REMOVAL Right 12/16/2020   Procedure: REMOVAL PORT-A-CATH;  Surgeon: Jovita Kussmaul, MD;  Location: [place];  Service: General;  Laterality: Right;   TOTAL MASTECTOMY Bilateral 12/16/2020   Procedure: TOTAL MASTECTOMY;  Surgeon: [name], MD;  Location: [place];  Service: General;  Laterality: Bilateral;      Current Outpatient Medications:    acetaminophen (TYLENOL) 325 MG tablet, Take 325 mg by mouth every 6 (six) hours as needed (FOR PAIN)., Disp: , Rfl:    atorvastatin (LIPITOR) 40 MG tablet, Take 40 mg by mouth at bedtime., Disp: , Rfl:    carvedilol (COREG) 3.125 MG tablet, Take 3.125 mg by mouth 2 (two) times daily., Disp: , Rfl:    dexamethasone (DECADRON) 1 MG tablet, Take 1 tablet (1 mg total) by mouth daily. (Patient taking differently: Take 1 mg by mouth in the morning.), Disp: 30 tablet, Rfl: 2   diazepam (VALIUM) 2 MG tablet, Take 1 tablet (2 mg total) by mouth every 12 (twelve) hours as needed for muscle spasms., Disp: 20 tablet, Rfl: 0   gabapentin (NEURONTIN) 300 MG capsule, Take by mouth., Disp: , Rfl:    hydrochlorothiazide (HYDRODIURIL) 12.5 MG tablet, Take by mouth., Disp: , Rfl:    albuterol (VENTOLIN HFA) 108 (90 Base) MCG/ACT inhaler, Inhale 1-2 puffs into the lungs every 4 (four) hours as needed for wheezing or shortness of breath. (Patient  not taking: Reported on 02/05/2021), Disp: 8 g, Rfl: 2   LORazepam (ATIVAN) 0.5 MG tablet, Take by mouth., Disp: , Rfl:    Objective:   Vitals:   02/05/21 1305  BP: 131/73  Pulse: 78  SpO2: 100%    Physical Exam Vitals and nursing note reviewed.  Constitutional:      Appearance: Normal appearance.  HENT:     Head: Normocephalic and atraumatic.  Cardiovascular:     Rate and Rhythm: Normal rate.     Pulses: Normal pulses.  Pulmonary:     Effort: Pulmonary effort is normal. No respiratory distress.  Abdominal:     General: Abdomen is flat. There is no distension.      Tenderness: There is no abdominal tenderness.  Musculoskeletal:        General: Tenderness present. No swelling or deformity.  Skin:    General: Skin is warm.     Coloration: Skin is not jaundiced or pale.     Findings: No erythema.  Neurological:     General: No focal deficit present.     Mental Status: She is alert. Mental status is at baseline.  Psychiatric:        Mood and Affect: Mood normal.        Behavior: Behavior normal.    Assessment & Plan:  Malignant neoplasm of upper-inner quadrant of left breast in female, estrogen receptor negative ([place]) Plan for removal of bilateral breast expanders.  Removal of 120 cc of saline today.  Consent was given and the patient agrees to removal of her expanders.  [place], DO

## 2021-02-05 NOTE — Patient Instructions (Signed)
[facility]  Discharge Instructions: Thank you for choosing [facility] to provide your oncology and hematology care.   If you have a lab appointment with the Cancer Center, please go directly to the Cancer Center and check in at the registration area.   Wear comfortable clothing and clothing appropriate for easy access to any Portacath or PICC line.   We strive to give you quality time with your provider. You may need to reschedule your appointment if you arrive late (15 or more minutes).  Arriving late affects you and other patients whose appointments are after yours.  Also, if you miss three or more appointments without notifying the office, you may be dismissed from the clinic at the provider's discretion.      For prescription refill requests, have your pharmacy contact our office and allow 72 hours for refills to be completed.    Today you received the following chemotherapy and/or immunotherapy agents herceptin hylecta    To help prevent nausea and vomiting after your treatment, we encourage you to take your nausea medication as directed.  BELOW ARE SYMPTOMS THAT SHOULD BE REPORTED IMMEDIATELY: . *FEVER GREATER THAN 100.4 F (38 C) OR HIGHER . *CHILLS OR SWEATING . *NAUSEA AND VOMITING THAT IS NOT CONTROLLED WITH YOUR NAUSEA MEDICATION . *UNUSUAL SHORTNESS OF BREATH . *UNUSUAL BRUISING OR BLEEDING . *URINARY PROBLEMS (pain or burning when urinating, or frequent urination) . *BOWEL PROBLEMS (unusual diarrhea, constipation, pain near the anus) . TENDERNESS IN MOUTH AND THROAT WITH OR WITHOUT PRESENCE OF ULCERS (sore throat, sores in mouth, or a toothache) . UNUSUAL RASH, SWELLING OR PAIN  . UNUSUAL VAGINAL DISCHARGE OR ITCHING   Items with * indicate a potential emergency and should be followed up as soon as possible or go to the Emergency Department if any problems should occur.  Please show the CHEMOTHERAPY ALERT CARD or IMMUNOTHERAPY  ALERT CARD at check-in to the Emergency Department and triage nurse.  Should you have questions after your visit or need to cancel or reschedule your appointment, please contact [facility]  Dept: [phone]  and follow the prompts.  Office hours are 8:00 a.m. to 4:30 p.m. Monday - Friday. Please note that voicemails left after 4:00 p.m. may not be returned until the following business day.  We are closed weekends and major holidays. You have access to a nurse at all times for urgent questions. Please call the main number to the clinic Dept: [phone] and follow the prompts.   For any non-urgent questions, you may also contact your provider using MyChart. We now offer e-Visits for anyone 57 and older to request care online for non-urgent symptoms. For details visit [email or web address].   Also download the MyChart app! Go to the app store, search "MyChart", open the app, select , and log in with your MyChart username and password.  Due to Covid, a mask is required upon entering the hospital/clinic. If you do not have a mask, one will be given to you upon arrival. For doctor visits, patients may have 1 support person aged 18 or older with them. For treatment visits, patients cannot have anyone with them due to current Covid guidelines and our immunocompromised population.   

## 2021-02-05 NOTE — H&P (View-Only) (Signed)
Patient ID: Sharon Fischer, female    DOB: October 10, 1965, 55 y.o.   MRN: [id number]   Chief Complaint  Patient presents with   Follow-up    The patient is a 55 year old female here for a history and physical as well as follow-up on her breast reconstruction.  I went ahead and removed 120 cc from each expander.  She has a 1 cm area of superficial breakdown of the skin.  After her last visit the decision was made to go ahead and remove the expanders.  There is concerned about delaying her cancer treatment by continuing with the expanders.  The patient is in agreement.  She knows that she is going to be in therapy for probably about the next year.  She feels this is the best course of action.  I agree with her.   Review of Systems  Constitutional: Negative.   HENT: Negative.    Eyes: Negative.   Respiratory: Negative.    Cardiovascular: Negative.   Gastrointestinal: Negative.   Endocrine: Negative.   Genitourinary: Negative.   Neurological: Negative.   Hematological: Negative.   Psychiatric/Behavioral: Negative.     Past Medical History:  Diagnosis Date   Breast cancer ([place]) 03/2020   Family history of throat cancer    Hypertension     Past Surgical History:  Procedure Laterality Date   AXILLARY SENTINEL NODE BIOPSY Right 12/16/2020   Procedure: TARGETED RIGHT SENTINEL LYMPH NODE DISSECTION;  Surgeon: Jovita Kussmaul, MD;  Location: [place];  Service: General;  Laterality: Right;   BREAST CYST EXCISION Left 12/16/2020   Procedure: CYST EXCISION LEFT AXILLA;  Surgeon: Jovita Kussmaul, MD;  Location: [place];  Service: General;  Laterality: Left;   BREAST RECONSTRUCTION WITH PLACEMENT OF TISSUE EXPANDER AND FLEX HD (ACELLULAR HYDRATED DERMIS) Bilateral 12/16/2020   Procedure: IMMEDIATE BILATERAL BREAST RECONSTRUCTION WITH PLACEMENT OF TISSUE EXPANDER AND FLEX HD (ACELLULAR HYDRATED DERMIS);  Surgeon: Wallace Going, DO;  Location: [place];  Service: Plastics;  Laterality: Bilateral;   IR  IMAGING GUIDED PORT INSERTION  03/30/2020   PORT-A-CATH REMOVAL Right 12/16/2020   Procedure: REMOVAL PORT-A-CATH;  Surgeon: Jovita Kussmaul, MD;  Location: [place];  Service: General;  Laterality: Right;   TOTAL MASTECTOMY Bilateral 12/16/2020   Procedure: TOTAL MASTECTOMY;  Surgeon: [name], MD;  Location: [place];  Service: General;  Laterality: Bilateral;      Current Outpatient Medications:    acetaminophen (TYLENOL) 325 MG tablet, Take 325 mg by mouth every 6 (six) hours as needed (FOR PAIN)., Disp: , Rfl:    atorvastatin (LIPITOR) 40 MG tablet, Take 40 mg by mouth at bedtime., Disp: , Rfl:    carvedilol (COREG) 3.125 MG tablet, Take 3.125 mg by mouth 2 (two) times daily., Disp: , Rfl:    dexamethasone (DECADRON) 1 MG tablet, Take 1 tablet (1 mg total) by mouth daily. (Patient taking differently: Take 1 mg by mouth in the morning.), Disp: 30 tablet, Rfl: 2   diazepam (VALIUM) 2 MG tablet, Take 1 tablet (2 mg total) by mouth every 12 (twelve) hours as needed for muscle spasms., Disp: 20 tablet, Rfl: 0   gabapentin (NEURONTIN) 300 MG capsule, Take by mouth., Disp: , Rfl:    hydrochlorothiazide (HYDRODIURIL) 12.5 MG tablet, Take by mouth., Disp: , Rfl:    albuterol (VENTOLIN HFA) 108 (90 Base) MCG/ACT inhaler, Inhale 1-2 puffs into the lungs every 4 (four) hours as needed for wheezing or shortness of breath. (Patient  not taking: Reported on 02/05/2021), Disp: 8 g, Rfl: 2   LORazepam (ATIVAN) 0.5 MG tablet, Take by mouth., Disp: , Rfl:    Objective:   Vitals:   02/05/21 1305  BP: 131/73  Pulse: 78  SpO2: 100%    Physical Exam Vitals and nursing note reviewed.  Constitutional:      Appearance: Normal appearance.  HENT:     Head: Normocephalic and atraumatic.  Cardiovascular:     Rate and Rhythm: Normal rate.     Pulses: Normal pulses.  Pulmonary:     Effort: Pulmonary effort is normal. No respiratory distress.  Abdominal:     General: Abdomen is flat. There is no distension.      Tenderness: There is no abdominal tenderness.  Musculoskeletal:        General: Tenderness present. No swelling or deformity.  Skin:    General: Skin is warm.     Coloration: Skin is not jaundiced or pale.     Findings: No erythema.  Neurological:     General: No focal deficit present.     Mental Status: She is alert. Mental status is at baseline.  Psychiatric:        Mood and Affect: Mood normal.        Behavior: Behavior normal.    Assessment & Plan:  Malignant neoplasm of upper-inner quadrant of left breast in female, estrogen receptor negative ([place]) Plan for removal of bilateral breast expanders.  Removal of 120 cc of saline today.  Consent was given and the patient agrees to removal of her expanders.  [place], DO

## 2021-02-05 NOTE — Progress Notes (Signed)
Pt observed for 30 min post first time injection of Herceptin Hylecta. VSS. Discharged in stable condition.

## 2021-02-11 ENCOUNTER — Encounter (HOSPITAL_BASED_OUTPATIENT_CLINIC_OR_DEPARTMENT_OTHER): Payer: Self-pay | Admitting: Plastic Surgery

## 2021-02-16 ENCOUNTER — Encounter (HOSPITAL_BASED_OUTPATIENT_CLINIC_OR_DEPARTMENT_OTHER)
Admission: RE | Admit: 2021-02-16 | Discharge: 2021-02-16 | Disposition: A | Discharge status: Home / Self Care | Payer: Medicaid Other | Source: Ambulatory Visit | Attending: Plastic Surgery | Admitting: Plastic Surgery

## 2021-02-16 DIAGNOSIS — Z01812 Encounter for preprocedural laboratory examination: Secondary | ICD-10-CM | POA: Diagnosis not present

## 2021-02-16 LAB — BASIC METABOLIC PANEL
Anion gap: 8 (ref 5–15)
BUN: 8 mg/dL (ref 6–20)
CO2: 22 mmol/L (ref 22–32)
Calcium: 9.4 mg/dL (ref 8.9–10.3)
Chloride: 108 mmol/L (ref 98–111)
Creatinine, Ser: 0.6 mg/dL (ref 0.44–1.00)
GFR, Estimated: >60 mL/min (ref >60)
Glucose, Bld: 58 mg/dL — ABNORMAL LOW (ref 70–99)
Potassium: 4 mmol/L (ref 3.5–5.1)
Sodium: 138 mmol/L (ref 135–145)

## 2021-02-17 ENCOUNTER — Other Ambulatory Visit (HOSPITAL_COMMUNITY): Payer: Medicaid Other

## 2021-02-18 ENCOUNTER — Telehealth: Payer: Self-pay | Admitting: Plastic Surgery

## 2021-02-18 NOTE — Telephone Encounter (Signed)
Orders have been requested for this patient. Please call Alyse Low to update [phone]. Thank you.

## 2021-02-19 NOTE — Telephone Encounter (Signed)
Called and spoke with [employer] with [facility] regarding the message below.  She stated she called as a reminder for Dr. Marla Roe for orders for the patient.//AB/CMA

## 2021-02-22 ENCOUNTER — Ambulatory Visit (HOSPITAL_BASED_OUTPATIENT_CLINIC_OR_DEPARTMENT_OTHER): Payer: Medicaid Other | Admitting: Certified Registered"

## 2021-02-22 ENCOUNTER — Encounter (HOSPITAL_BASED_OUTPATIENT_CLINIC_OR_DEPARTMENT_OTHER)
Admission: RE | Disposition: A | Discharge status: Home / Self Care | Payer: Self-pay | Source: Home / Self Care | Attending: Plastic Surgery

## 2021-02-22 ENCOUNTER — Other Ambulatory Visit: Payer: Self-pay

## 2021-02-22 ENCOUNTER — Ambulatory Visit (HOSPITAL_BASED_OUTPATIENT_CLINIC_OR_DEPARTMENT_OTHER)
Admission: RE | Admit: 2021-02-22 | Discharge: 2021-02-22 | Disposition: A | Discharge status: Home / Self Care | Payer: Medicaid Other | Attending: Plastic Surgery | Admitting: Plastic Surgery

## 2021-02-22 ENCOUNTER — Encounter (HOSPITAL_BASED_OUTPATIENT_CLINIC_OR_DEPARTMENT_OTHER): Payer: Self-pay | Admitting: Plastic Surgery

## 2021-02-22 DIAGNOSIS — Z79899 Other long term (current) drug therapy: Secondary | ICD-10-CM | POA: Insufficient documentation

## 2021-02-22 DIAGNOSIS — I10 Essential (primary) hypertension: Secondary | ICD-10-CM | POA: Diagnosis not present

## 2021-02-22 DIAGNOSIS — Z9013 Acquired absence of bilateral breasts and nipples: Secondary | ICD-10-CM | POA: Diagnosis not present

## 2021-02-22 DIAGNOSIS — Z171 Estrogen receptor negative status [ER-]: Secondary | ICD-10-CM

## 2021-02-22 DIAGNOSIS — C50212 Malignant neoplasm of upper-inner quadrant of left female breast: Secondary | ICD-10-CM | POA: Insufficient documentation

## 2021-02-22 HISTORY — PX: REMOVAL OF BILATERAL TISSUE EXPANDERS WITH PLACEMENT OF BILATERAL BREAST IMPLANTS: SHX6431

## 2021-02-22 SURGERY — REMOVAL, TISSUE EXPANDER, BREAST, BILATERAL, WITH BILATERAL IMPLANT IMPLANT INSERTION
Anesthesia: General | Site: Breast | Laterality: Bilateral

## 2021-02-22 MED ORDER — HYDROMORPHONE HCL 1 MG/ML IJ SOLN
0.2500 mg | INTRAMUSCULAR | Status: DC | PRN
  Administered 2021-02-22: 0.5 mg via INTRAVENOUS

## 2021-02-22 MED ORDER — ACETAMINOPHEN 325 MG PO TABS: 650.0000 mg | ORAL_TABLET | ORAL | Status: DC | PRN

## 2021-02-22 MED ORDER — FENTANYL CITRATE (PF) 100 MCG/2ML IJ SOLN
INTRAMUSCULAR | Status: AC
  Filled 2021-02-22: qty 2

## 2021-02-22 MED ORDER — PROMETHAZINE HCL 25 MG/ML IJ SOLN: 6.2500 mg | INTRAMUSCULAR | Status: DC | PRN

## 2021-02-22 MED ORDER — PHENYLEPHRINE 40 MCG/ML (10ML) SYRINGE FOR IV PUSH (FOR BLOOD PRESSURE SUPPORT)
PREFILLED_SYRINGE | INTRAVENOUS | Status: AC
  Filled 2021-02-22: qty 10

## 2021-02-22 MED ORDER — CEFAZOLIN SODIUM-DEXTROSE 2-4 GM/100ML-% IV SOLN
INTRAVENOUS | Status: AC
  Filled 2021-02-22: qty 100

## 2021-02-22 MED ORDER — MIDAZOLAM HCL 5 MG/5ML IJ SOLN
INTRAMUSCULAR | Status: DC | PRN
  Administered 2021-02-22: 2 mg via INTRAVENOUS

## 2021-02-22 MED ORDER — SODIUM CHLORIDE 0.9% FLUSH: 3.0000 mL | INTRAVENOUS | Status: DC | PRN

## 2021-02-22 MED ORDER — LIDOCAINE-EPINEPHRINE 1 %-1:100000 IJ SOLN
INTRAMUSCULAR | Status: DC | PRN
  Administered 2021-02-22: 14 mL

## 2021-02-22 MED ORDER — OXYCODONE HCL 5 MG PO TABS: 5.0000 mg | ORAL_TABLET | Freq: Once | ORAL | Status: DC | PRN

## 2021-02-22 MED ORDER — PHENYLEPHRINE HCL (PRESSORS) 10 MG/ML IV SOLN
INTRAVENOUS | Status: DC | PRN
  Administered 2021-02-22: 120 ug via INTRAVENOUS
  Administered 2021-02-22: 80 ug via INTRAVENOUS
  Administered 2021-02-22: 120 ug via INTRAVENOUS
  Administered 2021-02-22: 80 ug via INTRAVENOUS
  Administered 2021-02-22: 120 ug via INTRAVENOUS
  Administered 2021-02-22: 80 ug via INTRAVENOUS
  Administered 2021-02-22: 120 ug via INTRAVENOUS
  Administered 2021-02-22: 80 ug via INTRAVENOUS

## 2021-02-22 MED ORDER — LACTATED RINGERS IV SOLN: INTRAVENOUS | Status: DC

## 2021-02-22 MED ORDER — MEPERIDINE HCL 25 MG/ML IJ SOLN: 6.2500 mg | INTRAMUSCULAR | Status: DC | PRN

## 2021-02-22 MED ORDER — PROPOFOL 10 MG/ML IV BOLUS
INTRAVENOUS | Status: AC
  Filled 2021-02-22: qty 20

## 2021-02-22 MED ORDER — SODIUM CHLORIDE 0.9 % IV SOLN
INTRAVENOUS | Status: AC
  Filled 2021-02-22: qty 10

## 2021-02-22 MED ORDER — ACETAMINOPHEN 325 MG RE SUPP: 650.0000 mg | RECTAL | Status: DC | PRN

## 2021-02-22 MED ORDER — DEXAMETHASONE SODIUM PHOSPHATE 4 MG/ML IJ SOLN
INTRAMUSCULAR | Status: DC | PRN
  Administered 2021-02-22: 6 mg via INTRAVENOUS

## 2021-02-22 MED ORDER — HYDROMORPHONE HCL 1 MG/ML IJ SOLN
INTRAMUSCULAR | Status: AC
  Filled 2021-02-22: qty 0.5

## 2021-02-22 MED ORDER — FENTANYL CITRATE (PF) 100 MCG/2ML IJ SOLN: 25.0000 ug | INTRAMUSCULAR | Status: DC | PRN

## 2021-02-22 MED ORDER — OXYCODONE HCL 5 MG PO TABS: 5.0000 mg | ORAL_TABLET | ORAL | Status: DC | PRN

## 2021-02-22 MED ORDER — ONDANSETRON HCL 4 MG/2ML IJ SOLN
INTRAMUSCULAR | Status: DC | PRN
  Administered 2021-02-22: 4 mg via INTRAVENOUS

## 2021-02-22 MED ORDER — OXYCODONE HCL 5 MG/5ML PO SOLN: 5.0000 mg | Freq: Once | ORAL | Status: DC | PRN

## 2021-02-22 MED ORDER — EPHEDRINE SULFATE 50 MG/ML IJ SOLN
INTRAMUSCULAR | Status: DC | PRN
  Administered 2021-02-22 (×2): 5 mg via INTRAVENOUS

## 2021-02-22 MED ORDER — CHLORHEXIDINE GLUCONATE CLOTH 2 % EX PADS: 6.0000 | MEDICATED_PAD | Freq: Once | CUTANEOUS | Status: DC

## 2021-02-22 MED ORDER — SODIUM CHLORIDE 0.9% FLUSH: 3.0000 mL | Freq: Two times a day (BID) | INTRAVENOUS | Status: DC

## 2021-02-22 MED ORDER — LIDOCAINE 2% (20 MG/ML) 5 ML SYRINGE
INTRAMUSCULAR | Status: AC
  Filled 2021-02-22: qty 5

## 2021-02-22 MED ORDER — MIDAZOLAM HCL 2 MG/2ML IJ SOLN
INTRAMUSCULAR | Status: AC
  Filled 2021-02-22: qty 2

## 2021-02-22 MED ORDER — SODIUM CHLORIDE 0.9 % IV SOLN: 250.0000 mL | INTRAVENOUS | Status: DC | PRN

## 2021-02-22 MED ORDER — 0.9 % SODIUM CHLORIDE (POUR BTL) OPTIME
TOPICAL | Status: DC | PRN
  Administered 2021-02-22: 300 mL

## 2021-02-22 MED ORDER — SODIUM CHLORIDE 0.9 % IV SOLN
INTRAVENOUS | Status: DC | PRN
  Administered 2021-02-22: 1000 mL

## 2021-02-22 MED ORDER — CEFAZOLIN SODIUM-DEXTROSE 2-4 GM/100ML-% IV SOLN
2.0000 g | INTRAVENOUS | Status: AC
  Administered 2021-02-22: 2 g via INTRAVENOUS

## 2021-02-22 MED ORDER — EPHEDRINE 5 MG/ML INJ
INTRAVENOUS | Status: AC
  Filled 2021-02-22: qty 5

## 2021-02-22 MED ORDER — PROPOFOL 10 MG/ML IV BOLUS
INTRAVENOUS | Status: DC | PRN
  Administered 2021-02-22: 100 mg via INTRAVENOUS

## 2021-02-22 MED ORDER — FENTANYL CITRATE (PF) 100 MCG/2ML IJ SOLN
INTRAMUSCULAR | Status: DC | PRN
  Administered 2021-02-22: 50 ug via INTRAVENOUS

## 2021-02-22 MED ORDER — LIDOCAINE 2% (20 MG/ML) 5 ML SYRINGE
INTRAMUSCULAR | Status: DC | PRN
  Administered 2021-02-22: 60 mg via INTRAVENOUS

## 2021-02-22 SURGICAL SUPPLY — 55 items
ADH SKN CLS APL DERMABOND .7 (GAUZE/BANDAGES/DRESSINGS) ×2
BAG DECANTER FOR FLEXI CONT (MISCELLANEOUS) ×2 IMPLANT
BINDER BREAST LRG (GAUZE/BANDAGES/DRESSINGS) ×1 IMPLANT
BIOPATCH RED 1 DISK 7.0 (GAUZE/BANDAGES/DRESSINGS) ×2 IMPLANT
BLADE HEX COATED 2.75 (ELECTRODE) ×2 IMPLANT
BLADE SURG 15 STRL LF DISP TIS (BLADE) ×2 IMPLANT
BLADE SURG 15 STRL SS (BLADE) ×4
CANISTER SUCT 1200ML W/VALVE (MISCELLANEOUS) ×2 IMPLANT
COVER BACK TABLE 60X90IN (DRAPES) ×2 IMPLANT
COVER MAYO STAND STRL (DRAPES) ×2 IMPLANT
DERMABOND ADVANCED (GAUZE/BANDAGES/DRESSINGS) ×2
DERMABOND ADVANCED .7 DNX12 (GAUZE/BANDAGES/DRESSINGS) IMPLANT
DRAPE LAPAROSCOPIC ABDOMINAL (DRAPES) ×2 IMPLANT
DRSG OPSITE POSTOP 4X6 (GAUZE/BANDAGES/DRESSINGS) ×4 IMPLANT
DRSG PAD ABDOMINAL 8X10 ST (GAUZE/BANDAGES/DRESSINGS) ×2 IMPLANT
DRSG TEGADERM 2-3/8X2-3/4 SM (GAUZE/BANDAGES/DRESSINGS) ×2 IMPLANT
ELECT BLADE 4.0 EZ CLEAN MEGAD (MISCELLANEOUS)
ELECT REM PT RETURN 9FT ADLT (ELECTROSURGICAL) ×2
ELECTRODE BLDE 4.0 EZ CLN MEGD (MISCELLANEOUS) ×1 IMPLANT
ELECTRODE REM PT RTRN 9FT ADLT (ELECTROSURGICAL) ×1 IMPLANT
EVACUATOR SILICONE 100CC (DRAIN) ×2 IMPLANT
GLOVE SURG ENC MOIS LTX SZ6 (GLOVE) ×7 IMPLANT
GLOVE SURG ENC MOIS LTX SZ7.5 (GLOVE) ×1 IMPLANT
GLOVE SURG POLYISO LF SZ7 (GLOVE) ×3 IMPLANT
GLOVE SURG UNDER POLY LF SZ7 (GLOVE) ×1 IMPLANT
GLOVE SURG UNDER POLY LF SZ7.5 (GLOVE) ×1 IMPLANT
GOWN STRL REUS W/ TWL LRG LVL3 (GOWN DISPOSABLE) ×2 IMPLANT
GOWN STRL REUS W/ TWL XL LVL3 (GOWN DISPOSABLE) IMPLANT
GOWN STRL REUS W/TWL LRG LVL3 (GOWN DISPOSABLE) ×4
GOWN STRL REUS W/TWL XL LVL3 (GOWN DISPOSABLE) ×2
NDL HYPO 25X1 1.5 SAFETY (NEEDLE) ×1 IMPLANT
NDL SAFETY ECLIPSE 18X1.5 (NEEDLE) ×1 IMPLANT
NEEDLE HYPO 18GX1.5 SHARP (NEEDLE)
NEEDLE HYPO 25X1 1.5 SAFETY (NEEDLE) ×2 IMPLANT
PACK BASIN DAY SURGERY FS (CUSTOM PROCEDURE TRAY) ×2 IMPLANT
PENCIL SMOKE EVACUATOR (MISCELLANEOUS) ×2 IMPLANT
PIN SAFETY STERILE (MISCELLANEOUS) ×1 IMPLANT
SLEEVE SCD COMPRESS KNEE MED (STOCKING) ×2 IMPLANT
SPONGE T-LAP 18X18 ~~LOC~~+RFID (SPONGE) ×4 IMPLANT
STRIP SUTURE WOUND CLOSURE 1/2 (MISCELLANEOUS) ×1 IMPLANT
SUT MNCRL AB 4-0 PS2 18 (SUTURE) ×4 IMPLANT
SUT MON AB 3-0 SH 27 (SUTURE) ×6
SUT MON AB 3-0 SH27 (SUTURE) ×4 IMPLANT
SUT MON AB 5-0 PS2 18 (SUTURE) ×3 IMPLANT
SUT PDS 3-0 CT2 (SUTURE) ×6
SUT PDS AB 2-0 CT2 27 (SUTURE) IMPLANT
SUT PDS II 3-0 CT2 27 ABS (SUTURE) ×2 IMPLANT
SUT SILK 3 0 PS 1 (SUTURE) ×2 IMPLANT
SYR BULB IRRIG 60ML STRL (SYRINGE) ×2 IMPLANT
SYR CONTROL 10ML LL (SYRINGE) ×2 IMPLANT
TOWEL GREEN STERILE FF (TOWEL DISPOSABLE) ×4 IMPLANT
TRAY DSU PREP LF (CUSTOM PROCEDURE TRAY) ×2 IMPLANT
TUBE CONNECTING 20X1/4 (TUBING) ×2 IMPLANT
UNDERPAD 30X36 HEAVY ABSORB (UNDERPADS AND DIAPERS) ×4 IMPLANT
YANKAUER SUCT BULB TIP NO VENT (SUCTIONS) ×2 IMPLANT

## 2021-02-22 NOTE — Anesthesia Preprocedure Evaluation (Signed)
Anesthesia Evaluation    Reviewed: Allergy & Precautions, Patient's Chart, lab work & pertinent test results, Unable to perform ROS - Chart review only  Airway Mallampati: II  TM Distance: >3 FB Neck ROM: Full    Dental  (+) Edentulous Upper, Edentulous Lower, Dental Advisory Given   Pulmonary neg pulmonary ROS, Current Smoker and Patient abstained from smoking.,    Pulmonary exam normal breath sounds clear to auscultation       Cardiovascular hypertension, Pt. on medications and Pt. on home beta blockers Normal cardiovascular exam Rhythm:Regular Rate:Normal     Neuro/Psych PSYCHIATRIC DISORDERS Anxiety negative neurological ROS     GI/Hepatic negative GI ROS, Neg liver ROS,   Endo/Other  negative endocrine ROS  Renal/GU negative Renal ROS  negative genitourinary   Musculoskeletal negative musculoskeletal ROS (+)   Abdominal   Peds  Hematology negative hematology ROS (+)   Anesthesia Other Findings Left breast CA  Reproductive/Obstetrics                             Anesthesia Physical  Anesthesia Plan  ASA: 2  Anesthesia Plan: General   Post-op Pain Management:    Induction: Intravenous  PONV Risk Score and Plan: 2 and Ondansetron, Dexamethasone, Midazolam and Treatment may vary due to age or medical condition  Airway Management Planned: LMA  Additional Equipment: None  Intra-op Plan:   Post-operative Plan: Extubation in OR  Informed Consent: I have reviewed the patients History and Physical, chart, labs and discussed the procedure including the risks, benefits and alternatives for the proposed anesthesia with the patient or authorized representative who has indicated his/her understanding and acceptance.     Dental advisory given  Plan Discussed with: CRNA  Anesthesia Plan Comments:        Anesthesia Quick Evaluation

## 2021-02-22 NOTE — Anesthesia Postprocedure Evaluation (Signed)
Anesthesia Post Note  Patient: Sharon Fischer  Procedure(s) Performed: Removal of bilateral breast expanders without implant placement (Bilateral: Breast)     Patient location during evaluation: PACU Anesthesia Type: General Level of consciousness: sedated and patient cooperative Pain management: pain level controlled Vital Signs Assessment: post-procedure vital signs reviewed and stable Respiratory status: spontaneous breathing Cardiovascular status: stable Anesthetic complications: no   No notable events documented.  Last Vitals:  Vitals:   02/22/21 1545 02/22/21 1559  BP:  122/67  Pulse: 76 78  Resp: 10 18  Temp:  36.9 C  SpO2: 97% 95%    Last Pain:  Vitals:   02/22/21 1559  TempSrc: Oral  PainSc: 0-No pain                 Nolon Nations

## 2021-02-22 NOTE — Op Note (Signed)
DATE OF OPERATION: 02/22/2021  LOCATION: [facility]  PREOPERATIVE DIAGNOSIS: bilateral mastectomies   POSTOPERATIVE DIAGNOSIS: Same  PROCEDURE: Removal of bilateral expanders to expedite radiation treatment  SURGEON: Lyndee Leo Sanger Gilverto Dileonardo, DO  ASSISTANT: Krista Blue, PA  EBL: 5 cc  CONDITION: Stable  COMPLICATIONS: None  INDICATION: The patient, Sharon Fischer, is a 55 y.o. female born on September 23, 1965, is here for treatment of breast cancer and acquired absence of breast.   PROCEDURE DETAILS:  The patient was seen prior to surgery and marked.  The IV antibiotics were given. The patient was taken to the operating room and given a general anesthetic. A standard time out was performed and all information was confirmed by those in the room. SCDs were placed.   The chest was prepped and draped.    Right:  The incision site was injected with local.  The #10 blade was used to make the incision at the previous incision site.  The bovie was used to dissect to the muscle. The ADM was incised and the expander was removed.  The pocket was irrigated with saline and antibiotic solution.  The ADM was not removed a it was well incorportaed.  A drain was placed and secured to the chest skin with a silk stitch.  The deep layer of the muscle was closed with the 3-0 PDS.  The remaining layers were closed with the 3-0 and 4-0 Monocryl.  Derma bond and steri strips were applied with a sterile dressing.   Left:  The incision site was injected with local.  The #10 blade was used to make the incision at the previous incision site.  The bovie was used to dissect to the muscle. The ADM was incised and the expander was removed.  The pocket was irrigated with saline and antibiotic solution.  The ADM was not removed as it was well incorporated.  I was also concerned about the thin skin.  A drain was placed and secured to the chest skin with a silk stitch.  The deep layer of the muscle was closed with  the 3-0 PDS.  The remaining layers were closed with the 3-0 and 4-0 Monocryl.  Derma bond and steri strips were applied with a sterile dressing.  The patient was allowed to wake up and taken to recovery room in stable condition at the end of the case. The family was notified at the end of the case.   The advanced practice practitioner (APP) assisted throughout the case.  The APP was essential in retraction and counter traction when needed to make the case progress smoothly.  This retraction and assistance made it possible to see the tissue plans for the procedure.  The assistance was needed for blood control, tissue re-approximation and assisted with closure of the incision site.

## 2021-02-22 NOTE — Interval H&P Note (Signed)
History and Physical Interval Note:  02/22/2021 12:46 PM  Sharon Fischer  has presented today for surgery, with the diagnosis of Malignant neoplasm of upper inner quadrant of left breast.  The various methods of treatment have been discussed with the patient and family. After consideration of risks, benefits and other options for treatment, the patient has consented to  Procedure(s) with comments: Removal of bilateral breast expanders without implant placement (Bilateral) - 90 minutes as a surgical intervention.  The patient's history has been reviewed, patient examined, no change in status, stable for surgery.  I have reviewed the patient's chart and labs.  Questions were answered to the patient's satisfaction.     Sharon Fischer

## 2021-02-22 NOTE — Transfer of Care (Signed)
Immediate Anesthesia Transfer of Care Note  Patient: Sharon Fischer  Procedure(s) Performed: Removal of bilateral breast expanders without implant placement (Bilateral: Breast)  Patient Location: PACU  Anesthesia Type:General  Level of Consciousness: drowsy  Airway & Oxygen Therapy: Patient Spontanous Breathing and Patient connected to face mask oxygen  Post-op Assessment: Report given to RN and Post -op Vital signs reviewed and stable  Post vital signs: Reviewed and stable  Last Vitals:  Vitals Value Taken Time  BP 130/57 02/22/21 1507  Temp    Pulse 79 02/22/21 1509  Resp 16 02/22/21 1508  SpO2 100 % 02/22/21 1509  Vitals shown include unvalidated device data.  Last Pain:  Vitals:   02/22/21 1306  TempSrc: Oral  PainSc: 5       Patients Stated Pain Goal: 9 ([id number])  Complications: No notable events documented.

## 2021-02-22 NOTE — Anesthesia Procedure Notes (Signed)
Procedure Name: LMA Insertion Date/Time: 02/22/2021 1:46 PM Performed by: Ezequiel Kayser, CRNA Pre-anesthesia Checklist: Patient identified, Emergency Drugs available, Suction available and Patient being monitored Patient Re-evaluated:Patient Re-evaluated prior to induction Oxygen Delivery Method: Circle System Utilized Preoxygenation: Pre-oxygenation with 100% oxygen Induction Type: IV induction Ventilation: Mask ventilation without difficulty LMA: LMA inserted LMA Size: 4.0 Number of attempts: 1 Airway Equipment and Method: Bite block Placement Confirmation: positive ETCO2 Tube secured with: Tape Dental Injury: Teeth and Oropharynx as per pre-operative assessment

## 2021-02-22 NOTE — Discharge Instructions (Addendum)
INSTRUCTIONS FOR AFTER SURGERY   You will likely have some questions about what to expect following your operation.  The following information will help you and your family understand what to expect when you are discharged from the hospital.  Following these guidelines will help ensure a smooth recovery and reduce risks of complications.  Postoperative instructions include information on: diet, wound care, medications and physical activity.  AFTER SURGERY Expect to go home after the procedure.  In some cases, you may need to spend one night in the hospital for observation.  DIET This surgery does not require a specific diet.  However, I have to mention that the healthier you eat the better your body can start healing. It is important to increasing your protein intake.  This means limiting the foods with added sugar.  Focus on fruits and vegetables and some meat. It is very important to drink water after your surgery.  If your urine is bright yellow, then it is concentrated, and you need to drink more water.  As a general rule after surgery, you should have 8 ounces of water every hour while awake.  If you find you are persistently nauseated or unable to take in liquids let us know.  NO TOBACCO USE or EXPOSURE.  This will slow your healing process and increase the risk of a wound.  WOUND CARE If you have a drain: Clean with baby wipes for 3-5 days and then you can shower.  If you have a binder you may remove it to shower and then put it back on. If you have steri-strips / tape directly attached to your skin leave them in place. It is OK to get these wet.  No baths, pools or hot tubs for two weeks. We close your incision to leave the smallest and best-looking scar. No ointment or creams on your incisions until given the go ahead.  Especially not Neosporin (Too many skin reactions with this one).  A few weeks after surgery you can use Mederma and start massaging the scar. We ask you to wear your binder or  sports bra for the first 6 weeks around the clock, including while sleeping. This provides added comfort and helps reduce the fluid accumulation at the surgery site.  ACTIVITY No heavy lifting until cleared by the doctor.  It is OK to walk and climb stairs. In fact, moving your legs is very important to decrease your risk of a blood clot.  It will also help keep you from getting deconditioned.  Every 1 to 2 hours get up and walk for 5 minutes. This will help with a quicker recovery back to normal.  Let pain be your guide so you don't do too much.  NO, you cannot do the spring cleaning and don't plan on taking care of anyone else.  This is your time for TLC.   WORK Everyone returns to work at different times. As a rough guide, most people take at least 1 - 2 weeks off prior to returning to work. If you need documentation for your job, bring the forms to your postoperative follow up visit.  DRIVING Arrange for someone to bring you home from the hospital.  You may be able to drive a few days after surgery but not while taking any narcotics or valium.  BOWEL MOVEMENTS Constipation can occur after anesthesia and while taking pain medication.  It is important to stay ahead for your comfort.  We recommend taking Milk of Magnesia (2 tablespoons; twice   a day) while taking the pain pills.  SEROMA This is fluid your body tried to put in the surgical site.  This is normal but if it creates excessive pain and swelling let us know.  It usually decreases in a few weeks.  MEDICATIONS and PAIN CONTROL At your preoperative visit for you history and physical you were given the following medications: An antibiotic: Start this medication when you get home and take according to the instructions on the bottle. Zofran 4 mg:  This is to treat nausea and vomiting.  You can take this every 6 hours as needed and only if needed. Norco (hydrocodone/acetaminophen) 5/325 mg:  This is only to be used after you have taken the  motrin or the tylenol. Every 8 hours as needed. Over the counter Medication to take: Ibuprofen (Motrin) 600 mg:  Take this every 6 hours.  If you have additional pain then take 500 mg of the tylenol.  Only take the Norco after you have tried these two. Miralax or stool softener of choice: Take this according to the bottle if you take the Norco.  WHEN TO CALL Call your surgeon's office if any of the following occur:  Fever 101 degrees F or greater  Excessive bleeding or fluid from the incision site.  Pain that increases over time without aid from the medications  Redness, warmth, or pus draining from incision sites  Persistent nausea or inability to take in liquids  Severe misshapen area that underwent the operation.  [facility]  What is the benefit of having a drain?  During surgery your tissue layers are separated.  This raw surface stimulates your body to fill the space with serous fluid.  This is normal but you don't want that fluid to collect and prevent healing.  A fluid collection can also become infected.  The Jackson-Pratt (JP) drain is used to eliminate this collection of fluid and allow the tissue to heal together.    Jackson-Pratt (JP) bulb    How to care for your drainage and suction unit at home Your drainage catheter will be connected to a collection device. The vacuum caused when the device is compressed allows drainage to collect in the device.    Wash your hands with soap and water before and after touching the system. Empty the JP drain every 12 hours once you get home from your procedure. Record the fluid amount on the record sheet included. Start with stripping the drain tube to push the clots or excess fluid to the bulb.  Do this by pinching the tube with one hand near your skin.  Then with the other hand squeeze the tubing and work it toward the bulb.  This should be done several times a day.  This may collapse the tube which will correct on its  own.   Use a safety pin to attach your collection device to your clothing so there is no tension on the insertion site.   If you have drainage at the skin insertion site, you can apply a gauze dressing and secure it with tape. If the drain falls out, apply a gauze dressing over the drain insertion site and secure with tape.   To empty the collection device:   Release the stopper on the top of the collection unit (bulb).  Pour contents into a measuring container such as a plastic medicine cup.  Record the day and amount of drainage on the attached sheet. This should be done at least   twice a day.    To compress the Jackson-Pratt Bulb:  Release the stopper at the top of the bulb. Squeeze the bulb tightly in your fist, squeezing air out of the bulb.  Replace the stopper while the bulb is compressed.  Be careful not to spill the contents when squeezing the bulb. The drainage will start bright red and turn to pink and then yellow with time. IMPORTANT: If the bulb is not squeezed before adding the stopper it will not draw out the fluid.  Care for the JP drain site and your skin daily:  You may shower three days after surgery. Secure the drain to a ribbon or cloth around your waist while showering so it does not pull out while showering. Be sure your hands are cleaned with soap and water. Use a clean wet cotton swab to clean the skin around the drain site.  Use another cotton swab to place Vaseline or antibiotic ointment on the skin around the drain.     Contact your physician if any of the following occur:  The fluid in the bulb becomes cloudy. Your temperature is greater than 101.4.  The incision opens. If you have drainage at the skin insertion site, you can apply a gauze dressing and secure it with tape. If the drain falls out, apply a gauze dressing over the drain insertion site and secure with tape.  You will usually have more drainage when you are active than while you rest or are  asleep. If the drainage increases significantly or is bloody call the physician                             Bring this record with you to each office visit Date  Drainage Volume  Date   Drainage volume                                                                                                                                                                                           Post Anesthesia Home Care Instructions  Activity: Get plenty of rest for the remainder of the day. A responsible individual must stay with you for 24 hours following the procedure.  For the next 24 hours, DO NOT: -Drive a car -Paediatric nurse -Drink alcoholic beverages -Take any medication unless instructed by your physician -Make any legal decisions or sign important papers.  Meals: Start with liquid foods such as gelatin or soup. Progress to regular foods as tolerated. Avoid greasy, spicy, heavy foods. If nausea and/or vomiting occur, drink only clear liquids until the nausea and/or vomiting subsides.  Call your physician if vomiting continues.  Special Instructions/Symptoms: Your throat may feel dry or sore from the anesthesia or the breathing tube placed in your throat during surgery. If this causes discomfort, gargle with warm salt water. The discomfort should disappear within 24 hours.  If you had a scopolamine patch placed behind your ear for the management of post- operative nausea and/or vomiting:  1. The medication in the patch is effective for 72 hours, after which it should be removed.  Wrap patch in a tissue and discard in the trash. Wash hands thoroughly with soap and water. 2. You may remove the patch earlier than 72 hours if you experience unpleasant side effects which may include dry mouth, dizziness or visual disturbances. 3. Avoid touching the patch. Wash your hands with soap and water after contact with the patch.

## 2021-02-23 ENCOUNTER — Encounter (HOSPITAL_BASED_OUTPATIENT_CLINIC_OR_DEPARTMENT_OTHER): Payer: Self-pay | Admitting: Plastic Surgery

## 2021-02-24 LAB — SURGICAL PATHOLOGY

## 2021-02-25 NOTE — Progress Notes (Signed)
Patient Care Team: Muse, Noel Journey., PA-C as PCP - General Brien Mates, RN as Oncology Nurse Navigator (Oncology) Mauro Kaufmann, RN as Oncology Nurse Navigator Rockwell Germany, RN as Oncology Nurse Navigator Nicholas Lose, MD as Consulting Physician (Hematology and Oncology) Jovita Kussmaul, MD as Consulting Physician (General Surgery)  DIAGNOSIS:    ICD-10-CM   1. Malignant neoplasm of upper-inner quadrant of left breast in female, estrogen receptor negative ([place])  C50.212    Z17.1       SUMMARY OF ONCOLOGIC HISTORY: Oncology History  Malignant neoplasm of upper-inner quadrant of left breast in female, estrogen receptor negative ([place])  01/2020 Initial Diagnosis   Palpable right breast mass growing since July 2021.  Mammogram revealed 2.2 cm right breast mass with a 0.8 cm satellite lesion.  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   03/31/2020 Cancer Staging   Staging form: Breast, AJCC 8th Edition - Clinical stage from 03/31/2020: Stage IIIB (cT2, cN1(f), cM0, G3, ER-, PR-, HER2-) - Signed by Nicholas Lose, MD on 04/02/2020   04/09/2020 - 08/06/2020 Chemotherapy    Patient is on Treatment Plan: BREAST AC Q21D / CARBOPLATIN D1 + PACLITAXEL D1,8,15 Q21D, Keytruda discontinued for pneumonitis and Taxol discontinued after 4 cycles because of failure to thrive       12/16/2020 Surgery   Bilateral mastectomies   Left mastectomy: Intraductal papilloma, UDH Right mastectomy: No residual carcinoma was 1/3 lymph nodes positive ER negative, PR +20%, HER2 2+ by IHC, positive by FISH ratio 2.03   02/05/2021 -  Chemotherapy      Patient is on Antibody Plan: BREAST TRASTUZUMAB Q21D       CHIEF COMPLIANT: Follow-up of breast cancer  INTERVAL HISTORY: Sharon Fischer is a 55 y.o. with above-mentioned history of breast cancer who completed neoadjuvant  chemotherapy and bilateral mastectomies. She presents to the clinic today for follow-up.  She complains of generalized fatigue and weakness inability to sit or stand for any long period of time.  She cannot lift objects because of her muscle wasting.  She is tolerating Herceptin fairly well.  ALLERGIES:  has No Known Allergies.  MEDICATIONS:  Current Outpatient Medications  Medication Sig Dispense Refill   acetaminophen (TYLENOL) 325 MG tablet Take 325 mg by mouth every 6 (six) hours as needed (FOR PAIN).     albuterol (VENTOLIN HFA) 108 (90 Base) MCG/ACT inhaler Inhale 1-2 puffs into the lungs every 4 (four) hours as needed for wheezing or shortness of breath. (Patient not taking: No sig reported) 8 g 2   carvedilol (COREG) 3.125 MG tablet Take 3.125 mg by mouth 2 (two) times daily.     dexamethasone (DECADRON) 1 MG tablet Take 1 tablet (1 mg total) by mouth daily. (Patient taking differently: Take 1 mg by mouth in the morning.) 30 tablet 2   hydrochlorothiazide (HYDRODIURIL) 12.5 MG tablet Take by mouth.     No current facility-administered medications for this visit.    PHYSICAL EXAMINATION: ECOG PERFORMANCE STATUS: 1 - Symptomatic but completely ambulatory  Vitals:   02/26/21 0918  BP: 134/64  Pulse: 72  Resp: 19  Temp: (!) 97.2 F (36.2 C)  SpO2: 100%   Filed Weights   02/26/21 0918  Weight: 138 lb 3.2 oz (62.7 kg)     LABORATORY DATA:  I have reviewed the data as listed CMP Latest  Ref Rng & Units 02/16/2021 12/11/2020 10/23/2020  Glucose 70 - 99 mg/dL 58(L) 89 59(L)  BUN 6 - 20 mg/dL _0 Creatinine 0.44 - 1.00 mg/dL 0.60 0.68 0.70  Sodium 135 - 145 mmol/L 138 141 139  Potassium 3.5 - 5.1 mmol/L 4.0 3.3(L) 2.9(L)  Chloride 98 - 111 mmol/L 108 105 107  CO2 22 - 32 mmol/L _1 Calcium 8.9 - 10.3 mg/dL 9.4 9.5 8.5(L)  Total Protein 6.5 - 8.1 g/dL - - 5.5(L)  Total Bilirubin 0.3 - 1.2 mg/dL - - 0.2(L)  Alkaline Phos 38 - 126 U/L - - 85  AST 15 - 41 U/L - - 48(H)   ALT 0 - 44 U/L - - 32    Lab Results  Component Value Date   WBC 6.3 12/11/2020   HGB 13.0 12/11/2020   HCT 40.7 12/11/2020   MCV 94.4 12/11/2020   PLT 235 12/11/2020   NEUTROABS 2.0 10/23/2020    ASSESSMENT & PLAN:  Malignant neoplasm of upper-inner quadrant of left breast in female, estrogen receptor negative ([place]) 01/2020: Palpable right breast mass growing since July 2021.  Mammogram revealed 2.2 cm right breast mass with a 0.8 cm satellite lesion.  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   Treatment plan  1. Neoadjuvant chemotherapy with Adriamycin and Cytoxan pembrolizumab 4 followed by Taxol weekly 4 with carboplatin pembrolizumab every 3 weeks x3 (chemo discontinued due to toxicities) Keytruda discontinued because of pneumonitis 2. bilateral mastectomies 12/16/2020: Left mastectomy intraductal papilloma, right mastectomy, no residual cancer in the breast 1/3 lymph nodes positive, ER negative, PR positive, HER2 2+ by IHC, FISH positive ratio 2.03, copy #6.2 3. Followed by adjuvant radiation therapy 4.  Herceptin every 3 weeks for 1 year started 02/05/2021 -------------------------------------------------------------------------------------------------------------------------------------- 02/22/2021: Removal of implants Return to clinic every 3 weeks for Herceptin every 6 weeks to follow-up with me. She applied for disability because of her profound weight loss and generalized weakness which prevents her from doing any work. Apparently its been denied and therefore she may try to appeal it.   No orders of the defined types were placed in this encounter.  The patient has a good understanding of the overall plan. she agrees with it. she will call with any problems that may develop before the next visit here.  Total time spent: 30 mins including face to face time  and time spent for planning, charting and coordination of care  Rulon Eisenmenger, MD, MPH 02/26/2021  I, Thana Ates, am acting as scribe for Dr. Nicholas Lose.  I have reviewed the above documentation for accuracy and completeness, and I agree with the above.

## 2021-02-26 ENCOUNTER — Inpatient Hospital Stay (HOSPITAL_BASED_OUTPATIENT_CLINIC_OR_DEPARTMENT_OTHER): Payer: Medicaid Other | Admitting: Hematology and Oncology

## 2021-02-26 ENCOUNTER — Other Ambulatory Visit: Payer: Self-pay

## 2021-02-26 ENCOUNTER — Inpatient Hospital Stay: Payer: Medicaid Other | Attending: Hematology and Oncology

## 2021-02-26 DIAGNOSIS — Z171 Estrogen receptor negative status [ER-]: Secondary | ICD-10-CM | POA: Insufficient documentation

## 2021-02-26 DIAGNOSIS — C50212 Malignant neoplasm of upper-inner quadrant of left female breast: Secondary | ICD-10-CM

## 2021-02-26 DIAGNOSIS — Z9013 Acquired absence of bilateral breasts and nipples: Secondary | ICD-10-CM | POA: Insufficient documentation

## 2021-02-26 DIAGNOSIS — R627 Adult failure to thrive: Secondary | ICD-10-CM | POA: Insufficient documentation

## 2021-02-26 DIAGNOSIS — Z5112 Encounter for antineoplastic immunotherapy: Secondary | ICD-10-CM | POA: Insufficient documentation

## 2021-02-26 DIAGNOSIS — Z7952 Long term (current) use of systemic steroids: Secondary | ICD-10-CM | POA: Diagnosis not present

## 2021-02-26 DIAGNOSIS — Z79899 Other long term (current) drug therapy: Secondary | ICD-10-CM | POA: Diagnosis not present

## 2021-02-26 MED ORDER — DIPHENHYDRAMINE HCL 25 MG PO CAPS
50.0000 mg | ORAL_CAPSULE | Freq: Once | ORAL | Status: AC
  Administered 2021-02-26: 50 mg via ORAL
  Filled 2021-02-26: qty 2

## 2021-02-26 MED ORDER — ACETAMINOPHEN 325 MG PO TABS
650.0000 mg | ORAL_TABLET | Freq: Once | ORAL | Status: AC
  Administered 2021-02-26: 650 mg via ORAL
  Filled 2021-02-26: qty 2

## 2021-02-26 MED ORDER — TRASTUZUMAB-HYALURONIDASE-OYSK 600-10000 MG-UNT/5ML ~~LOC~~ SOLN
600.0000 mg | Freq: Once | SUBCUTANEOUS | Status: AC
  Administered 2021-02-26: 600 mg via SUBCUTANEOUS
  Filled 2021-02-26: qty 5

## 2021-02-26 NOTE — Patient Instructions (Signed)
[facility] MEDICAL [facility]COLOG[facility]  Discharge Instructions: Thank you for choosing [facility] to provide your oncology and hematology care.   If you have a lab appointment with the Cancer Center, please go directly to the Cancer Center and check in at the registration area.   Wear comfortable clothing and clothing appropriate for easy access to any Portacath or PICC line.   We strive to give you quality time with your provider. You may need to reschedule your appointment if you arrive late (15 or more minutes).  Arriving late affects you and other patients whose appointments are after yours.  Also, if you miss three or more appointments without notifying the office, you may be dismissed from the clinic at the provider's discretion.      For prescription refill requests, have your pharmacy contact our office and allow 72 hours for refills to be completed.    Today you received the following chemotherapy and/or immunotherapy agents herceptin hylecta    To help prevent nausea and vomiting after your treatment, we encourage you to take your nausea medication as directed.  BELOW ARE SYMPTOMS THAT SHOULD BE REPORTED IMMEDIATELY: . *FEVER GREATER THAN 100.4 F (38 C) OR HIGHER . *CHILLS OR SWEATING . *NAUSEA AND VOMITING THAT IS NOT CONTROLLED WITH YOUR NAUSEA MEDICATION . *UNUSUAL SHORTNESS OF BREATH . *UNUSUAL BRUISING OR BLEEDING . *URINARY PROBLEMS (pain or burning when urinating, or frequent urination) . *BOWEL PROBLEMS (unusual diarrhea, constipation, pain near the anus) . TENDERNESS IN MOUTH AND THROAT WITH OR WITHOUT PRESENCE OF ULCERS (sore throat, sores in mouth, or a toothache) . UNUSUAL RASH, SWELLING OR PAIN  . UNUSUAL VAGINAL DISCHARGE OR ITCHING   Items with * indicate a potential emergency and should be followed up as soon as possible or go to the Emergency Department if any problems should occur.  Please show the CHEMOTHERAPY ALERT CARD or IMMUNOTHERAPY  ALERT CARD at check-in to the Emergency Department and triage nurse.  Should you have questions after your visit or need to cancel or reschedule your appointment, please contact [facility]  Dept: [phone]  and follow the prompts.  Office hours are 8:00 a.m. to 4:30 p.m. Monday - Friday. Please note that voicemails left after 4:00 p.m. may not be returned until the following business day.  We are closed weekends and major holidays. You have access to a nurse at all times for urgent questions. Please call the main number to the clinic Dept: [phone] and follow the prompts.   For any non-urgent questions, you may also contact your provider using MyChart. We now offer e-Visits for anyone 18 and older to request care online for non-urgent symptoms. For details visit [email or web address].   Also download the MyChart app! Go to the app store, search "MyChart", open the app, select [facility], and log in with your MyChart username and password.  Due to Covid, a mask is required upon entering the hospital/clinic. If you do not have a mask, one will be given to you upon arrival. For doctor visits, patients may have 1 support person aged 18 or older with them. For treatment visits, patients cannot have anyone with them due to current Covid guidelines and our immunocompromised population.   

## 2021-02-26 NOTE — Progress Notes (Signed)
Patient waited 30 minute post obs for her 2nd treatment with no issues.

## 2021-02-26 NOTE — Assessment & Plan Note (Signed)
01/2020:Palpable right breast mass growing since July 2021. Mammogram revealed 2.2 cm right breast mass with a 0.8 cm satellite lesion. 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  Treatment plan 1. 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 bilateral mastectomies 12/16/2020: Left mastectomy intraductal papilloma, right mastectomy, no residual cancer in the breast 1/3 lymph nodes positive, ER negative, PR positive, HER2 2+ by IHC, FISH positive ratio 2.03, copy #6.2 3. Followed by adjuvant radiation therapy 4.  Herceptin every 3 weeks for 1 year started 02/05/2021 -------------------------------------------------------------------------------------------------------------------------------------- 02/22/2021: Removal of implants Return to clinic every 3 weeks for Herceptin every 6 weeks to follow-up with me.

## 2021-03-01 ENCOUNTER — Encounter: Payer: Self-pay | Admitting: *Deleted

## 2021-03-03 NOTE — Progress Notes (Signed)
Patient is a 55 year old female with PMH significant for right-sided breast cancer and left breast intraductal papilloma s/p bilateral immediate breast reconstruction with placement of acellular dermal matrix and tissue expanders 12/16/2020 by Dr. Marla Roe who then had difficulty tolerating expansion and made decision to have removal of bilateral expanders performed 02/22/2021 to expedite radiation treatment who presents to clinic for postoperative encounter.  Today, patient is doing well.  She states that she has not had any fevers, chills, redness, swelling, or significant pain symptoms.  She states that most of her discomfort is at the Velcro placement from her breast binder.  I informed her that she can transition to sports bra and she was relieved.  She also endorses discomfort at sites of JP drains.  She is draining less than 10 cc daily from each side.  She states that she feels considerably improved from when she had her expanders placed.  On physical exam, honeycomb dressings remain intact, no evidence of moisture.  Underlying area is without any redness or swelling.  She is nontender over chest wall.  Steri-Strips intact over incisions.  JP drains were removed without complication.  Placed 2 x 2 gauze with Vaseline over drain sites.  There was no surrounding erythema or induration otherwise concerning for infection.  She is clinically well-appearing.  Recommending daily Vaseline and gauze to drain sites.  Informed patient that if her honeycomb dressings fall off, that is fine.  Encouraged her to keep the Steri-Strips in place.  Recommending daily compressive sports bra and continued activity modifications.  Patient already has her next postoperative appointment scheduled for 03/20/2021.

## 2021-03-05 ENCOUNTER — Telehealth: Payer: Self-pay | Admitting: Emergency Medicine

## 2021-03-05 ENCOUNTER — Ambulatory Visit (INDEPENDENT_AMBULATORY_CARE_PROVIDER_SITE_OTHER): Payer: Medicaid Other | Admitting: Physician Assistant

## 2021-03-05 ENCOUNTER — Other Ambulatory Visit: Payer: Self-pay

## 2021-03-05 DIAGNOSIS — Z171 Estrogen receptor negative status [ER-]: Secondary | ICD-10-CM

## 2021-03-05 DIAGNOSIS — C50212 Malignant neoplasm of upper-inner quadrant of left female breast: Secondary | ICD-10-CM

## 2021-03-05 NOTE — Telephone Encounter (Signed)
[facility]-[id number] - TREATMENT OF ESTABLISHED CHEMOTHERAPY-INDUCED NEUROPATHY WITH N-PALMITOYLETHANOLAMIDE, A CANNABIMIMETIC NUTRACEUTICAL: A RANDOMIZED DOUBLE-BLIND PHASE II PILOT TRIAL  Contacted patient to f/u on potential interest in study (was being followed by [facility]).  No answer, left VM with contact info requesting patient to call back if she is interested in neuropathy drug trial.  Sharon Guiles '[name], RN, BSN Clinical Research Nurse I 03/05/21 12:56 PM

## 2021-03-09 ENCOUNTER — Other Ambulatory Visit: Payer: Self-pay | Admitting: *Deleted

## 2021-03-09 MED ORDER — GABAPENTIN 300 MG PO CAPS: 300.0000 mg | ORAL_CAPSULE | Freq: Two times a day (BID) | ORAL | 1 refills | Status: DC

## 2021-03-09 NOTE — Progress Notes (Signed)
Pt called requesting refill for Gabapentin 300 mg BID.  Pt states she is experiencing numbness and tingling in bilateral toes and fingers.  Per MD okay to refill prescription.

## 2021-03-15 ENCOUNTER — Encounter: Payer: Self-pay | Admitting: *Deleted

## 2021-03-17 ENCOUNTER — Telehealth: Payer: Self-pay | Admitting: Hematology and Oncology

## 2021-03-17 ENCOUNTER — Other Ambulatory Visit: Payer: Self-pay | Admitting: *Deleted

## 2021-03-17 DIAGNOSIS — R0602 Shortness of breath: Secondary | ICD-10-CM

## 2021-03-17 NOTE — Telephone Encounter (Signed)
Rescheduled appointment per melanie. Patient is aware.  

## 2021-03-17 NOTE — Progress Notes (Signed)
Per PA team, pt insurance takes 72 hours for PA of CT angio.  MD notified and verbal orders received for pt to obtain stat 2 view chest xray and follow up with Wilber Bihari, NP.  Pt called and states she is not able to come today but will be able to come in tomorrow at 11:30 am for chest xray and f/u with NP after.  Pt educated to go to the nearest ED if [facility] becomes worse.  Pt verbalized understanding.

## 2021-03-17 NOTE — Progress Notes (Signed)
Received call from pt with complaint of shortness of breath x1 week.  Pt states she can't walk to the mailbox without taking a break.  Per MD pt needing STAT CT angio to rule out PE.  Orders placed and high priority message sent for PA approval.

## 2021-03-18 ENCOUNTER — Other Ambulatory Visit: Payer: Self-pay

## 2021-03-18 ENCOUNTER — Encounter: Payer: Self-pay | Admitting: Adult Health

## 2021-03-18 ENCOUNTER — Ambulatory Visit (HOSPITAL_COMMUNITY): Payer: Medicaid Other

## 2021-03-18 ENCOUNTER — Ambulatory Visit (HOSPITAL_COMMUNITY)
Admission: RE | Admit: 2021-03-18 | Discharge: 2021-03-18 | Disposition: A | Discharge status: Home / Self Care | Payer: Medicaid Other | Source: Ambulatory Visit | Attending: Hematology and Oncology | Admitting: Hematology and Oncology

## 2021-03-18 ENCOUNTER — Inpatient Hospital Stay: Payer: Medicaid Other | Attending: Hematology and Oncology | Admitting: Adult Health

## 2021-03-18 VITALS — BP 148/74 | HR 94 | Temp 97.5°F | Resp 19 | Ht 67.0 in | Wt 133.4 lb

## 2021-03-18 DIAGNOSIS — Z5112 Encounter for antineoplastic immunotherapy: Secondary | ICD-10-CM | POA: Diagnosis not present

## 2021-03-18 DIAGNOSIS — Z9013 Acquired absence of bilateral breasts and nipples: Secondary | ICD-10-CM | POA: Diagnosis not present

## 2021-03-18 DIAGNOSIS — Z9221 Personal history of antineoplastic chemotherapy: Secondary | ICD-10-CM | POA: Diagnosis not present

## 2021-03-18 DIAGNOSIS — C50212 Malignant neoplasm of upper-inner quadrant of left female breast: Secondary | ICD-10-CM | POA: Diagnosis present

## 2021-03-18 DIAGNOSIS — Z79899 Other long term (current) drug therapy: Secondary | ICD-10-CM | POA: Insufficient documentation

## 2021-03-18 DIAGNOSIS — R0602 Shortness of breath: Secondary | ICD-10-CM | POA: Insufficient documentation

## 2021-03-18 DIAGNOSIS — F17210 Nicotine dependence, cigarettes, uncomplicated: Secondary | ICD-10-CM | POA: Diagnosis not present

## 2021-03-18 DIAGNOSIS — Z171 Estrogen receptor negative status [ER-]: Secondary | ICD-10-CM

## 2021-03-18 DIAGNOSIS — I10 Essential (primary) hypertension: Secondary | ICD-10-CM | POA: Insufficient documentation

## 2021-03-18 NOTE — Assessment & Plan Note (Signed)
01/2020:Palpable right breast mass growing since July 2021. Mammogram revealed 2.2 cm right breast mass with a 0.8 cm satellite lesion. 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  Treatment plan 1. 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 bilateral mastectomies 12/16/2020: Left mastectomy intraductal papilloma, right mastectomy, no residual cancer in the breast 1/3 lymph nodes positive, ER negative, PR positive, HER2 2+ by IHC, FISH positive ratio 2.03, copy #6.2 3. Followed by adjuvant radiation therapy 4.  Herceptin every 3 weeks for 1 year started 02/05/2021 --------------------------------------------------------------------------------------------------------------------------------------  Sharon Fischer has shortness of breath with an unclear etiology.  I reviewed with her that there are a couple of things she is at risk for.  First, her chest xray was normal.  There were no acute issues including cardiomegaly, or fluid on her lungs.  I reviewed that she has a couple of risk factors for heart disease and this being a cardiac related pain which can lead to a heart attack.  Her EKG showed NSR without ST elevation.    The other possibility is that she could have a pulmonary embolus.  She has a CT angiogram scheduled tomorrow at 2pm.  This will be finished prior to her treatment with Herceptin.    I discussed with her that we will get the CTA tomorrow.  Should the pain worsen between now and then she was instructed to go to the ER.

## 2021-03-18 NOTE — Progress Notes (Signed)
[facility] Follow up:    Sharon Simmonds., PA-C [facility] [address] [place] [address]   DIAGNOSIS: Cancer Staging Malignant neoplasm of upper-inner quadrant of left breast in female, estrogen receptor negative ([place]) Staging form: Breast, AJCC 8th Edition - Clinical stage from 03/31/2020: Stage IIIB (cT2, cN1(f), cM0, G3, ER-, PR-, HER2-) - Signed by Nicholas Lose, MD on 04/02/2020 Stage prefix: Initial diagnosis Method of lymph node assessment: Core biopsy Histologic grading system: 3 grade system   SUMMARY OF ONCOLOGIC HISTORY: Oncology History  Malignant neoplasm of upper-inner quadrant of left breast in female, estrogen receptor negative ([place])  01/2020 Initial Diagnosis   Palpable right breast mass growing since July 2021.  Mammogram revealed 2.2 cm right breast mass with a 0.8 cm satellite lesion.  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   03/31/2020 Cancer Staging   Staging form: Breast, AJCC 8th Edition - Clinical stage from 03/31/2020: Stage IIIB (cT2, cN1(f), cM0, G3, ER-, PR-, HER2-) - Signed by Nicholas Lose, MD on 04/02/2020   04/09/2020 - 08/06/2020 Chemotherapy    Patient is on Treatment Plan: BREAST AC Q21D / CARBOPLATIN D1 + PACLITAXEL D1,8,15 Q21D, Keytruda discontinued for pneumonitis and Taxol discontinued after 4 cycles because of failure to thrive       12/16/2020 Surgery   Bilateral mastectomies   Left mastectomy: Intraductal papilloma, UDH Right mastectomy: No residual carcinoma was 1/3 lymph nodes positive ER negative, PR +20%, HER2 2+ by IHC, positive by FISH ratio 2.03   02/05/2021 -  Chemotherapy      Patient is on Antibody Plan: BREAST TRASTUZUMAB Q21D       CURRENT THERAPY: herceptin  INTERVAL HISTORY: Sharon Fischer 56 y.o. female returns for urgent evaluation of shortness of breath and feeling like  weights are against Sharon Fischer chest.  This began about a week ago.  Sharon Fischer says Sharon Fischer first noticed it when Sharon Fischer restarted gabapentin, Sharon Fischer since stopped the gabapentin, however the tight feeling remains.  Sharon Fischer is a current every day smoker, has HTN diagnosis.  Sharon Fischer has no FH of heart disease.  Sharon Fischer underwent a chest xray prior to Sharon Fischer visit today which was unrevealing.     Patient Active Problem List   Diagnosis Date Noted   Breast cancer ([place]) 12/16/2020   Port-A-Cath in place 05/20/2020   Family history of throat cancer    Malignant neoplasm of upper-inner quadrant of left breast in female, estrogen receptor negative ([place]) 03/31/2020    has No Known Allergies.  MEDICAL HISTORY: Past Medical History:  Diagnosis Date   Breast cancer ([place]) 03/2020   Family history of throat cancer    Hypertension     SURGICAL HISTORY: Past Surgical History:  Procedure Laterality Date   AXILLARY SENTINEL NODE BIOPSY Right 12/16/2020   Procedure: TARGETED RIGHT SENTINEL LYMPH NODE DISSECTION;  Surgeon: Jovita Kussmaul, MD;  Location: [facility];  Service: General;  Laterality: Right;   BREAST CYST EXCISION Left 12/16/2020   Procedure: CYST EXCISION LEFT AXILLA;  Surgeon: Jovita Kussmaul, MD;  Location: [facility];  Service: General;  Laterality: Left;   BREAST RECONSTRUCTION WITH PLACEMENT OF TISSUE EXPANDER AND FLEX HD (ACELLULAR HYDRATED DERMIS) Bilateral 12/16/2020   Procedure: IMMEDIATE BILATERAL BREAST RECONSTRUCTION WITH PLACEMENT OF TISSUE EXPANDER AND FLEX HD (ACELLULAR HYDRATED DERMIS);  Surgeon: Wallace Going, DO;  Location:  [facility];  Service: Plastics;  Laterality: Bilateral;   IR IMAGING GUIDED PORT INSERTION  03/30/2020   PORT-A-CATH REMOVAL Right 12/16/2020   Procedure: REMOVAL PORT-A-CATH;  Surgeon: Jovita Kussmaul, MD;  Location: [facility];  Service: General;  Laterality: Right;   REMOVAL OF BILATERAL TISSUE EXPANDERS WITH PLACEMENT OF BILATERAL BREAST IMPLANTS Bilateral 02/22/2021   Procedure: Removal of bilateral breast  expanders without implant placement;  Surgeon: Wallace Going, DO;  Location: [facility];  Service: Plastics;  Laterality: Bilateral;   TOTAL MASTECTOMY Bilateral 12/16/2020   Procedure: TOTAL MASTECTOMY;  Surgeon: Jovita Kussmaul, MD;  Location: [facility];  Service: General;  Laterality: Bilateral;    SOCIAL HISTORY: Social History   Socioeconomic History   Marital status: Single    Spouse name: Not on file   Number of children: Not on file   Years of education: Not on file   Highest education level: Not on file  Occupational History   Not on file  Tobacco Use   Smoking status: Every Day    Packs/day: 0.25    Years: 25.00    Pack years: 6.25    Types: Cigarettes   Smokeless tobacco: Never   Tobacco comments:    7-8 cigs /day  Substance and Sexual Activity   Alcohol use: Never   Drug use: Never   Sexual activity: Not on file  Other Topics Concern   Not on file  Social History Narrative   Not on file   Social Determinants of Health   Financial Resource Strain: Not on file  Food Insecurity: No Food Insecurity   Worried About Running Out of Food in the Last Year: Never true   Ran Out of Food in the Last Year: Never true  Transportation Needs: No Transportation Needs   Lack of Transportation (Medical): No   Lack of Transportation (Non-Medical): No  Physical Activity: Not on file  Stress: Not on file  Social Connections: Not on file  Intimate Partner Violence: Not on file    FAMILY HISTORY: Family History  Problem Relation Age of Onset   Throat cancer Maternal Uncle        dx >50, smoker   Throat cancer Maternal Uncle        dx >50, smoker   Cancer Cousin        unknown type, dx >50 (paternal first cousin)   Cancer Cousin        unknown type (paternal first cousin)    Review of Systems  Constitutional:  Negative for appetite change, chills, fatigue, fever and unexpected weight change.  HENT:   Negative for hearing loss, lump/mass and trouble  swallowing.   Eyes:  Negative for eye problems and icterus.  Respiratory:  Positive for chest tightness. Negative for cough and shortness of breath.   Cardiovascular:  Negative for chest pain, leg swelling and palpitations.  Gastrointestinal:  Negative for abdominal distention, abdominal pain, constipation, diarrhea, nausea and vomiting.  Endocrine: Negative for hot flashes.  Genitourinary:  Negative for difficulty urinating.   Musculoskeletal:  Negative for arthralgias.  Skin:  Negative for itching and rash.  Neurological:  Negative for dizziness, extremity weakness, headaches and numbness.  Hematological:  Negative for adenopathy. Does not bruise/bleed easily.  Psychiatric/Behavioral:  Negative for depression. The patient is not nervous/anxious.      PHYSICAL EXAMINATION  ECOG PERFORMANCE STATUS: 1 - Symptomatic but completely ambulatory  Vitals:   03/18/21 1207  BP: (!) 148/74  Pulse: 94  Resp: 19  Temp: (!) 97.5 F (36.4 C)  SpO2: 99%    Physical Exam Constitutional:      General: Sharon Fischer is not in acute distress.    Appearance: Normal appearance. Sharon Fischer is not toxic-appearing.  HENT:     Head: Normocephalic and atraumatic.  Eyes:     General: No scleral icterus. Cardiovascular:     Rate and Rhythm: Normal rate and regular rhythm.     Pulses: Normal pulses.     Heart sounds: Normal heart sounds.  Pulmonary:     Effort: Pulmonary effort is normal.     Breath sounds: Normal breath sounds.  Abdominal:     General: Abdomen is flat. Bowel sounds are normal. There is no distension.     Palpations: Abdomen is soft.     Tenderness: There is no abdominal tenderness.  Musculoskeletal:        General: No swelling.     Cervical back: Neck supple.  Lymphadenopathy:     Cervical: No cervical adenopathy.  Skin:    General: Skin is warm and dry.     Findings: No rash.  Neurological:     General: No focal deficit present.     Mental Status: Sharon Fischer is alert.  Psychiatric:         Mood and Affect: Mood normal.        Behavior: Behavior normal.    LABORATORY DATA:  CBC    Component Value Date/Time   WBC 6.3 12/11/2020 1200   RBC 4.31 12/11/2020 1200   HGB 13.0 12/11/2020 1200   HGB 9.2 (L) 10/23/2020 0929   HCT 40.7 12/11/2020 1200   PLT 235 12/11/2020 1200   PLT 267 10/23/2020 0929   MCV 94.4 12/11/2020 1200   MCH 30.2 12/11/2020 1200   MCHC 31.9 12/11/2020 1200   RDW 16.7 (H) 12/11/2020 1200   LYMPHSABS 2.4 10/23/2020 0929   MONOABS 0.7 10/23/2020 0929   EOSABS 0.3 10/23/2020 0929   BASOSABS 0.1 10/23/2020 0929    CMP     Component Value Date/Time   NA 138 02/16/2021 1016   K 4.0 02/16/2021 1016   CL 108 02/16/2021 1016   CO2 22 02/16/2021 1016   GLUCOSE 58 (L) 02/16/2021 1016   BUN 8 02/16/2021 1016   CREATININE 0.60 02/16/2021 1016   CREATININE 0.70 10/23/2020 0929   CALCIUM 9.4 02/16/2021 1016   PROT 5.5 (L) 10/23/2020 0929   ALBUMIN 3.1 (L) 10/23/2020 0929   AST 48 (H) 10/23/2020 0929   ALT 32 10/23/2020 0929   ALKPHOS 85 10/23/2020 0929   BILITOT 0.2 (L) 10/23/2020 0929   GFRNONAA >60 02/16/2021 1016   GFRNONAA >60 10/23/2020 0929   GFRAA  02/05/2007 1044    >60        The eGFR has been calculated using the MDRD equation. This calculation has not been validated in all clinical      RADIOGRAPHIC STUDIES:  DG Chest 2 View  Result Date: 03/18/2021 CLINICAL DATA:  Shortness of breath EXAM: CHEST - 2 VIEW COMPARISON:  08/12/2020 FINDINGS: Interval removal of right-sided chest port. Heart size is normal. Atherosclerotic calcification of the aortic knob. No focal airspace consolidation, pleural effusion, or pneumothorax. Interval postsurgical changes of bilateral mastectomy. IMPRESSION: No active cardiopulmonary disease. Electronically Signed   By: Davina Poke D.O.   On: 03/18/2021 11:48     PATHOLOGY:     ASSESSMENT and THERAPY PLAN:   Malignant neoplasm of upper-inner quadrant of left breast in  female, estrogen  receptor negative ([place]) 01/2020: Palpable right breast mass growing since July 2021.  Mammogram revealed 2.2 cm right breast mass with a 0.8 cm satellite lesion.  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   Treatment plan  1. Neoadjuvant chemotherapy with Adriamycin and Cytoxan pembrolizumab 4 followed by Taxol weekly 4 with carboplatin pembrolizumab every 3 weeks x3 (chemo discontinued due to toxicities) Keytruda discontinued because of pneumonitis 2. bilateral mastectomies 12/16/2020: Left mastectomy intraductal papilloma, right mastectomy, no residual cancer in the breast 1/3 lymph nodes positive, ER negative, PR positive, HER2 2+ by IHC, FISH positive ratio 2.03, copy #6.2 3. Followed by adjuvant radiation therapy 4.  Herceptin every 3 weeks for 1 year started 02/05/2021 --------------------------------------------------------------------------------------------------------------------------------------  Sharon Fischer has shortness of breath with an unclear etiology.  I reviewed with Sharon Fischer that there are a couple of things Sharon Fischer is at risk for.  First, Sharon Fischer chest xray was normal.  There were no acute issues including cardiomegaly, or fluid on Sharon Fischer lungs.  I reviewed that Sharon Fischer has a couple of risk factors for heart disease and this being a cardiac related pain which can lead to a heart attack.  Sharon Fischer EKG showed NSR without ST elevation.    The other possibility is that Sharon Fischer could have a pulmonary embolus.  Sharon Fischer has a CT angiogram scheduled tomorrow at 2pm.  This will be finished prior to Sharon Fischer treatment with Herceptin.    I discussed with Sharon Fischer that we will get the CTA tomorrow.  Should the pain worsen between now and then Sharon Fischer was instructed to go to the ER.    I reviewed the above with Dr. Lindi Adie who is in agreement with the assessment and plan.    All questions were answered. The patient knows to  call the clinic with any problems, questions or concerns. We can certainly see the patient much sooner if necessary.  Total encounter time: 40 minutes in face to face visit time, chart review, lab review, order entry, care coordination, and documentation of the encounter.Wilber Bihari, NP 03/18/21 8:40 PM Medical Oncology and Hematology [facility] [place], [place] [address] Tel. [phone]    Fax. [phone]  .

## 2021-03-19 ENCOUNTER — Ambulatory Visit: Payer: Medicaid Other

## 2021-03-19 ENCOUNTER — Other Ambulatory Visit: Payer: Self-pay

## 2021-03-19 ENCOUNTER — Other Ambulatory Visit: Payer: Self-pay | Admitting: Hematology & Oncology

## 2021-03-19 ENCOUNTER — Inpatient Hospital Stay: Payer: Medicaid Other

## 2021-03-19 ENCOUNTER — Ambulatory Visit (HOSPITAL_COMMUNITY)
Admission: RE | Admit: 2021-03-19 | Discharge: 2021-03-19 | Disposition: A | Discharge status: Home / Self Care | Payer: Medicaid Other | Source: Ambulatory Visit | Attending: Hematology and Oncology | Admitting: Hematology and Oncology

## 2021-03-19 VITALS — BP 131/68 | HR 89 | Temp 98.3°F | Resp 18

## 2021-03-19 DIAGNOSIS — C50212 Malignant neoplasm of upper-inner quadrant of left female breast: Secondary | ICD-10-CM

## 2021-03-19 DIAGNOSIS — Z5112 Encounter for antineoplastic immunotherapy: Secondary | ICD-10-CM | POA: Diagnosis not present

## 2021-03-19 DIAGNOSIS — R0602 Shortness of breath: Secondary | ICD-10-CM | POA: Insufficient documentation

## 2021-03-19 DIAGNOSIS — Z171 Estrogen receptor negative status [ER-]: Secondary | ICD-10-CM

## 2021-03-19 LAB — POCT I-STAT CREATININE: Creatinine, Ser: 0.6 mg/dL (ref 0.44–1.00)

## 2021-03-19 MED ORDER — IOHEXOL 350 MG/ML SOLN
80.0000 mL | Freq: Once | INTRAVENOUS | Status: AC | PRN
  Administered 2021-03-19: 75 mL via INTRAVENOUS

## 2021-03-19 MED ORDER — TRASTUZUMAB-HYALURONIDASE-OYSK 600-10000 MG-UNT/5ML ~~LOC~~ SOLN
600.0000 mg | Freq: Once | SUBCUTANEOUS | Status: AC
  Administered 2021-03-19: 600 mg via SUBCUTANEOUS
  Filled 2021-03-19: qty 5

## 2021-03-19 MED ORDER — ACETAMINOPHEN 325 MG PO TABS
650.0000 mg | ORAL_TABLET | Freq: Once | ORAL | Status: AC
  Administered 2021-03-19: 650 mg via ORAL
  Filled 2021-03-19: qty 2

## 2021-03-19 MED ORDER — DIPHENHYDRAMINE HCL 25 MG PO CAPS
50.0000 mg | ORAL_CAPSULE | Freq: Once | ORAL | Status: AC
  Administered 2021-03-19: 50 mg via ORAL
  Filled 2021-03-19: qty 2

## 2021-03-19 NOTE — Patient Instructions (Signed)
[facility] MEDICAL ONCOLOGY  Discharge Instructions: Thank you for choosing [facility] to provide your oncology and hematology care.   If you have a lab appointment with the Cancer Center, please go directly to the Cancer Center and check in at the registration area.   Wear comfortable clothing and clothing appropriate for easy access to any Portacath or PICC line.   We strive to give you quality time with your provider. You may need to reschedule your appointment if you arrive late (15 or more minutes).  Arriving late affects you and other patients whose appointments are after yours.  Also, if you miss three or more appointments without notifying the office, you may be dismissed from the clinic at the provider's discretion.      For prescription refill requests, have your pharmacy contact our office and allow 72 hours for refills to be completed.    Today you received the following chemotherapy and/or immunotherapy agents herceptin hylecta    To help prevent nausea and vomiting after your treatment, we encourage you to take your nausea medication as directed.  BELOW ARE SYMPTOMS THAT SHOULD BE REPORTED IMMEDIATELY: . *FEVER GREATER THAN 100.4 F (38 C) OR HIGHER . *CHILLS OR SWEATING . *NAUSEA AND VOMITING THAT IS NOT CONTROLLED WITH YOUR NAUSEA MEDICATION . *UNUSUAL SHORTNESS OF BREATH . *UNUSUAL BRUISING OR BLEEDING . *URINARY PROBLEMS (pain or burning when urinating, or frequent urination) . *BOWEL PROBLEMS (unusual diarrhea, constipation, pain near the anus) . TENDERNESS IN MOUTH AND THROAT WITH OR WITHOUT PRESENCE OF ULCERS (sore throat, sores in mouth, or a toothache) . UNUSUAL RASH, SWELLING OR PAIN  . UNUSUAL VAGINAL DISCHARGE OR ITCHING   Items with * indicate a potential emergency and should be followed up as soon as possible or go to the Emergency Department if any problems should occur.  Please show the CHEMOTHERAPY ALERT CARD or IMMUNOTHERAPY  ALERT CARD at check-in to the Emergency Department and triage nurse.  Should you have questions after your visit or need to cancel or reschedule your appointment, please contact [facility] MEDICAL ONCOLOGY  Dept: [phone]  and follow the prompts.  Office hours are 8:00 a.m. to 4:30 p.m. Monday - Friday. Please note that voicemails left after 4:00 p.m. may not be returned until the following business day.  We are closed weekends and major holidays. You have access to a nurse at all times for urgent questions. Please call the main number to the clinic Dept: [phone] and follow the prompts.   For any non-urgent questions, you may also contact your provider using MyChart. We now offer e-Visits for anyone 18 and older to request care online for non-urgent symptoms. For details visit [email or web address].   Also download the MyChart app! Go to the app store, search "MyChart", open the app, select [facility], and log in with your MyChart username and password.  Due to Covid, a mask is required upon entering the hospital/clinic. If you do not have a mask, one will be given to you upon arrival. For doctor visits, patients may have 1 support person aged 18 or older with them. For treatment visits, patients cannot have anyone with them due to current Covid guidelines and our immunocompromised population.   

## 2021-03-22 ENCOUNTER — Other Ambulatory Visit: Payer: Self-pay | Admitting: *Deleted

## 2021-03-22 DIAGNOSIS — R0789 Other chest pain: Secondary | ICD-10-CM

## 2021-03-22 NOTE — Progress Notes (Signed)
Per request of Wilber Bihari, NP RN placed referral to Cardiology.  Inbasket sent to HIM team for stat referral placement and appt request. Pt notified and verbalized understanding.

## 2021-03-23 ENCOUNTER — Encounter: Payer: Self-pay | Admitting: Plastic Surgery

## 2021-03-23 ENCOUNTER — Ambulatory Visit (INDEPENDENT_AMBULATORY_CARE_PROVIDER_SITE_OTHER): Payer: Medicaid Other | Admitting: Plastic Surgery

## 2021-03-23 ENCOUNTER — Encounter: Payer: Self-pay | Admitting: *Deleted

## 2021-03-23 ENCOUNTER — Other Ambulatory Visit: Payer: Self-pay

## 2021-03-23 DIAGNOSIS — Z171 Estrogen receptor negative status [ER-]: Secondary | ICD-10-CM

## 2021-03-23 DIAGNOSIS — C50212 Malignant neoplasm of upper-inner quadrant of left female breast: Secondary | ICD-10-CM

## 2021-03-23 NOTE — Progress Notes (Signed)
   Subjective:    Patient ID: Sharon Fischer, female    DOB: 12-Aug-1965, 55 y.o.   MRN: [id number]  The patient is a 55 year old female here for follow-up on her breast surgery.  In July she underwent bilateral mastectomies with reconstruction.  She had expanders placed.  She did not tolerate the expansion very well and this was holding up her radiation treatment.  After much discussion with her and the oncologist, the decision was made to remove the expanders for right now.  I think that the surgery and any medications she was getting had an impact on the healing.  The expanders were removed in September.  Her incisions are healing nicely.  This is much better than it had been with the expanders in place.  There is no sign of seroma or hematoma.     Review of Systems  Constitutional: Negative.   Eyes: Negative.   Respiratory: Negative.    Cardiovascular: Negative.   Gastrointestinal: Negative.   Endocrine: Negative.   Genitourinary: Negative.   Musculoskeletal: Negative.   Hematological: Negative.   Psychiatric/Behavioral: Negative.        Objective:   Physical Exam Vitals and nursing note reviewed.  Constitutional:      Appearance: Normal appearance.  HENT:     Head: Normocephalic and atraumatic.  Cardiovascular:     Rate and Rhythm: Normal rate.     Pulses: Normal pulses.  Skin:    General: Skin is warm.  Neurological:     Mental Status: She is alert and oriented to person, place, and time.  Psychiatric:        Mood and Affect: Mood normal.        Behavior: Behavior normal.       Assessment & Plan:     ICD-10-CM   1. Malignant neoplasm of upper-inner quadrant of left breast in female, estrogen receptor negative ([place])  C50.212    Z17.1      I understand the urgency of the need to start radiation.  I think it would be safe to go ahead and start.  I like to see the patient back in 1 month.  She knows to call if she has any questions or concerns. Pictures were obtained  of the patient and placed in the chart with the patient's or guardian's permission.

## 2021-04-06 ENCOUNTER — Encounter: Payer: Self-pay | Admitting: Hematology and Oncology

## 2021-04-06 NOTE — Telephone Encounter (Signed)
No entry 

## 2021-04-07 NOTE — Progress Notes (Signed)
Patient Care Team: Muse, Verdell Face., PA-C as PCP - General Therese Sarah, RN as Oncology Nurse Navigator (Oncology) Pershing Proud, RN as Oncology Nurse Navigator Donnelly Angelica, RN as Oncology Nurse Navigator Serena Croissant, MD as Consulting Physician (Hematology and Oncology) Griselda Miner, MD as Consulting Physician (General Surgery)  DIAGNOSIS:    ICD-10-CM   1. Malignant neoplasm of upper-inner quadrant of left breast in female, estrogen receptor negative (HCC)  C50.212    Z17.1       SUMMARY OF ONCOLOGIC HISTORY: Oncology History  Malignant neoplasm of upper-inner quadrant of left breast in female, estrogen receptor negative (HCC)  01/2020 Initial Diagnosis   Palpable right breast mass growing since July 2021.  Mammogram revealed 2.2 cm right breast mass with a 0.8 cm satellite lesion.  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   03/31/2020 Cancer Staging   Staging form: Breast, AJCC 8th Edition - Clinical stage from 03/31/2020: Stage IIIB (cT2, cN1(f), cM0, G3, ER-, PR-, HER2-) - Signed by Serena Croissant, MD on 04/02/2020    04/09/2020 - 08/06/2020 Chemotherapy    Patient is on Treatment Plan: BREAST AC Q21D / CARBOPLATIN D1 + PACLITAXEL D1,8,15 Q21D, Keytruda discontinued for pneumonitis and Taxol discontinued after 4 cycles because of failure to thrive       12/16/2020 Surgery   Bilateral mastectomies   Left mastectomy: Intraductal papilloma, UDH Right mastectomy: No residual carcinoma was 1/3 lymph nodes positive ER negative, PR +20%, HER2 2+ by IHC, positive by FISH ratio 2.03   02/05/2021 -  Chemotherapy   Patient is on Treatment Plan : BREAST Trastuzumab q21d       CHIEF COMPLIANT: Cycle 4 Herceptin  INTERVAL HISTORY: Sharon Fischer is a 55 y.o. with above-mentioned history of  breast cancer who completed neoadjuvant chemotherapy and  bilateral mastectomies. She presents to the clinic today for cycle 4 of Herceptin.  She had shortness of breath which she attributed to gabapentin and she stopped taking gabapentin and her symptoms went away.  CT of the chest was performed to rule out PE and there was no evidence of pulmonary embolism or any other lung problems.  She is going to see the cardiologist next month to evaluate her echocardiogram.  ALLERGIES:  has No Known Allergies.  MEDICATIONS:  Current Outpatient Medications  Medication Sig Dispense Refill   acetaminophen (TYLENOL) 325 MG tablet Take 325 mg by mouth every 6 (six) hours as needed (FOR PAIN).     albuterol (VENTOLIN HFA) 108 (90 Base) MCG/ACT inhaler Inhale 1-2 puffs into the lungs every 4 (four) hours as needed for wheezing or shortness of breath. 8 g 2   carvedilol (COREG) 3.125 MG tablet Take 3.125 mg by mouth 2 (two) times daily.     dexamethasone (DECADRON) 1 MG tablet Take 1 tablet (1 mg total) by mouth daily. (Patient taking differently: Take 1 mg by mouth in the morning.) 30 tablet 2   gabapentin (NEURONTIN) 300 MG capsule Take 1 capsule (300 mg total) by mouth 2 (two) times daily. 60 capsule 1   hydrochlorothiazide (HYDRODIURIL) 12.5 MG tablet Take by mouth.     No current facility-administered medications for this visit.    PHYSICAL EXAMINATION: ECOG PERFORMANCE STATUS: 1 - Symptomatic but completely ambulatory  There were no vitals filed for this visit. There were no vitals filed for this  visit.  LABORATORY DATA:  I have reviewed the data as listed CMP Latest Ref Rng & Units 03/19/2021 02/16/2021 12/11/2020  Glucose 70 - 99 mg/dL - 58(L) 89  BUN 6 - 20 mg/dL - 8 8  Creatinine 0.44 - 1.00 mg/dL 0.60 0.60 0.68  Sodium 135 - 145 mmol/L - 138 141  Potassium 3.5 - 5.1 mmol/L - 4.0 3.3(L)  Chloride 98 - 111 mmol/L - 108 105  CO2 22 - 32 mmol/L - 22 28  Calcium 8.9 - 10.3 mg/dL - 9.4 9.5  Total Protein 6.5 - 8.1 g/dL - - -  Total Bilirubin 0.3 - 1.2  mg/dL - - -  Alkaline Phos 38 - 126 U/L - - -  AST 15 - 41 U/L - - -  ALT 0 - 44 U/L - - -    Lab Results  Component Value Date   WBC 6.3 12/11/2020   HGB 13.0 12/11/2020   HCT 40.7 12/11/2020   MCV 94.4 12/11/2020   PLT 235 12/11/2020   NEUTROABS 2.0 10/23/2020    ASSESSMENT & PLAN:  Malignant neoplasm of upper-inner quadrant of left breast in female, estrogen receptor negative ([place]) 01/2020: Palpable right breast mass growing since July 2021.  Mammogram revealed 2.2 cm right breast mass with a 0.8 cm satellite lesion.  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   Treatment plan  1. Neoadjuvant chemotherapy with Adriamycin and Cytoxan pembrolizumab 4 followed by Taxol weekly 4 with carboplatin pembrolizumab every 3 weeks x3 (chemo discontinued due to toxicities) Keytruda discontinued because of pneumonitis 2. bilateral mastectomies 12/16/2020: Left mastectomy intraductal papilloma, right mastectomy, no residual cancer in the breast 1/3 lymph nodes positive, ER negative, PR positive, HER2 2+ by IHC, FISH positive ratio 2.03, copy #6.2 3. Followed by adjuvant radiation therapy at [facility] 4.  Herceptin every 3 weeks for 1 year started 02/05/2021 -------------------------------------------------------------------------------------------------------------------------------------- Current treatment: Herceptin maintenance to be completed August 2023  Shortness of breath: CT angiogram 03/19/2021: Negative for PE Echocardiogram 01/13/2021: EF 55 to 60% Another echocardiogram will need to be repeated  Currently undergoing radiation at [facility] [place] Return to clinic every 3 weeks for Herceptin every 6 weeks to follow-up with me.    No orders of the defined types were placed in this encounter.  The patient has a good understanding of the overall plan. she agrees with it. she will call with  any problems that may develop before the next visit here.  Total time spent: 20 mins including face to face time and time spent for planning, charting and coordination of care  Rulon Eisenmenger, MD, MPH 04/09/2021  I, Thana Ates, am acting as scribe for Dr. Nicholas Lose.  I have reviewed the above documentation for accuracy and completeness, and I agree with the above.

## 2021-04-09 ENCOUNTER — Inpatient Hospital Stay: Payer: Medicaid Other

## 2021-04-09 ENCOUNTER — Inpatient Hospital Stay: Payer: Medicaid Other | Admitting: Hematology and Oncology

## 2021-04-09 ENCOUNTER — Other Ambulatory Visit: Payer: Self-pay

## 2021-04-09 DIAGNOSIS — C50212 Malignant neoplasm of upper-inner quadrant of left female breast: Secondary | ICD-10-CM | POA: Diagnosis not present

## 2021-04-09 DIAGNOSIS — Z171 Estrogen receptor negative status [ER-]: Secondary | ICD-10-CM

## 2021-04-09 DIAGNOSIS — Z5112 Encounter for antineoplastic immunotherapy: Secondary | ICD-10-CM | POA: Diagnosis not present

## 2021-04-09 MED ORDER — ACETAMINOPHEN 325 MG PO TABS
650.0000 mg | ORAL_TABLET | Freq: Once | ORAL | Status: AC
  Administered 2021-04-09: 650 mg via ORAL
  Filled 2021-04-09: qty 2

## 2021-04-09 MED ORDER — TRASTUZUMAB-HYALURONIDASE-OYSK 600-10000 MG-UNT/5ML ~~LOC~~ SOLN
600.0000 mg | Freq: Once | SUBCUTANEOUS | Status: AC
  Administered 2021-04-09: 600 mg via SUBCUTANEOUS
  Filled 2021-04-09: qty 5

## 2021-04-09 NOTE — Assessment & Plan Note (Signed)
01/2020:Palpable right breast mass growing since July 2021. Mammogram revealed 2.2 cm right breast mass with a 0.8 cm satellite lesion. 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  Treatment plan 1. 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 bilateral mastectomies 12/16/2020: Left mastectomy intraductal papilloma, right mastectomy, no residual cancer in the breast 1/3 lymph nodes positive, ER negative, PR positive, HER2 2+ by IHC, FISH positive ratio 2.03, copy #6.2 3. Followed by adjuvant radiation therapy 4.  Herceptin every 3 weeks for 1 year started 02/05/2021 -------------------------------------------------------------------------------------------------------------------------------------- Current treatment: Herceptin maintenance to be completed August 2023  Shortness of breath: CT angiogram 03/19/2021: Negative for PE Echocardiogram 01/13/2021: EF 55 to 60% Another echocardiogram will need to be repeated  Return to clinic every 3 weeks for Herceptin every 6 weeks to follow-up with me.

## 2021-04-09 NOTE — Patient Instructions (Addendum)
[facility]COL[facility]  Discharge Instructions: Thank you for choosing [facility] to provide your oncology and hematology care.   If you have a lab appointment with the [facility], please go directly to the [facility] and check in at the registration area.   Wear comfortable clothing and clothing appropriate for easy access to any Portacath or PICC line.   We strive to give you quality time with your provider. You may need to reschedule your appointment if you arrive late (15 or more minutes).  Arriving late affects you and other patients whose appointments are after yours.  Also, if you miss three or more appointments without notifying the office, you may be dismissed from the clinic at the provider's discretion.      For prescription refill requests, have your pharmacy contact our office and allow 72 hours for refills to be completed.    Today you received the following chemotherapy and/or immunotherapy agent: Trastuzumab (Herceptin Hylecta)   To help prevent nausea and vomiting after your treatment, we encourage you to take your nausea medication as directed.  BELOW ARE SYMPTOMS THAT SHOULD BE REPORTED IMMEDIATELY: *FEVER GREATER THAN 100.4 F (38 C) OR HIGHER *CHILLS OR SWEATING *NAUSEA AND VOMITING THAT IS NOT CONTROLLED WITH YOUR NAUSEA MEDICATION *UNUSUAL SHORTNESS OF BREATH *UNUSUAL BRUISING OR BLEEDING *URINARY PROBLEMS (pain or burning when urinating, or frequent urination) *BOWEL PROBLEMS (unusual diarrhea, constipation, pain near the anus) TENDERNESS IN MOUTH AND THROAT WITH OR WITHOUT PRESENCE OF ULCERS (sore throat, sores in mouth, or a toothache) UNUSUAL RASH, SWELLING OR PAIN  UNUSUAL VAGINAL DISCHARGE OR ITCHING   Items with * indicate a potential emergency and should be followed up as soon as possible or go to the Emergency Department if any problems should occur.  Please show the CHEMOTHERAPY ALERT CARD or IMMUNOTHERAPY ALERT  CARD at check-in to the Emergency Department and triage nurse.  Should you have questions after your visit or need to cancel or reschedule your appointment, please contact [facility]  Dept: [phone]  and follow the prompts.  Office hours are 8:00 a.m. to 4:30 p.m. Monday - Friday. Please note that voicemails left after 4:00 p.m. may not be returned until the following business day.  We are closed weekends and major holidays. You have access to a nurse at all times for urgent questions. Please call the main number to the clinic Dept: [phone] and follow the prompts.   For any non-urgent questions, you may also contact your provider using MyChart. We now offer e-Visits for anyone 38 and older to request care online for non-urgent symptoms. For details visit [email or web address].   Also download the MyChart app! Go to the app store, search "MyChart", open the app, select [facility], and log in with your MyChart username and password.  Due to Covid, a mask is required upon entering the hospital/clinic. If you do not have a mask, one will be given to you upon arrival. For doctor visits, patients may have 1 support person aged 74 or older with them. For treatment visits, patients cannot have anyone with them due to current Covid guidelines and our immunocompromised population.

## 2021-04-26 ENCOUNTER — Encounter: Payer: Self-pay | Admitting: *Deleted

## 2021-04-27 ENCOUNTER — Other Ambulatory Visit: Payer: Self-pay

## 2021-04-27 ENCOUNTER — Ambulatory Visit: Payer: Medicaid Other | Admitting: Cardiology

## 2021-04-27 ENCOUNTER — Encounter: Payer: Self-pay | Admitting: Cardiology

## 2021-04-27 VITALS — BP 108/66 | HR 90 | Ht 67.0 in | Wt 129.0 lb

## 2021-04-27 DIAGNOSIS — R0609 Other forms of dyspnea: Secondary | ICD-10-CM

## 2021-04-27 DIAGNOSIS — E782 Mixed hyperlipidemia: Secondary | ICD-10-CM | POA: Diagnosis not present

## 2021-04-27 DIAGNOSIS — I10 Essential (primary) hypertension: Secondary | ICD-10-CM

## 2021-04-27 DIAGNOSIS — F17200 Nicotine dependence, unspecified, uncomplicated: Secondary | ICD-10-CM | POA: Diagnosis not present

## 2021-04-27 MED ORDER — IVABRADINE HCL 5 MG PO TABS: 10.0000 mg | ORAL_TABLET | Freq: Once | ORAL | 0 refills | Status: AC

## 2021-04-27 NOTE — Patient Instructions (Addendum)
Medication Instructions:  Your physician recommends that you continue on your current medications as directed. Please refer to the Current Medication list given to you today.  *If you need a refill on your cardiac medications before your next appointment, please call your pharmacy*   Lab Work: Your physician recommends that you return for lab work in:  TODAY: [facility], [place] If you have labs (blood work) drawn today and your tests are completely normal, you will receive your results only by: MyChart Message (if you have [place]) OR A paper copy in the mail If you have any lab test that is abnormal or we need to change your treatment, we will call you to review the results.   Testing/Procedures:   Your cardiac CT will be scheduled at one of the below locations:   [facility] [address] [place], [place] [address] [phone]   If scheduled at [facility], please arrive at the [facility] main entrance (entrance A) of [facility] 30 minutes prior to test start time. You can use the FREE valet parking offered at the main entrance (encouraged to control the heart rate for the test) Proceed to the [facility] Radiology Department (first floor) to check-in and test prep.  Please follow these instructions carefully (unless otherwise directed):   On the Night Before the Test: Be sure to Drink plenty of water. Do not consume any caffeinated/decaffeinated beverages or chocolate 12 hours prior to your test. Do not take any antihistamines 12 hours prior to your test.   On the Day of the Test: Drink plenty of water until 1 hour prior to the test. Do not eat any food 4 hours prior to the test. You may take your regular medications prior to the test.  Take ivabradine (Corlanor) two hours prior to test. HOLD Coreg (carvedilol)/Hydrochlorothiazide morning of the test. FEMALES- please wear underwire-free bra if available, avoid dresses & tight clothing.        After the Test: Drink plenty of water. After receiving IV contrast, you may experience a mild flushed feeling. This is normal. On occasion, you may experience a mild rash up to 24 hours after the test. This is not dangerous. If this occurs, you can take Benadryl 25 mg and increase your fluid intake. If you experience trouble breathing, this can be serious. If it is severe call 911 IMMEDIATELY. If it is mild, please call our office. If you take any of these medications: Glipizide/Metformin, Avandament, Glucavance, please do not take 48 hours after completing test unless otherwise instructed.  Please allow 2-4 weeks for scheduling of routine cardiac CTs. Some insurance companies require a pre-authorization which may delay scheduling of this test.   For non-scheduling related questions, please contact the cardiac imaging nurse navigator should you have any questions/concerns: Marchia Bond, Cardiac Imaging Nurse Navigator Gordy Clement, Cardiac Imaging Nurse Navigator [facility] Direct Office Dial: [phone]   For scheduling needs, including cancellations and rescheduling, please call [place], [phone].    Follow-Up: At [facility], you and your health needs are our priority.  As part of our continuing mission to provide you with exceptional heart care, we have created designated Provider Care Teams.  These Care Teams include your primary Cardiologist (physician) and Advanced Practice Providers (APPs -  Physician Assistants and Nurse Practitioners) who all work together to provide you with the care you need, when you need it.  We recommend signing up for the patient portal called "MyChart".  Sign up information is provided on this After Visit Summary.  MyChart is used to connect with patients for Virtual Visits (Telemedicine).  Patients are able to view lab/test results, encounter notes, upcoming appointments, etc.  Non-urgent messages can be sent to your  provider as well.   To learn more about what you can do with MyChart, go to [email or web address].    Your next appointment:   1 year(s)  The format for your next appointment:   In Person  Provider:   Andrey Cota, DO   Other Instructions

## 2021-04-27 NOTE — Progress Notes (Signed)
Cardiology Office Note:    Date:  04/27/2021   ID:  Sharon Fischer, DOB 1966-03-23, MRN [id number]  PCP:  Raiford Simmonds., PA-C  Cardiologist:  None  Electrophysiologist:  None   Referring MD: Wilber Bihari Cornett*   " I am really short of breath"   History of Present Illness:    Sharon Fischer is a 55 y.o. female with a hx of stroke status post chemotherapy, surgery with bilateral mastectomies and ongoing radiation, hypertension, hyperlipidemia, smoker is here today to be evaluated for dyspnea on exertion as well as significant fatigue.  The patient tells me recently she has had worsening shortness of breath and exertion.  She notes that activities as she started as cooking which used to be fine for now a complete work for her.  She says she gets so tired after she gets short of breath and she is not interested in doing any activities.  She denies any chest pain.   Past Medical History:  Diagnosis Date   Breast cancer ([place]) 03/2020   Family history of throat cancer    Hypertension     Past Surgical History:  Procedure Laterality Date   AXILLARY SENTINEL NODE BIOPSY Right 12/16/2020   Procedure: TARGETED RIGHT SENTINEL LYMPH NODE DISSECTION;  Surgeon: Jovita Kussmaul, MD;  Location: [facility];  Service: General;  Laterality: Right;   BREAST CYST EXCISION Left 12/16/2020   Procedure: CYST EXCISION LEFT AXILLA;  Surgeon: Jovita Kussmaul, MD;  Location: [facility];  Service: General;  Laterality: Left;   BREAST RECONSTRUCTION WITH PLACEMENT OF TISSUE EXPANDER AND FLEX HD (ACELLULAR HYDRATED DERMIS) Bilateral 12/16/2020   Procedure: IMMEDIATE BILATERAL BREAST RECONSTRUCTION WITH PLACEMENT OF TISSUE EXPANDER AND FLEX HD (ACELLULAR HYDRATED DERMIS);  Surgeon: Wallace Going, DO;  Location: [facility];  Service: Plastics;  Laterality: Bilateral;   IR IMAGING GUIDED PORT INSERTION  03/30/2020   PORT-A-CATH REMOVAL Right 12/16/2020   Procedure: REMOVAL PORT-A-CATH;  Surgeon: Jovita Kussmaul, MD;   Location: [facility];  Service: General;  Laterality: Right;   REMOVAL OF BILATERAL TISSUE EXPANDERS WITH PLACEMENT OF BILATERAL BREAST IMPLANTS Bilateral 02/22/2021   Procedure: Removal of bilateral breast expanders without implant placement;  Surgeon: Wallace Going, DO;  Location: [facility];  Service: Plastics;  Laterality: Bilateral;   TOTAL MASTECTOMY Bilateral 12/16/2020   Procedure: TOTAL MASTECTOMY;  Surgeon: [name], MD;  [facility];  Service: General;  Laterality: Bilateral;    Current Medications: Current Meds  Medication Sig   acetaminophen (TYLENOL) 325 MG tablet Take 325 mg by mouth every 6 (six) hours as needed (FOR PAIN).   albuterol (VENTOLIN HFA) 108 (90 Base) MCG/ACT inhaler Inhale 1-2 puffs into the lungs every 4 (four) hours as needed for wheezing or shortness of breath.   atorvastatin (LIPITOR) 20 MG tablet Take 20 mg by mouth at bedtime.   carvedilol (COREG) 3.125 MG tablet Take 3.125 mg by mouth 2 (two) times daily.   hydrochlorothiazide (HYDRODIURIL) 12.5 MG tablet Take by mouth.   ivabradine (CORLANOR) 5 MG TABS tablet Take 2 tablets (10 mg total) by mouth once for 1 dose.   mometasone (ELOCON) 0.1 % ointment SMARTSIG:Topical Every 12 Hours     Allergies:   Patient has no known allergies.   Social History   Socioeconomic History   Marital status: Single    Spouse name: Not on file   Number of children: Not on file   Years of education: Not on file  Highest education level: Not on file  Occupational History   Not on file  Tobacco Use   Smoking status: Every Day    Packs/day: 0.25    Years: 25.00    Pack years: 6.25    Types: Cigarettes   Smokeless tobacco: Never   Tobacco comments:    7-8 cigs /day  Substance and Sexual Activity   Alcohol use: Never   Drug use: Never   Sexual activity: Not on file  Other Topics Concern   Not on file  Social History Narrative   Not on file   Social Determinants of Health   Financial  Resource Strain: Not on file  Food Insecurity: Not on file  Transportation Needs: Not on file  Physical Activity: Not on file  Stress: Not on file  Social Connections: Not on file     Family History: The patient's family history includes Cancer in her cousin and cousin; Throat cancer in her maternal uncle and maternal uncle.  ROS:   Review of Systems  Constitution: Negative for decreased appetite, fever and weight gain.  HENT: Negative for congestion, ear discharge, hoarse voice and sore throat.   Eyes: Negative for discharge, redness, vision loss in right eye and visual halos.  Cardiovascular: Negative for chest pain, dyspnea on exertion, leg swelling, orthopnea and palpitations.  Respiratory: Negative for cough, hemoptysis, shortness of breath and snoring.   Endocrine: Negative for heat intolerance and polyphagia.  Hematologic/Lymphatic: Negative for bleeding problem. Does not bruise/bleed easily.  Skin: Negative for flushing, nail changes, rash and suspicious lesions.  Musculoskeletal: Negative for arthritis, joint pain, muscle cramps, myalgias, neck pain and stiffness.  Gastrointestinal: Negative for abdominal pain, bowel incontinence, diarrhea and excessive appetite.  Genitourinary: Negative for decreased libido, genital sores and incomplete emptying.  Neurological: Negative for brief paralysis, focal weakness, headaches and loss of balance.  Psychiatric/Behavioral: Negative for altered mental status, depression and suicidal ideas.  Allergic/Immunologic: Negative for HIV exposure and persistent infections.    EKGs/Labs/Other Studies Reviewed:    The following studies were reviewed today:   EKG:  The ekg ordered today demonstrates sinus rhythm, heart rate 90 bpm with prolonged QT interval 499 ms  Recent Labs: 10/16/2020: TSH 0.362 10/23/2020: ALT 32 12/11/2020: Hemoglobin 13.0; Platelets 235 02/16/2021: BUN 8; Potassium 4.0; Sodium 138 03/19/2021: Creatinine, Ser 0.60  Recent  Lipid Panel No results found for: CHOL, TRIG, HDL, CHOLHDL, VLDL, LDLCALC, LDLDIRECT  Physical Exam:    VS:  BP 108/66   Pulse 90   Ht 5\' 7"  (1.702 m)   Wt 129 lb (58.5 kg)   SpO2 94%   BMI 20.20 kg/m     Wt Readings from Last 3 Encounters:  04/27/21 129 lb (58.5 kg)  04/09/21 133 lb (60.3 kg)  03/18/21 133 lb 6.4 oz (60.5 kg)     GEN: Well nourished, well developed in no acute distress HEENT: Normal NECK: No JVD; No carotid bruits LYMPHATICS: No lymphadenopathy CARDIAC: S1S2 noted,RRR, no murmurs, rubs, gallops RESPIRATORY:  Clear to auscultation without rales, wheezing or rhonchi  ABDOMEN: Soft, non-tender, non-distended, +bowel sounds, no guarding. EXTREMITIES: No edema, No cyanosis, no clubbing MUSCULOSKELETAL:  No deformity  SKIN: Warm and dry NEUROLOGIC:  Alert and oriented x 3, non-focal PSYCHIATRIC:  Normal affect, good insight  ASSESSMENT:    1. Dyspnea on exertion   2. Hypertension, unspecified type   3. Smoker   4. Mixed hyperlipidemia    PLAN:    The symptoms of her dyspnea on exertion  is concerning, this patient does have intermediate risk for coronary artery disease and at this time I would like to pursue an ischemic evaluation in this patient.  Shared decision a coronary CTA at this time is appropriate.  I have discussed with the patient about the testing.  The patient has no IV contrast allergy and is agreeable to proceed with this test.  She had a recent echocardiogram does not show any good reason for her shortness of breath.  Smoking cessation advised  Blood pressure is acceptable, continue with current antihypertensive regimen.  Hyperlipidemia - continue with current statin medication.  She reports recently having blood work at the [facility] work on her request this office for my review.  The patient is in agreement with the above plan. The patient left the office in stable condition.  The patient will follow up in   Medication  Adjustments/Labs and Tests Ordered: Current medicines are reviewed at length with the patient today.  Concerns regarding medicines are outlined above.  Orders Placed This Encounter  Procedures   CT CORONARY MORPH W/CTA COR W/SCORE W/CA W/CM &/OR WO/CM   Basic Metabolic Panel (BMET)   Magnesium   EKG 12-Lead    Meds ordered this encounter  Medications   ivabradine (CORLANOR) 5 MG TABS tablet    Sig: Take 2 tablets (10 mg total) by mouth once for 1 dose.    Dispense:  2 tablet    Refill:  0     Patient Instructions  Medication Instructions:  Your physician recommends that you continue on your current medications as directed. Please refer to the Current Medication list given to you today.  *If you need a refill on your cardiac medications before your next appointment, please call your pharmacy*   Lab Work: Your physician recommends that you return for lab work in:  TODAY: BMET, [place] If you have labs (blood work) drawn today and your tests are completely normal, you will receive your results only by: MyChart Message (if you have [facility]) OR A paper copy in the mail If you have any lab test that is abnormal or we need to change your treatment, we will call you to review the results.   Testing/Procedures:   Your cardiac CT will be scheduled at one of the below locations:   [facility] [address] [place], [place] [address] [phone]   If scheduled at [facility], please arrive at the [facility] main entrance (entrance A) of [facility] 30 minutes prior to test start time. You can use the FREE valet parking offered at the main entrance (encouraged to control the heart rate for the test) Proceed to the [facility] Radiology Department (first floor) to check-in and test prep.  Please follow these instructions carefully (unless otherwise directed):   On the Night Before the Test: Be sure to Drink plenty of water. Do not consume any  caffeinated/decaffeinated beverages or chocolate 12 hours prior to your test. Do not take any antihistamines 12 hours prior to your test.   On the Day of the Test: Drink plenty of water until 1 hour prior to the test. Do not eat any food 4 hours prior to the test. You may take your regular medications prior to the test.  Take ivabradine (Corlanor) two hours prior to test. HOLD Coreg (carvedilol)/Hydrochlorothiazide morning of the test. FEMALES- please wear underwire-free bra if available, avoid dresses & tight clothing.       After the  Test: Drink plenty of water. After receiving IV contrast, you may experience a mild flushed feeling. This is normal. On occasion, you may experience a mild rash up to 24 hours after the test. This is not dangerous. If this occurs, you can take Benadryl 25 mg and increase your fluid intake. If you experience trouble breathing, this can be serious. If it is severe call 911 IMMEDIATELY. If it is mild, please call our office. If you take any of these medications: Glipizide/Metformin, Avandament, Glucavance, please do not take 48 hours after completing test unless otherwise instructed.  Please allow 2-4 weeks for scheduling of routine cardiac CTs. Some insurance companies require a pre-authorization which may delay scheduling of this test.   For non-scheduling related questions, please contact the cardiac imaging nurse navigator should you have any questions/concerns: Marchia Bond, Cardiac Imaging Nurse Navigator Gordy Clement, Cardiac Imaging Nurse Navigator [facility] Direct Office Dial: [phone]   For scheduling needs, including cancellations and rescheduling, please call [place], [phone].    Follow-Up: At [facility], you and your health needs are our priority.  As part of our continuing mission to provide you with exceptional heart care, we have created designated Provider Care Teams.  These Care Teams include  your primary Cardiologist (physician) and Advanced Practice Providers (APPs -  Physician Assistants and Nurse Practitioners) who all work together to provide you with the care you need, when you need it.  We recommend signing up for the patient portal called "MyChart".  Sign up information is provided on this After Visit Summary.  MyChart is used to connect with patients for Virtual Visits (Telemedicine).  Patients are able to view lab/test results, encounter notes, upcoming appointments, etc.  Non-urgent messages can be sent to your provider as well.   To learn more about what you can do with MyChart, go to [email or web address].    Your next appointment:   1 year(s)  The format for your next appointment:   In Person  Provider:   Andrey Cota, DO   Other Instructions     Adopting a Healthy Lifestyle.  Know what a healthy weight is for you (roughly BMI <25) and aim to maintain this   Aim for 7+ servings of fruits and vegetables daily   65-80+ fluid ounces of water or unsweet tea for healthy kidneys   Limit to max 1 drink of alcohol per day; avoid smoking/tobacco   Limit animal fats in diet for cholesterol and heart health - choose grass fed whenever available   Avoid highly processed foods, and foods high in saturated/trans fats   Aim for low stress - take time to unwind and care for your mental health   Aim for 150 min of moderate intensity exercise weekly for heart health, and weights twice weekly for bone health   Aim for 7-9 hours of sleep daily   When it comes to diets, agreement about the perfect plan isnt easy to find, even among the experts. Experts at the [place] developed an idea known as the Healthy Eating Plate. Just imagine a plate divided into logical, healthy portions.   The emphasis is on diet quality:   Load up on vegetables and fruits - one-half of your plate: Aim for color and variety, and remember that potatoes dont count.    Go for whole grains - one-quarter of your plate: Whole wheat, barley, wheat berries, quinoa, oats, brown rice, and foods made with them. If you want  pasta, go with whole wheat pasta.   Protein power - one-quarter of your plate: Fish, chicken, beans, and nuts are all healthy, versatile protein sources. Limit red meat.   The diet, however, does go beyond the plate, offering a few other suggestions.   Use healthy plant oils, such as olive, canola, soy, corn, sunflower and peanut. Check the labels, and avoid partially hydrogenated oil, which have unhealthy trans fats.   If youre thirsty, drink water. Coffee and tea are good in moderation, but skip sugary drinks and limit milk and dairy products to one or two daily servings.   The type of carbohydrate in the diet is more important than the amount. Some sources of carbohydrates, such as vegetables, fruits, whole grains, and beans-are healthier than others.   Finally, stay active  Signed, Berniece Salines, DO  04/27/2021 10:36 PM    [facility]

## 2021-04-28 ENCOUNTER — Ambulatory Visit: Payer: Medicaid Other | Admitting: Cardiology

## 2021-04-30 ENCOUNTER — Telehealth: Payer: Self-pay | Admitting: *Deleted

## 2021-04-30 ENCOUNTER — Inpatient Hospital Stay: Payer: Medicaid Other | Attending: Hematology and Oncology

## 2021-04-30 ENCOUNTER — Other Ambulatory Visit: Payer: Self-pay

## 2021-04-30 VITALS — BP 117/64 | HR 95 | Temp 98.5°F | Resp 16

## 2021-04-30 DIAGNOSIS — R0789 Other chest pain: Secondary | ICD-10-CM

## 2021-04-30 DIAGNOSIS — Z9013 Acquired absence of bilateral breasts and nipples: Secondary | ICD-10-CM | POA: Diagnosis not present

## 2021-04-30 DIAGNOSIS — C50212 Malignant neoplasm of upper-inner quadrant of left female breast: Secondary | ICD-10-CM | POA: Insufficient documentation

## 2021-04-30 DIAGNOSIS — Z5112 Encounter for antineoplastic immunotherapy: Secondary | ICD-10-CM | POA: Diagnosis present

## 2021-04-30 DIAGNOSIS — Z171 Estrogen receptor negative status [ER-]: Secondary | ICD-10-CM

## 2021-04-30 DIAGNOSIS — I427 Cardiomyopathy due to drug and external agent: Secondary | ICD-10-CM

## 2021-04-30 DIAGNOSIS — Z800 Family history of malignant neoplasm of digestive organs: Secondary | ICD-10-CM

## 2021-04-30 MED ORDER — TRASTUZUMAB-HYALURONIDASE-OYSK 600-10000 MG-UNT/5ML ~~LOC~~ SOLN
600.0000 mg | Freq: Once | SUBCUTANEOUS | Status: AC
  Administered 2021-04-30: 600 mg via SUBCUTANEOUS
  Filled 2021-04-30: qty 5

## 2021-04-30 MED ORDER — ACETAMINOPHEN 325 MG PO TABS
650.0000 mg | ORAL_TABLET | Freq: Once | ORAL | Status: AC
  Administered 2021-04-30: 650 mg via ORAL
  Filled 2021-04-30: qty 2

## 2021-04-30 NOTE — Telephone Encounter (Signed)
Per Dr.Gudena. OK to treat with 8/8 ECHO. New ECHO order has been placed

## 2021-04-30 NOTE — Patient Instructions (Signed)
[facility] ONCOLOGY  Discharge Instructions: Thank you for choosing [facility] to provide your oncology and hematology care.   If you have a lab appointment with the [facility], please go directly to the [facility] and check in at the registration area.   Wear comfortable clothing and clothing appropriate for easy access to any Portacath or PICC line.   We strive to give you quality time with your provider. You may need to reschedule your appointment if you arrive late (15 or more minutes).  Arriving late affects you and other patients whose appointments are after yours.  Also, if you miss three or more appointments without notifying the office, you may be dismissed from the clinic at the provider's discretion.      For prescription refill requests, have your pharmacy contact our office and allow 72 hours for refills to be completed.    Today you received the following chemotherapy and/or immunotherapy agent: Trastuzumab (Herceptin Hylecta)   To help prevent nausea and vomiting after your treatment, we encourage you to take your nausea medication as directed.  BELOW ARE SYMPTOMS THAT SHOULD BE REPORTED IMMEDIATELY: *FEVER GREATER THAN 100.4 F (38 C) OR HIGHER *CHILLS OR SWEATING *NAUSEA AND VOMITING THAT IS NOT CONTROLLED WITH YOUR NAUSEA MEDICATION *UNUSUAL SHORTNESS OF BREATH *UNUSUAL BRUISING OR BLEEDING *URINARY PROBLEMS (pain or burning when urinating, or frequent urination) *BOWEL PROBLEMS (unusual diarrhea, constipation, pain near the anus) TENDERNESS IN MOUTH AND THROAT WITH OR WITHOUT PRESENCE OF ULCERS (sore throat, sores in mouth, or a toothache) UNUSUAL RASH, SWELLING OR PAIN  UNUSUAL VAGINAL DISCHARGE OR ITCHING   Items with * indicate a potential emergency and should be followed up as soon as possible or go to the Emergency Department if any problems should occur.  Please show the CHEMOTHERAPY ALERT CARD or IMMUNOTHERAPY ALERT  CARD at check-in to the Emergency Department and triage nurse.  Should you have questions after your visit or need to cancel or reschedule your appointment, please contact [facility]  Dept: [phone]  and follow the prompts.  Office hours are 8:00 a.m. to 4:30 p.m. Monday - Friday. Please note that voicemails left after 4:00 p.m. may not be returned until the following business day.  We are closed weekends and major holidays. You have access to a nurse at all times for urgent questions. Please call the main number to the clinic Dept: [phone] and follow the prompts.   For any non-urgent questions, you may also contact your provider using MyChart. We now offer e-Visits for anyone 43 and older to request care online for non-urgent symptoms. For details visit [email or web address].   Also download the MyChart app! Go to the app store, search "MyChart", open the app, select [facility], and log in with your MyChart username and password.  Due to Covid, a mask is required upon entering the hospital/clinic. If you do not have a mask, one will be given to you upon arrival. For doctor visits, patients may have 1 support person aged 38 or older with them. For treatment visits, patients cannot have anyone with them due to current Covid guidelines and our immunocompromised population.

## 2021-05-11 ENCOUNTER — Telehealth (HOSPITAL_COMMUNITY): Payer: Self-pay | Admitting: *Deleted

## 2021-05-11 NOTE — Telephone Encounter (Signed)
Reaching out to patient to offer assistance regarding upcoming cardiac imaging study; pt verbalizes understanding of appt date/time, parking situation and where to check in, pre-test NPO status and medications ordered, and verified current allergies; name and call back number provided for further questions should they arise  Gordy Clement RN Navigator Cardiac Imaging [facility] [phone] office [phone] cell  Patient to take her AM dose of carvedilol and 10mg  ivabradine two hours prior to cardiac CT. She is aware to obtain blood work prior to appointment and is aware to arrive at 12:30pm for her 1pm scan.

## 2021-05-12 ENCOUNTER — Encounter: Payer: Self-pay | Admitting: Hematology and Oncology

## 2021-05-12 LAB — BASIC METABOLIC PANEL
BUN/Creatinine Ratio: 10 (ref 9–23)
BUN: 7 mg/dL (ref 6–24)
CO2: 21 mmol/L (ref 20–29)
Calcium: 9.9 mg/dL (ref 8.7–10.2)
Chloride: 105 mmol/L (ref 96–106)
Creatinine, Ser: 0.69 mg/dL (ref 0.57–1.00)
Glucose: 91 mg/dL (ref 70–99)
Potassium: 4.4 mmol/L (ref 3.5–5.2)
Sodium: 139 mmol/L (ref 134–144)
eGFR: 101 mL/min/{1.73_m2} (ref >59)

## 2021-05-12 LAB — MAGNESIUM: Magnesium: 1.5 mg/dL — ABNORMAL LOW (ref 1.6–2.3)

## 2021-05-13 ENCOUNTER — Other Ambulatory Visit: Payer: Self-pay

## 2021-05-13 ENCOUNTER — Ambulatory Visit (HOSPITAL_COMMUNITY)
Admission: RE | Admit: 2021-05-13 | Discharge: 2021-05-13 | Disposition: A | Discharge status: Home / Self Care | Payer: Medicaid Other | Source: Ambulatory Visit | Attending: Cardiology | Admitting: Cardiology

## 2021-05-13 ENCOUNTER — Encounter (HOSPITAL_COMMUNITY): Payer: Self-pay

## 2021-05-13 DIAGNOSIS — R790 Abnormal level of blood mineral: Secondary | ICD-10-CM

## 2021-05-13 DIAGNOSIS — R0609 Other forms of dyspnea: Secondary | ICD-10-CM | POA: Diagnosis present

## 2021-05-13 MED ORDER — NITROGLYCERIN 0.4 MG SL SUBL: 0.4000 mg | SUBLINGUAL_TABLET | Freq: Once | SUBLINGUAL | Status: DC

## 2021-05-13 MED ORDER — MAGNESIUM 400 MG PO CAPS: 400.0000 mg | ORAL_CAPSULE | Freq: Two times a day (BID) | ORAL | 0 refills | Status: DC

## 2021-05-13 MED ORDER — SODIUM CHLORIDE 0.9 % IV SOLN
Freq: Once | INTRAVENOUS | Status: AC
  Administered 2021-05-13: 500 mL via INTRAVENOUS

## 2021-05-13 NOTE — Progress Notes (Signed)
Patient's blood pressure remains low. Gave normal saline bolus. Per Dr. Marisue Ivan we should abort and he will let the ordering provider know. CT heart navigator aware. Will send patient home. IV removed. Nothing further needed at this time.

## 2021-05-13 NOTE — Progress Notes (Signed)
Prescription sent to pharmacy. Lab orders placed.

## 2021-05-21 ENCOUNTER — Inpatient Hospital Stay: Payer: Medicaid Other | Attending: Hematology and Oncology

## 2021-05-21 ENCOUNTER — Inpatient Hospital Stay: Payer: Medicaid Other | Admitting: Adult Health

## 2021-05-21 ENCOUNTER — Inpatient Hospital Stay: Payer: Medicaid Other

## 2021-05-21 ENCOUNTER — Other Ambulatory Visit: Payer: Self-pay

## 2021-05-21 ENCOUNTER — Encounter: Payer: Self-pay | Admitting: Adult Health

## 2021-05-21 DIAGNOSIS — Z171 Estrogen receptor negative status [ER-]: Secondary | ICD-10-CM

## 2021-05-21 DIAGNOSIS — Z9013 Acquired absence of bilateral breasts and nipples: Secondary | ICD-10-CM | POA: Insufficient documentation

## 2021-05-21 DIAGNOSIS — Z923 Personal history of irradiation: Secondary | ICD-10-CM | POA: Insufficient documentation

## 2021-05-21 DIAGNOSIS — Z9221 Personal history of antineoplastic chemotherapy: Secondary | ICD-10-CM | POA: Diagnosis not present

## 2021-05-21 DIAGNOSIS — C50212 Malignant neoplasm of upper-inner quadrant of left female breast: Secondary | ICD-10-CM

## 2021-05-21 DIAGNOSIS — Z5112 Encounter for antineoplastic immunotherapy: Secondary | ICD-10-CM | POA: Diagnosis not present

## 2021-05-21 DIAGNOSIS — C50211 Malignant neoplasm of upper-inner quadrant of right female breast: Secondary | ICD-10-CM

## 2021-05-21 DIAGNOSIS — F17210 Nicotine dependence, cigarettes, uncomplicated: Secondary | ICD-10-CM | POA: Diagnosis not present

## 2021-05-21 LAB — CMP (CANCER CENTER ONLY)
ALT: 15 U/L (ref 0–44)
AST: 27 U/L (ref 15–41)
Albumin: 3.3 g/dL — ABNORMAL LOW (ref 3.5–5.0)
Alkaline Phosphatase: 115 U/L (ref 38–126)
Anion gap: 6 (ref 5–15)
BUN: 6 mg/dL (ref 6–20)
CO2: 24 mmol/L (ref 22–32)
Calcium: 8.9 mg/dL (ref 8.9–10.3)
Chloride: 108 mmol/L (ref 98–111)
Creatinine: 0.71 mg/dL (ref 0.44–1.00)
GFR, Estimated: >60 mL/min (ref >60)
Glucose, Bld: 71 mg/dL (ref 70–99)
Potassium: 3.9 mmol/L (ref 3.5–5.1)
Sodium: 138 mmol/L (ref 135–145)
Total Bilirubin: 0.6 mg/dL (ref 0.3–1.2)
Total Protein: 5.9 g/dL — ABNORMAL LOW (ref 6.5–8.1)

## 2021-05-21 LAB — CBC WITH DIFFERENTIAL (CANCER CENTER ONLY)
Abs Immature Granulocytes: 0 10*3/uL (ref 0.00–0.07)
Basophils Absolute: 0 10*3/uL (ref 0.0–0.1)
Basophils Relative: 1 %
Eosinophils Absolute: 0.2 10*3/uL (ref 0.0–0.5)
Eosinophils Relative: 10 %
HCT: 30.9 % — ABNORMAL LOW (ref 36.0–46.0)
Hemoglobin: 10.5 g/dL — ABNORMAL LOW (ref 12.0–15.0)
Immature Granulocytes: 0 %
Lymphocytes Relative: 36 %
Lymphs Abs: 0.8 10*3/uL (ref 0.7–4.0)
MCH: 28.3 pg (ref 26.0–34.0)
MCHC: 34 g/dL (ref 30.0–36.0)
MCV: 83.3 fL (ref 80.0–100.0)
Monocytes Absolute: 0.4 10*3/uL (ref 0.1–1.0)
Monocytes Relative: 16 %
Neutro Abs: 0.8 10*3/uL — ABNORMAL LOW (ref 1.7–7.7)
Neutrophils Relative %: 37 %
Platelet Count: 162 10*3/uL (ref 150–400)
RBC: 3.71 MIL/uL — ABNORMAL LOW (ref 3.87–5.11)
RDW: 13.9 % (ref 11.5–15.5)
WBC Count: 2.3 10*3/uL — ABNORMAL LOW (ref 4.0–10.5)
nRBC: 0 % (ref 0.0–0.2)

## 2021-05-21 LAB — TSH: TSH: 1.518 u[IU]/mL (ref 0.308–3.960)

## 2021-05-21 MED ORDER — TRASTUZUMAB-HYALURONIDASE-OYSK 600-10000 MG-UNT/5ML ~~LOC~~ SOLN
600.0000 mg | Freq: Once | SUBCUTANEOUS | Status: AC
  Administered 2021-05-21: 600 mg via SUBCUTANEOUS
  Filled 2021-05-21: qty 5

## 2021-05-21 MED ORDER — ACETAMINOPHEN 325 MG PO TABS
650.0000 mg | ORAL_TABLET | Freq: Once | ORAL | Status: AC
  Administered 2021-05-21: 650 mg via ORAL

## 2021-05-21 NOTE — Patient Instructions (Signed)
[facility] ONCOLOGY  Discharge Instructions: Thank you for choosing [facility] to provide your oncology and hematology care.   If you have a lab appointment with the [facility], please go directly to the [facility] and check in at the registration area.   Wear comfortable clothing and clothing appropriate for easy access to any Portacath or PICC line.   We strive to give you quality time with your provider. You may need to reschedule your appointment if you arrive late (15 or more minutes).  Arriving late affects you and other patients whose appointments are after yours.  Also, if you miss three or more appointments without notifying the office, you may be dismissed from the clinic at the provider's discretion.      For prescription refill requests, have your pharmacy contact our office and allow 72 hours for refills to be completed.    Today you received the following chemotherapy and/or immunotherapy agent: Trastuzumab (Herceptin Hylecta)   To help prevent nausea and vomiting after your treatment, we encourage you to take your nausea medication as directed.  BELOW ARE SYMPTOMS THAT SHOULD BE REPORTED IMMEDIATELY: *FEVER GREATER THAN 100.4 F (38 C) OR HIGHER *CHILLS OR SWEATING *NAUSEA AND VOMITING THAT IS NOT CONTROLLED WITH YOUR NAUSEA MEDICATION *UNUSUAL SHORTNESS OF BREATH *UNUSUAL BRUISING OR BLEEDING *URINARY PROBLEMS (pain or burning when urinating, or frequent urination) *BOWEL PROBLEMS (unusual diarrhea, constipation, pain near the anus) TENDERNESS IN MOUTH AND THROAT WITH OR WITHOUT PRESENCE OF ULCERS (sore throat, sores in mouth, or a toothache) UNUSUAL RASH, SWELLING OR PAIN  UNUSUAL VAGINAL DISCHARGE OR ITCHING   Items with * indicate a potential emergency and should be followed up as soon as possible or go to the Emergency Department if any problems should occur.  Please show the CHEMOTHERAPY ALERT CARD or IMMUNOTHERAPY ALERT  CARD at check-in to the Emergency Department and triage nurse.  Should you have questions after your visit or need to cancel or reschedule your appointment, please contact [facility]  Dept: [phone]  and follow the prompts.  Office hours are 8:00 a.m. to 4:30 p.m. Monday - Friday. Please note that voicemails left after 4:00 p.m. may not be returned until the following business day.  We are closed weekends and major holidays. You have access to a nurse at all times for urgent questions. Please call the main number to the clinic Dept: [phone] and follow the prompts.   For any non-urgent questions, you may also contact your provider using MyChart. We now offer e-Visits for anyone 87 and older to request care online for non-urgent symptoms. For details visit [email or web address].   Also download the MyChart app! Go to the app store, search "MyChart", open the app, select [place] [facility], and log in with your MyChart username and password.  Due to Covid, a mask is required upon entering the hospital/clinic. If you do not have a mask, one will be given to you upon arrival. For doctor visits, patients may have 1 support person aged 25 or older with them. For treatment visits, patients cannot have anyone with them due to current Covid guidelines and our immunocompromised population.

## 2021-05-21 NOTE — Assessment & Plan Note (Addendum)
01/2020:Palpable right breast mass growing since July 2021. Mammogram revealed 2.2 cm right breast mass with a 0.8 cm satellite lesion. 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  Treatment plan 1. 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 bilateral mastectomies 12/16/2020: Left mastectomy intraductal papilloma, right mastectomy, no residual cancer in the breast 1/3 lymph nodes positive, ER negative, PR positive, HER2 2+ by IHC, FISH positive ratio 2.03, copy #6.2 3. Followed by adjuvant radiation therapy 4.  Herceptin every 3 weeks for 1 year started 02/05/2021 -------------------------------------------------------------------------------------------------------------------------------------- Current treatment: Herceptin maintenance to be completed August 2023  Shortness of breath: CT angiogram 03/19/2021: Negative for PE Echocardiogram 01/13/2021: EF 55 to 60% Another echocardiogram is scheduled next week.    Return to clinic every 3 weeks for Herceptin every 6 weeks to follow-up with myself or Dr. Gudena. 

## 2021-05-21 NOTE — Progress Notes (Signed)
[facility] Follow up:    Raiford Simmonds., PA-C [facility] [address] [place] [address]   DIAGNOSIS:  Cancer Staging  Malignant neoplasm of upper-inner quadrant of left breast in female, estrogen receptor negative ([place]) Staging form: Breast, AJCC 8th Edition - Clinical stage from 03/31/2020: Stage IIIB (cT2, cN1(f), cM0, G3, ER-, PR-, HER2-) - Signed by Nicholas Lose, MD on 04/02/2020 Stage prefix: Initial diagnosis Method of lymph node assessment: Core biopsy Histologic grading system: 3 grade system   SUMMARY OF ONCOLOGIC HISTORY: Oncology History  Malignant neoplasm of upper-inner quadrant of left breast in female, estrogen receptor negative ([place])  01/2020 Initial Diagnosis   Palpable right breast mass growing since July 2021.  Mammogram revealed 2.2 cm right breast mass with a 0.8 cm satellite lesion.  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   03/31/2020 Cancer Staging   Staging form: Breast, AJCC 8th Edition - Clinical stage from 03/31/2020: Stage IIIB (cT2, cN1(f), cM0, G3, ER-, PR-, HER2-) - Signed by Nicholas Lose, MD on 04/02/2020    04/09/2020 - 08/06/2020 Chemotherapy    Patient is on Treatment Plan: BREAST AC Q21D / CARBOPLATIN D1 + PACLITAXEL D1,8,15 Q21D, Keytruda discontinued for pneumonitis and Taxol discontinued after 4 cycles because of failure to thrive       12/16/2020 Surgery   Bilateral mastectomies   Left mastectomy: Intraductal papilloma, UDH Right mastectomy: No residual carcinoma was 1/3 lymph nodes positive ER negative, PR +20%, HER2 2+ by IHC, positive by FISH ratio 2.03   02/05/2021 -  Chemotherapy   Patient is on Treatment Plan : BREAST Trastuzumab q21d       CURRENT THERAPY: trastuzumab  INTERVAL HISTORY: Sharon Fischer 55 y.o. female returns for evaluation prior to receiving trastuzumab.  She is  tolerating this moderately well.  Her most recent echo was August 8 and was normal.  Her next echocardiogram is scheduled for later this week.  Otherwise she was doing moderately well.     Patient Active Problem List   Diagnosis Date Noted   Dyspnea on exertion 04/27/2021   Hypertension 04/27/2021   Smoker 04/27/2021   Mixed hyperlipidemia 04/27/2021   Breast cancer ([place]) 12/16/2020   Port-A-Cath in place 05/20/2020   Family history of throat cancer    Malignant neoplasm of upper-inner quadrant of left breast in female, estrogen receptor negative ([place]) 03/31/2020    has No Known Allergies.  MEDICAL HISTORY: Past Medical History:  Diagnosis Date   Breast cancer ([place]) 03/2020   Family history of throat cancer    Hypertension     SURGICAL HISTORY: Past Surgical History:  Procedure Laterality Date   AXILLARY SENTINEL NODE BIOPSY Right 12/16/2020   Procedure: TARGETED RIGHT SENTINEL LYMPH NODE DISSECTION;  Surgeon: Jovita Kussmaul, MD;  Location: [facility];  Service: General;  Laterality: Right;   BREAST CYST EXCISION Left 12/16/2020   Procedure: CYST EXCISION LEFT AXILLA;  Surgeon: Jovita Kussmaul, MD;  Location: [facility];  Service: General;  Laterality: Left;   BREAST RECONSTRUCTION WITH PLACEMENT OF TISSUE EXPANDER AND FLEX HD (ACELLULAR HYDRATED DERMIS) Bilateral 12/16/2020   Procedure: IMMEDIATE BILATERAL BREAST RECONSTRUCTION WITH PLACEMENT OF TISSUE EXPANDER AND FLEX HD (ACELLULAR HYDRATED DERMIS);  Surgeon: Wallace Going, DO;  Location: [facility];  Service: Plastics;  Laterality: Bilateral;   IR IMAGING GUIDED PORT INSERTION  03/30/2020   PORT-A-CATH REMOVAL Right 12/16/2020   Procedure: REMOVAL PORT-A-CATH;  Surgeon: Jovita Kussmaul, MD;  Location: [facility];  Service: General;  Laterality: Right;   REMOVAL OF BILATERAL TISSUE EXPANDERS WITH PLACEMENT OF BILATERAL BREAST IMPLANTS Bilateral 02/22/2021   Procedure: Removal of bilateral breast expanders without implant placement;  Surgeon:  Wallace Going, DO;  Location: [facility];  Service: Plastics;  Laterality: Bilateral;   TOTAL MASTECTOMY Bilateral 12/16/2020   Procedure: TOTAL MASTECTOMY;  Surgeon: Jovita Kussmaul, MD;  Location: [facility];  Service: General;  Laterality: Bilateral;    SOCIAL HISTORY: Social History   Socioeconomic History   Marital status: Single    Spouse name: Not on file   Number of children: Not on file   Years of education: Not on file   Highest education level: Not on file  Occupational History   Not on file  Tobacco Use   Smoking status: Every Day    Packs/day: 0.25    Years: 25.00    Pack years: 6.25    Types: Cigarettes   Smokeless tobacco: Never   Tobacco comments:    7-8 cigs /day  Substance and Sexual Activity   Alcohol use: Never   Drug use: Never   Sexual activity: Not on file  Other Topics Concern   Not on file  Social History Narrative   Not on file   Social Determinants of Health   Financial Resource Strain: Not on file  Food Insecurity: Not on file  Transportation Needs: Not on file  Physical Activity: Not on file  Stress: Not on file  Social Connections: Not on file  Intimate Partner Violence: Not on file    FAMILY HISTORY: Family History  Problem Relation Age of Onset   Throat cancer Maternal Uncle        dx >50, smoker   Throat cancer Maternal Uncle        dx >50, smoker   Cancer Cousin        unknown type, dx >50 (paternal first cousin)   Cancer Cousin        unknown type (paternal first cousin)    Review of Systems  Constitutional:  Negative for appetite change, chills, fatigue, fever and unexpected weight change.  HENT:   Negative for hearing loss, lump/mass and trouble swallowing.   Eyes:  Negative for eye problems and icterus.  Respiratory:  Negative for chest tightness, cough and shortness of breath.   Cardiovascular:  Negative for chest pain, leg swelling and palpitations.  Gastrointestinal:  Negative for abdominal  distention, abdominal pain, constipation, diarrhea, nausea and vomiting.  Endocrine: Negative for hot flashes.  Genitourinary:  Negative for difficulty urinating.   Musculoskeletal:  Negative for arthralgias.  Skin:  Negative for itching and rash.  Neurological:  Negative for dizziness, extremity weakness, headaches and numbness.  Hematological:  Negative for adenopathy. Does not bruise/bleed easily.  Psychiatric/Behavioral:  Negative for depression. The patient is not nervous/anxious.      PHYSICAL EXAMINATION  ECOG PERFORMANCE STATUS: 1 - Symptomatic but completely ambulatory  Vitals:   05/21/21 1300  BP: 125/67  Pulse: 93  Resp: 17  Temp: 97.6 F (36.4 C)  SpO2: 100%    Physical Exam Constitutional:      General: She is not in acute distress.    Appearance: Normal appearance. She is not toxic-appearing.  HENT:     Head: Normocephalic and atraumatic.  Eyes:     General: No scleral icterus.  Cardiovascular:     Rate and Rhythm: Normal rate and regular rhythm.     Pulses: Normal pulses.     Heart sounds: Normal heart sounds.  Pulmonary:     Effort: Pulmonary effort is normal.     Breath sounds: Normal breath sounds.  Abdominal:     General: Abdomen is flat. Bowel sounds are normal. There is no distension.     Palpations: Abdomen is soft.     Tenderness: There is no abdominal tenderness.  Musculoskeletal:        General: No swelling.     Cervical back: Neck supple.  Lymphadenopathy:     Cervical: No cervical adenopathy.  Skin:    General: Skin is warm and dry.     Findings: No rash.  Neurological:     General: No focal deficit present.     Mental Status: She is alert.  Psychiatric:        Mood and Affect: Mood normal.        Behavior: Behavior normal.    LABORATORY DATA:  CBC    Component Value Date/Time   WBC 2.3 (L) 05/21/2021 1246   WBC 6.3 12/11/2020 1200   RBC 3.71 (L) 05/21/2021 1246   HGB 10.5 (L) 05/21/2021 1246   HCT 30.9 (L) 05/21/2021 1246    PLT 162 05/21/2021 1246   MCV 83.3 05/21/2021 1246   MCH 28.3 05/21/2021 1246   MCHC 34.0 05/21/2021 1246   RDW 13.9 05/21/2021 1246   LYMPHSABS 0.8 05/21/2021 1246   MONOABS 0.4 05/21/2021 1246   EOSABS 0.2 05/21/2021 1246   BASOSABS 0.0 05/21/2021 1246    CMP     Component Value Date/Time   NA 138 05/21/2021 1246   NA 139 05/11/2021 1055   K 3.9 05/21/2021 1246   CL 108 05/21/2021 1246   CO2 24 05/21/2021 1246   GLUCOSE 71 05/21/2021 1246   BUN 6 05/21/2021 1246   BUN 7 05/11/2021 1055   CREATININE 0.71 05/21/2021 1246   CALCIUM 8.9 05/21/2021 1246   PROT 5.9 (L) 05/21/2021 1246   ALBUMIN 3.3 (L) 05/21/2021 1246   AST 27 05/21/2021 1246   ALT 15 05/21/2021 1246   ALKPHOS 115 05/21/2021 1246   BILITOT 0.6 05/21/2021 1246   GFRNONAA >60 05/21/2021 1246   GFRAA  02/05/2007 1044    >60        The eGFR has been calculated using the MDRD equation. This calculation has not been validated in all clinical    ASSESSMENT and THERAPY PLAN:   Malignant neoplasm of upper-inner quadrant of left breast in female, estrogen receptor negative ([place]) 01/2020: Palpable right breast mass growing since July 2021.  Mammogram revealed 2.2 cm right breast mass with a 0.8 cm satellite lesion.  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   Treatment plan  1. Neoadjuvant chemotherapy with Adriamycin and Cytoxan pembrolizumab 4 followed by Taxol weekly 4 with carboplatin pembrolizumab every 3 weeks x3 (chemo discontinued due to toxicities) Keytruda discontinued because of pneumonitis 2. bilateral mastectomies 12/16/2020: Left mastectomy intraductal papilloma, right mastectomy, no residual cancer in the breast 1/3 lymph nodes positive, ER negative, PR positive, HER2 2+ by IHC, FISH positive ratio 2.03, copy #6.2 3. Followed by adjuvant radiation therapy 4.  Herceptin every 3 weeks  for 1 year started 02/05/2021 -------------------------------------------------------------------------------------------------------------------------------------- Current treatment: Herceptin maintenance to be  completed August 2023  Shortness of breath: CT angiogram 03/19/2021: Negative for PE Echocardiogram 01/13/2021: EF 55 to 60% Another echocardiogram is scheduled next week.    Return to clinic every 3 weeks for Herceptin every 6 weeks to follow-up with myself or Dr. Lindi Adie.    All questions were answered. The patient knows to call the clinic with any problems, questions or concerns. We can certainly see the patient much sooner if necessary.  Total encounter time: 20 minutes in face to face visit time, chart review, lab review, order entry, care coordination, and documentation of the encounter.    Wilber Bihari, NP 05/24/21 11:30 PM Medical Oncology and Hematology [facility] [place], [place] [address] Tel. [phone]    Fax. [phone]  *Total Encounter Time as defined by the Centers for Medicare and Medicaid Services includes, in addition to the face-to-face time of a patient visit (documented in the note above) non-face-to-face time: obtaining and reviewing outside history, ordering and reviewing medications, tests or procedures, care coordination (communications with other health care professionals or caregivers) and documentation in the medical record.

## 2021-05-24 ENCOUNTER — Encounter: Payer: Self-pay | Admitting: Hematology and Oncology

## 2021-05-24 ENCOUNTER — Telehealth: Payer: Self-pay | Admitting: Adult Health

## 2021-05-24 NOTE — Telephone Encounter (Signed)
Scheduled appointment per 1/29 los. Patient is aware. 

## 2021-05-25 ENCOUNTER — Other Ambulatory Visit: Payer: Self-pay

## 2021-05-25 ENCOUNTER — Ambulatory Visit (HOSPITAL_COMMUNITY)
Admission: RE | Admit: 2021-05-25 | Discharge: 2021-05-25 | Disposition: A | Discharge status: Home / Self Care | Payer: Medicaid Other | Source: Ambulatory Visit | Attending: Hematology and Oncology | Admitting: Hematology and Oncology

## 2021-05-25 DIAGNOSIS — C50212 Malignant neoplasm of upper-inner quadrant of left female breast: Secondary | ICD-10-CM | POA: Diagnosis not present

## 2021-05-25 DIAGNOSIS — I10 Essential (primary) hypertension: Secondary | ICD-10-CM | POA: Diagnosis not present

## 2021-05-25 DIAGNOSIS — Z01818 Encounter for other preprocedural examination: Secondary | ICD-10-CM | POA: Diagnosis not present

## 2021-05-25 DIAGNOSIS — Z171 Estrogen receptor negative status [ER-]: Secondary | ICD-10-CM | POA: Diagnosis present

## 2021-05-25 DIAGNOSIS — R0600 Dyspnea, unspecified: Secondary | ICD-10-CM | POA: Insufficient documentation

## 2021-05-25 DIAGNOSIS — Z0189 Encounter for other specified special examinations: Secondary | ICD-10-CM | POA: Diagnosis not present

## 2021-05-25 DIAGNOSIS — I427 Cardiomyopathy due to drug and external agent: Secondary | ICD-10-CM | POA: Diagnosis not present

## 2021-05-25 DIAGNOSIS — Z87891 Personal history of nicotine dependence: Secondary | ICD-10-CM | POA: Diagnosis not present

## 2021-05-25 LAB — ECHOCARDIOGRAM COMPLETE
AR max vel: 2.34 cm2
AV Area VTI: 2.11 cm2
AV Area mean vel: 2.27 cm2
AV Mean grad: 2 mmHg
AV Peak grad: 3.1 mmHg
Ao pk vel: 0.89 m/s
Area-P 1/2: 8.16 cm2
Calc EF: 59.4 %
S' Lateral: 2.8 cm
Single Plane A2C EF: 59.9 %
Single Plane A4C EF: 58.4 %

## 2021-05-29 LAB — MAGNESIUM: Magnesium: 1.5 mg/dL — ABNORMAL LOW (ref 1.6–2.3)

## 2021-06-02 ENCOUNTER — Other Ambulatory Visit: Payer: Self-pay

## 2021-06-10 ENCOUNTER — Other Ambulatory Visit: Payer: Self-pay | Admitting: *Deleted

## 2021-06-10 DIAGNOSIS — C50212 Malignant neoplasm of upper-inner quadrant of left female breast: Secondary | ICD-10-CM

## 2021-06-11 ENCOUNTER — Other Ambulatory Visit: Payer: Self-pay | Admitting: Hematology and Oncology

## 2021-06-11 ENCOUNTER — Telehealth: Payer: Self-pay

## 2021-06-11 ENCOUNTER — Other Ambulatory Visit: Payer: Self-pay

## 2021-06-11 ENCOUNTER — Inpatient Hospital Stay: Payer: Medicaid Other

## 2021-06-11 DIAGNOSIS — Z171 Estrogen receptor negative status [ER-]: Secondary | ICD-10-CM

## 2021-06-11 DIAGNOSIS — Z5112 Encounter for antineoplastic immunotherapy: Secondary | ICD-10-CM | POA: Diagnosis not present

## 2021-06-11 LAB — CMP (CANCER CENTER ONLY)
ALT: 13 U/L (ref 0–44)
AST: 22 U/L (ref 15–41)
Albumin: 3.3 g/dL — ABNORMAL LOW (ref 3.5–5.0)
Alkaline Phosphatase: 116 U/L (ref 38–126)
Anion gap: 6 (ref 5–15)
BUN: 8 mg/dL (ref 6–20)
CO2: 23 mmol/L (ref 22–32)
Calcium: 9.3 mg/dL (ref 8.9–10.3)
Chloride: 109 mmol/L (ref 98–111)
Creatinine: 0.63 mg/dL (ref 0.44–1.00)
GFR, Estimated: >60 mL/min (ref >60)
Glucose, Bld: 110 mg/dL — ABNORMAL HIGH (ref 70–99)
Potassium: 3.7 mmol/L (ref 3.5–5.1)
Sodium: 138 mmol/L (ref 135–145)
Total Bilirubin: 0.6 mg/dL (ref 0.3–1.2)
Total Protein: 6 g/dL — ABNORMAL LOW (ref 6.5–8.1)

## 2021-06-11 LAB — CBC WITH DIFFERENTIAL (CANCER CENTER ONLY)
Abs Immature Granulocytes: 0.01 10*3/uL (ref 0.00–0.07)
Basophils Absolute: 0 10*3/uL (ref 0.0–0.1)
Basophils Relative: 1 %
Eosinophils Absolute: 0.3 10*3/uL (ref 0.0–0.5)
Eosinophils Relative: 9 %
HCT: 29.2 % — ABNORMAL LOW (ref 36.0–46.0)
Hemoglobin: 10.1 g/dL — ABNORMAL LOW (ref 12.0–15.0)
Immature Granulocytes: 0 %
Lymphocytes Relative: 34 %
Lymphs Abs: 1.1 10*3/uL (ref 0.7–4.0)
MCH: 28.3 pg (ref 26.0–34.0)
MCHC: 34.6 g/dL (ref 30.0–36.0)
MCV: 81.8 fL (ref 80.0–100.0)
Monocytes Absolute: 0.4 10*3/uL (ref 0.1–1.0)
Monocytes Relative: 12 %
Neutro Abs: 1.4 10*3/uL — ABNORMAL LOW (ref 1.7–7.7)
Neutrophils Relative %: 44 %
Platelet Count: 188 10*3/uL (ref 150–400)
RBC: 3.57 MIL/uL — ABNORMAL LOW (ref 3.87–5.11)
RDW: 13.4 % (ref 11.5–15.5)
WBC Count: 3.1 10*3/uL — ABNORMAL LOW (ref 4.0–10.5)
nRBC: 0 % (ref 0.0–0.2)

## 2021-06-11 MED ORDER — TRAMADOL HCL 50 MG PO TABS: 50.0000 mg | ORAL_TABLET | Freq: Four times a day (QID) | ORAL | 0 refills | Status: DC | PRN

## 2021-06-11 NOTE — Telephone Encounter (Signed)
Notified Patient of Prior Authorization approval for Tramadol 50 mg Tablets. Medication is approved through 12/08/2021

## 2021-06-30 ENCOUNTER — Other Ambulatory Visit: Payer: Self-pay | Admitting: *Deleted

## 2021-06-30 DIAGNOSIS — Z171 Estrogen receptor negative status [ER-]: Secondary | ICD-10-CM

## 2021-06-30 DIAGNOSIS — C50212 Malignant neoplasm of upper-inner quadrant of left female breast: Secondary | ICD-10-CM

## 2021-06-30 NOTE — Progress Notes (Signed)
Patient Care Team: Muse, Noel Journey., PA-C as PCP - General Brien Mates, RN as Oncology Nurse Navigator (Oncology) Mauro Kaufmann, RN as Oncology Nurse Navigator Rockwell Germany, RN as Oncology Nurse Navigator Nicholas Lose, MD as Consulting Physician (Hematology and Oncology) Jovita Kussmaul, MD as Consulting Physician (General Surgery)  DIAGNOSIS:    ICD-10-CM   1. Malignant neoplasm of upper-inner quadrant of left breast in female, estrogen receptor negative ([place])  C50.212 CBC with Differential ([place] Only)   Z17.1 [place] ([place] only)      SUMMARY OF ONCOLOGIC HISTORY: Oncology History  Malignant neoplasm of upper-inner quadrant of left breast in female, estrogen receptor negative ([place])  01/2020 Initial Diagnosis   Palpable right breast mass growing since July 2021.  Mammogram revealed 2.2 cm right breast mass with a 0.8 cm satellite lesion.  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   03/31/2020 Cancer Staging   Staging form: Breast, AJCC 8th Edition - Clinical stage from 03/31/2020: Stage IIIB (cT2, cN1(f), cM0, G3, ER-, PR-, HER2-) - Signed by Nicholas Lose, MD on 04/02/2020    04/09/2020 - 08/06/2020 Chemotherapy    Patient is on Treatment Plan: BREAST AC Q21D / CARBOPLATIN D1 + PACLITAXEL D1,8,15 Q21D, Keytruda discontinued for pneumonitis and Taxol discontinued after 4 cycles because of failure to thrive       12/16/2020 Surgery   Bilateral mastectomies   Left mastectomy: Intraductal papilloma, UDH Right mastectomy: No residual carcinoma was 1/3 lymph nodes positive ER negative, PR +20%, HER2 2+ by IHC, positive by FISH ratio 2.03   02/05/2021 - 05/21/2021 Chemotherapy   Patient is on Treatment Plan : BREAST Trastuzumab q21d       CHIEF COMPLIANT: Stopping Herceptin  INTERVAL HISTORY: Sharon Fischer is a 56 y.o. with  above-mentioned history of  breast cancer who completed neoadjuvant chemotherapy and bilateral mastectomies. She presents to the clinic today for cycle 7 of Herceptin.  She reports that every time she takes Herceptin she is getting weaker and has been losing weight along with diffuse musculoskeletal aches and pains.  She wants to stop Herceptin treatment as of today.  She is currently in a wheelchair.  She is accompanied by her sister who reports that she has not been eating well and therefore losing weight.  Patient very mentally just denies that and tells me that she has been eating and that the treatments are making it difficult for her to gain weight.  ALLERGIES:  has No Known Allergies.  MEDICATIONS:  Current Outpatient Medications  Medication Sig Dispense Refill   acetaminophen (TYLENOL) 325 MG tablet Take 325 mg by mouth every 6 (six) hours as needed (FOR PAIN).     albuterol (VENTOLIN HFA) 108 (90 Base) MCG/ACT inhaler Inhale 1-2 puffs into the lungs every 4 (four) hours as needed for wheezing or shortness of breath. 8 g 2   atorvastatin (LIPITOR) 20 MG tablet Take 20 mg by mouth at bedtime.     carvedilol (COREG) 3.125 MG tablet Take 3.125 mg by mouth 2 (two) times daily.     Magnesium 400 MG CAPS Take 400 mg by mouth in the morning and at bedtime. 14 capsule 0   mometasone (ELOCON) 0.1 % ointment SMARTSIG:Topical Every 12 Hours     traMADol (ULTRAM) 50 MG tablet Take 1 tablet (50 mg total) by mouth  every 6 (six) hours as needed. 30 tablet 0   No current facility-administered medications for this visit.    PHYSICAL EXAMINATION: ECOG PERFORMANCE STATUS: 3 - Symptomatic, >50% confined to bed  Vitals:   07/01/21 1340 07/01/21 1341  BP: (!) 80/46 (!) 84/53  Pulse: 95   Resp: 17   Temp: (!) 97.3 F (36.3 C)   SpO2: 100%    Filed Weights   07/01/21 1340  Weight: 115 lb 14.4 oz (52.6 kg)      LABORATORY DATA:  I have reviewed the data as listed CMP Latest Ref Rng & Units  07/01/2021 06/11/2021 05/21/2021  Glucose 70 - 99 mg/dL 89 110(H) 71  BUN 6 - 20 mg/dL _0 Creatinine 0.44 - 1.00 mg/dL 0.69 0.63 0.71  Sodium 135 - 145 mmol/L 141 138 138  Potassium 3.5 - 5.1 mmol/L 3.6 3.7 3.9  Chloride 98 - 111 mmol/L 111 109 108  CO2 22 - 32 mmol/L _1 Calcium 8.9 - 10.3 mg/dL 9.6 9.3 8.9  Total Protein 6.5 - 8.1 g/dL 6.3(L) 6.0(L) 5.9(L)  Total Bilirubin 0.3 - 1.2 mg/dL 0.7 0.6 0.6  Alkaline Phos 38 - 126 U/L 122 116 115  AST 15 - 41 U/L _2 ALT 0 - 44 U/L _3 Lab Results  Component Value Date   WBC 3.0 (L) 07/01/2021   HGB 10.7 (L) 07/01/2021   HCT 31.9 (L) 07/01/2021   MCV 83.3 07/01/2021   PLT 225 07/01/2021   NEUTROABS 1.1 (L) 07/01/2021    ASSESSMENT & PLAN:  Malignant neoplasm of upper-inner quadrant of left breast in female, estrogen receptor negative ([place]) 01/2020: Palpable right breast mass growing since July 2021.  Mammogram revealed 2.2 cm right breast mass with a 0.8 cm satellite lesion.  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   Treatment plan  1. Neoadjuvant chemotherapy with Adriamycin and Cytoxan pembrolizumab 4 followed by Taxol weekly 4 with carboplatin pembrolizumab every 3 weeks x3 (chemo discontinued due to toxicities) Keytruda discontinued because of pneumonitis 2. bilateral mastectomies 12/16/2020: Left mastectomy intraductal papilloma, right mastectomy, no residual cancer in the breast 1/3 lymph nodes positive, ER negative, PR positive, HER2 2+ by IHC, FISH positive ratio 2.03, copy #6.2 3. Followed by adjuvant radiation therapy 4.  Herceptin every 3 weeks for 1 year started 02/05/2021 discontinued December 2022 -------------------------------------------------------------------------------------------------------------------------------------- Current treatment: Discontinuing Herceptin because of  poor tolerance   Shortness of breath: CT angiogram 03/19/2021: Negative for PE Echocardiogram 01/13/2021: EF 55 to 60%     We plan to give her IV fluids today and discontinue Herceptin. Return to clinic in 1 year for follow-up    Orders Placed This Encounter  Procedures   CBC with Differential ([place] Only)    Standing Status:   Future    Standing Expiration Date:   07/01/2022   CMP ([place] [place] [facility] only)    Standing Status:   Future    Standing Expiration Date:   07/01/2022   The patient has a good understanding of the overall plan. she agrees with it. she will call with any problems that may develop before the next visit here.  Total time spent: 30 mins including face to face time and time spent for planning, charting and coordination of care  Rulon Eisenmenger, MD, MPH 07/01/2021  I, Thana Ates, am acting as scribe for Dr. Nicholas Lose.  I have reviewed the above documentation for accuracy and completeness, and I agree with the above.

## 2021-07-01 ENCOUNTER — Other Ambulatory Visit: Payer: Self-pay

## 2021-07-01 ENCOUNTER — Inpatient Hospital Stay: Payer: Medicaid Other

## 2021-07-01 ENCOUNTER — Encounter: Payer: Self-pay | Admitting: *Deleted

## 2021-07-01 ENCOUNTER — Inpatient Hospital Stay: Payer: Medicaid Other | Admitting: Hematology and Oncology

## 2021-07-01 ENCOUNTER — Other Ambulatory Visit: Payer: Self-pay | Admitting: *Deleted

## 2021-07-01 ENCOUNTER — Inpatient Hospital Stay: Payer: Medicaid Other | Attending: Hematology and Oncology

## 2021-07-01 DIAGNOSIS — Z171 Estrogen receptor negative status [ER-]: Secondary | ICD-10-CM | POA: Insufficient documentation

## 2021-07-01 DIAGNOSIS — R627 Adult failure to thrive: Secondary | ICD-10-CM | POA: Diagnosis not present

## 2021-07-01 DIAGNOSIS — Z7951 Long term (current) use of inhaled steroids: Secondary | ICD-10-CM | POA: Diagnosis not present

## 2021-07-01 DIAGNOSIS — Z9013 Acquired absence of bilateral breasts and nipples: Secondary | ICD-10-CM | POA: Diagnosis not present

## 2021-07-01 DIAGNOSIS — Z9221 Personal history of antineoplastic chemotherapy: Secondary | ICD-10-CM | POA: Insufficient documentation

## 2021-07-01 DIAGNOSIS — Z79899 Other long term (current) drug therapy: Secondary | ICD-10-CM | POA: Insufficient documentation

## 2021-07-01 DIAGNOSIS — C50212 Malignant neoplasm of upper-inner quadrant of left female breast: Secondary | ICD-10-CM | POA: Diagnosis present

## 2021-07-01 DIAGNOSIS — Z923 Personal history of irradiation: Secondary | ICD-10-CM | POA: Insufficient documentation

## 2021-07-01 LAB — CBC WITH DIFFERENTIAL (CANCER CENTER ONLY)
Abs Immature Granulocytes: 0 10*3/uL (ref 0.00–0.07)
Basophils Absolute: 0 10*3/uL (ref 0.0–0.1)
Basophils Relative: 1 %
Eosinophils Absolute: 0.4 10*3/uL (ref 0.0–0.5)
Eosinophils Relative: 13 %
HCT: 31.9 % — ABNORMAL LOW (ref 36.0–46.0)
Hemoglobin: 10.7 g/dL — ABNORMAL LOW (ref 12.0–15.0)
Immature Granulocytes: 0 %
Lymphocytes Relative: 40 %
Lymphs Abs: 1.2 10*3/uL (ref 0.7–4.0)
MCH: 27.9 pg (ref 26.0–34.0)
MCHC: 33.5 g/dL (ref 30.0–36.0)
MCV: 83.3 fL (ref 80.0–100.0)
Monocytes Absolute: 0.3 10*3/uL (ref 0.1–1.0)
Monocytes Relative: 9 %
Neutro Abs: 1.1 10*3/uL — ABNORMAL LOW (ref 1.7–7.7)
Neutrophils Relative %: 37 %
Platelet Count: 225 10*3/uL (ref 150–400)
RBC: 3.83 MIL/uL — ABNORMAL LOW (ref 3.87–5.11)
RDW: 13.5 % (ref 11.5–15.5)
WBC Count: 3 10*3/uL — ABNORMAL LOW (ref 4.0–10.5)
nRBC: 0 % (ref 0.0–0.2)

## 2021-07-01 LAB — CMP (CANCER CENTER ONLY)
ALT: 11 U/L (ref 0–44)
AST: 19 U/L (ref 15–41)
Albumin: 3.6 g/dL (ref 3.5–5.0)
Alkaline Phosphatase: 122 U/L (ref 38–126)
Anion gap: 6 (ref 5–15)
BUN: 11 mg/dL (ref 6–20)
CO2: 24 mmol/L (ref 22–32)
Calcium: 9.6 mg/dL (ref 8.9–10.3)
Chloride: 111 mmol/L (ref 98–111)
Creatinine: 0.69 mg/dL (ref 0.44–1.00)
GFR, Estimated: >60 mL/min (ref >60)
Glucose, Bld: 89 mg/dL (ref 70–99)
Potassium: 3.6 mmol/L (ref 3.5–5.1)
Sodium: 141 mmol/L (ref 135–145)
Total Bilirubin: 0.7 mg/dL (ref 0.3–1.2)
Total Protein: 6.3 g/dL — ABNORMAL LOW (ref 6.5–8.1)

## 2021-07-01 MED ORDER — SODIUM CHLORIDE 0.9 % IV SOLN: Freq: Once | INTRAVENOUS | Status: AC

## 2021-07-01 NOTE — Assessment & Plan Note (Signed)
01/2020:Palpable right breast mass growing since July 2021. Mammogram revealed 2.2 cm right breast mass with a 0.8 cm satellite lesion. 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  Treatment plan 1. 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 bilateral mastectomies 12/16/2020: Left mastectomy intraductal papilloma, right mastectomy, no residual cancer in the breast 1/3 lymph nodes positive, ER negative, PR positive, HER2 2+ by IHC, FISH positive ratio 2.03, copy #6.2 3. Followed by adjuvant radiation therapy 4.  Herceptin every 3 weeks for 1 year started 02/05/2021 -------------------------------------------------------------------------------------------------------------------------------------- Current treatment: Herceptin maintenance to be completed August 2023  Shortness of breath: CT angiogram 03/19/2021: Negative for PE Echocardiogram 01/13/2021: EF 55 to 60% Another echocardiogram is scheduled next week.    Return to clinic every 3 weeks for Herceptin every 6 weeks to follow-up with myself or Dr. Detavious Rinn. 

## 2021-07-01 NOTE — Patient Instructions (Signed)

## 2021-07-02 ENCOUNTER — Telehealth: Payer: Self-pay | Admitting: Hematology and Oncology

## 2021-07-02 ENCOUNTER — Encounter (HOSPITAL_COMMUNITY): Payer: Self-pay

## 2021-07-02 NOTE — Telephone Encounter (Signed)
Scheduled appointment per 01/19 los. Patient aware.

## 2021-07-22 ENCOUNTER — Encounter: Payer: Self-pay | Admitting: *Deleted

## 2021-07-22 ENCOUNTER — Ambulatory Visit: Payer: Medicaid Other

## 2021-07-22 DIAGNOSIS — Z171 Estrogen receptor negative status [ER-]: Secondary | ICD-10-CM

## 2021-07-27 ENCOUNTER — Emergency Department (HOSPITAL_COMMUNITY): Payer: Medicaid Other

## 2021-07-27 ENCOUNTER — Encounter (HOSPITAL_COMMUNITY): Payer: Self-pay

## 2021-07-27 ENCOUNTER — Other Ambulatory Visit: Payer: Self-pay

## 2021-07-27 ENCOUNTER — Emergency Department (HOSPITAL_COMMUNITY)
Admission: EM | Admit: 2021-07-27 | Discharge: 2021-07-27 | Disposition: A | Discharge status: Home / Self Care | Payer: Medicaid Other | Attending: Emergency Medicine | Admitting: Emergency Medicine

## 2021-07-27 DIAGNOSIS — Z79899 Other long term (current) drug therapy: Secondary | ICD-10-CM | POA: Diagnosis not present

## 2021-07-27 DIAGNOSIS — R0602 Shortness of breath: Secondary | ICD-10-CM | POA: Insufficient documentation

## 2021-07-27 DIAGNOSIS — I10 Essential (primary) hypertension: Secondary | ICD-10-CM | POA: Insufficient documentation

## 2021-07-27 DIAGNOSIS — Z20822 Contact with and (suspected) exposure to covid-19: Secondary | ICD-10-CM | POA: Insufficient documentation

## 2021-07-27 DIAGNOSIS — Z853 Personal history of malignant neoplasm of breast: Secondary | ICD-10-CM | POA: Insufficient documentation

## 2021-07-27 LAB — CBC WITH DIFFERENTIAL/PLATELET
Band Neutrophils: 1 %
Basophils Absolute: 0 10*3/uL (ref 0.0–0.1)
Basophils Relative: 0 %
Blasts: 1 %
Eosinophils Absolute: 0.2 10*3/uL (ref 0.0–0.5)
Eosinophils Relative: 5 %
HCT: 32.1 % — ABNORMAL LOW (ref 36.0–46.0)
Hemoglobin: 10.3 g/dL — ABNORMAL LOW (ref 12.0–15.0)
Immature Granulocytes: 0 %
Lymphocytes Relative: 59 %
Lymphs Abs: 1.3 10*3/uL (ref 0.7–4.0)
MCH: 27.7 pg (ref 26.0–34.0)
MCHC: 32.1 g/dL (ref 30.0–36.0)
MCV: 86.3 fL (ref 80.0–100.0)
Metamyelocytes Relative: 2 %
Monocytes Absolute: 0.3 10*3/uL (ref 0.1–1.0)
Monocytes Relative: 4 %
Myelocytes: 0 %
Neutro Abs: 0.7 10*3/uL — ABNORMAL LOW (ref 1.7–7.7)
Neutrophils Relative %: 28 %
Other: 0 %
Platelets: 218 10*3/uL (ref 150–400)
Promyelocytes Relative: 0 %
RBC Morphology: 0
RBC: 3.72 MIL/uL — ABNORMAL LOW (ref 3.87–5.11)
RDW: 13.7 % (ref 11.5–15.5)
Smear Review: 0
WBC Morphology: 0
WBC: 2.7 10*3/uL — ABNORMAL LOW (ref 4.0–10.5)
nRBC: 0 % (ref 0.0–0.2)
nRBC: 0 /100 WBC

## 2021-07-27 LAB — I-STAT CHEM 8, ED
BUN: 4 mg/dL — ABNORMAL LOW (ref 6–20)
Calcium, Ion: 1.22 mmol/L (ref 1.15–1.40)
Chloride: 103 mmol/L (ref 98–111)
Creatinine, Ser: 0.6 mg/dL (ref 0.44–1.00)
Glucose, Bld: 77 mg/dL (ref 70–99)
HCT: 34 % — ABNORMAL LOW (ref 36.0–46.0)
Hemoglobin: 11.6 g/dL — ABNORMAL LOW (ref 12.0–15.0)
Potassium: 4 mmol/L (ref 3.5–5.1)
Sodium: 138 mmol/L (ref 135–145)
TCO2: 24 mmol/L (ref 22–32)

## 2021-07-27 LAB — COMPREHENSIVE METABOLIC PANEL
ALT: 20 U/L (ref 0–44)
AST: 34 U/L (ref 15–41)
Albumin: 3.5 g/dL (ref 3.5–5.0)
Alkaline Phosphatase: 132 U/L — ABNORMAL HIGH (ref 38–126)
Anion gap: 10 (ref 5–15)
BUN: 7 mg/dL (ref 6–20)
CO2: 20 mmol/L — ABNORMAL LOW (ref 22–32)
Calcium: 9 mg/dL (ref 8.9–10.3)
Chloride: 104 mmol/L (ref 98–111)
Creatinine, Ser: 0.63 mg/dL (ref 0.44–1.00)
GFR, Estimated: >60 mL/min (ref >60)
Glucose, Bld: 81 mg/dL (ref 70–99)
Potassium: 3.8 mmol/L (ref 3.5–5.1)
Sodium: 134 mmol/L — ABNORMAL LOW (ref 135–145)
Total Bilirubin: 0.6 mg/dL (ref 0.3–1.2)
Total Protein: 6.3 g/dL — ABNORMAL LOW (ref 6.5–8.1)

## 2021-07-27 LAB — RESP PANEL BY RT-PCR (FLU A&B, COVID) ARPGX2
Influenza A by PCR: NEGATIVE
Influenza B by PCR: NEGATIVE
SARS Coronavirus 2 by RT PCR: NEGATIVE

## 2021-07-27 LAB — BRAIN NATRIURETIC PEPTIDE: B Natriuretic Peptide: 35 pg/mL (ref 0.0–100.0)

## 2021-07-27 LAB — TROPONIN I (HIGH SENSITIVITY)
Troponin I (High Sensitivity): 8 ng/L (ref <18)
Troponin I (High Sensitivity): 11 ng/L (ref <18)

## 2021-07-27 LAB — MAGNESIUM: Magnesium: 1.5 mg/dL — ABNORMAL LOW (ref 1.7–2.4)

## 2021-07-27 MED ORDER — IOHEXOL 350 MG/ML SOLN
75.0000 mL | Freq: Once | INTRAVENOUS | Status: AC | PRN
  Administered 2021-07-27: 75 mL via INTRAVENOUS

## 2021-07-27 MED ORDER — MAGNESIUM SULFATE 2 GM/50ML IV SOLN
2.0000 g | Freq: Once | INTRAVENOUS | Status: AC
  Administered 2021-07-27: 2 g via INTRAVENOUS
  Filled 2021-07-27: qty 50

## 2021-07-27 MED ORDER — LORATADINE 10 MG PO TABS
10.0000 mg | ORAL_TABLET | Freq: Once | ORAL | Status: AC
Start: 2021-07-27 — End: 2021-07-27
  Administered 2021-07-27: 10 mg via ORAL
  Filled 2021-07-27: qty 1

## 2021-07-27 MED ORDER — ALBUTEROL SULFATE HFA 108 (90 BASE) MCG/ACT IN AERS
4.0000 | INHALATION_SPRAY | Freq: Once | RESPIRATORY_TRACT | Status: AC
  Administered 2021-07-27: 4 via RESPIRATORY_TRACT
  Filled 2021-07-27: qty 6.7

## 2021-07-27 NOTE — ED Triage Notes (Signed)
Pt presents to ED with complaints of increased shortness of breath started this am. Pt states she was sitting watching TV when it started.

## 2021-07-27 NOTE — ED Notes (Signed)
Pt verbalized understanding of discharge paperwork.

## 2021-07-27 NOTE — ED Notes (Signed)
Went to pt room to do walking stat per [employer]. Pt said the DR told her she was getting a  treatment. And she didn't get it and she wants to go home to take her IV out. Tried to explain treatment was ordered. She refuses to walks and wants to go home

## 2021-07-27 NOTE — ED Notes (Signed)
Pt O2 95% on RA while ambulating.

## 2021-07-27 NOTE — ED Notes (Signed)
Patient transported to CT 

## 2021-07-27 NOTE — ED Provider Notes (Signed)
[facility] Provider Note   CSN: [id number] Arrival date & time: 07/27/21  1009     History  Chief Complaint  Patient presents with   Shortness of Breath    Sharon Fischer is a 56 y.o. female.  HPI Patient presents for shortness of breath.  Medical history includes breast cancer, HTN, HLD.  Breast cancer was diagnosed 8/21.  She is s/p chemotherapy, bilateral mastectomies, and radiation.  She is no longer undergoing any therapeutic interventions.  She was last seen by oncology 4 weeks ago.  Oncology plan is for 1 year follow-up.  Echocardiogram 2 months ago showed normal cardiac function.  This morning, she was in her normal state of health.  While at home, she had the cute onset of shortness of breath.  She states that she has had similar episodes in the past.  She describes it as a feeling of tightness on the left side of her chest wall.  This causes her difficulty breathing.  Since time of onset, patient's symptoms have improved.  She does still continue to feel mildly short of breath.  She has never been told that she has COPD in the past.  She does have an inhaler at home that she does not use.    Home Medications Prior to Admission medications   Medication Sig Start Date End Date Taking? Authorizing Provider  acetaminophen (TYLENOL) 325 MG tablet Take 325 mg by mouth every 6 (six) hours as needed (FOR PAIN).    [provider]  albuterol (VENTOLIN HFA) 108 (90 Base) MCG/ACT inhaler Inhale 1-2 puffs into the lungs every 4 (four) hours as needed for wheezing or shortness of breath. 08/12/20   Tanner, Lyndon Code., PA-C  atorvastatin (LIPITOR) 20 MG tablet Take 20 mg by mouth at bedtime. 03/17/21   [provider]  carvedilol (COREG) 3.125 MG tablet Take 3.125 mg by mouth 2 (two) times daily. 12/04/20   [provider]  Magnesium 400 MG CAPS Take 400 mg by mouth in the morning and at bedtime. 05/13/21   Tobb, Kardie, DO  mometasone (ELOCON) 0.1 %  ointment SMARTSIG:Topical Every 12 Hours 03/26/21   [provider]  traMADol (ULTRAM) 50 MG tablet Take 1 tablet (50 mg total) by mouth every 6 (six) hours as needed. 06/11/21   Nicholas Lose, MD  prochlorperazine (COMPAZINE) 10 MG tablet Take 1 tablet (10 mg total) by mouth every 6 (six) hours as needed (Nausea or vomiting). Patient not taking: Reported on 12/03/2020 10/13/20 12/24/20  Nicholas Lose, MD      Allergies    Patient has no known allergies.    Review of Systems   Review of Systems  Respiratory:  Positive for chest tightness and shortness of breath.   All other systems reviewed and are negative.  Physical Exam Updated Vital Signs BP (!) 150/88    Pulse 88    Temp 98.6 F (37 C) (Oral)    Resp 16    Ht 5' 6.5" (1.689 m)    Wt 56.7 kg    SpO2 98%    BMI 19.87 kg/m  Physical Exam Vitals and nursing note reviewed.  Constitutional:      General: She is not in acute distress.    Appearance: She is well-developed. She is not ill-appearing, toxic-appearing or diaphoretic.  HENT:     Head: Normocephalic and atraumatic.     Mouth/Throat:     Mouth: Mucous membranes are moist.     Pharynx: Oropharynx is  clear.  Eyes:     Conjunctiva/sclera: Conjunctivae normal.  Cardiovascular:     Rate and Rhythm: Normal rate and regular rhythm.     Heart sounds: No murmur heard. Pulmonary:     Effort: Pulmonary effort is normal. No tachypnea, accessory muscle usage or respiratory distress.     Breath sounds: No decreased breath sounds, wheezing, rhonchi or rales.  Chest:     Chest wall: No mass or tenderness.  Abdominal:     Palpations: Abdomen is soft.     Tenderness: There is no abdominal tenderness.  Musculoskeletal:        General: No swelling. Normal range of motion.     Cervical back: Normal range of motion and neck supple.     Right lower leg: No edema.     Left lower leg: No edema.  Skin:    General: Skin is warm and dry.     Capillary Refill: Capillary refill takes  less than 2 seconds.  Neurological:     General: No focal deficit present.     Mental Status: She is alert and oriented to person, place, and time.     Cranial Nerves: No cranial nerve deficit.     Motor: No weakness.  Psychiatric:        Mood and Affect: Mood normal.        Behavior: Behavior normal.    ED Results / Procedures / Treatments   Labs (all labs ordered are listed, but only abnormal results are displayed) Labs Reviewed  COMPREHENSIVE METABOLIC PANEL - Abnormal; Notable for the following components:      Result Value   Sodium 134 (*)    CO2 20 (*)    Total Protein 6.3 (*)    Alkaline Phosphatase 132 (*)    All other components within normal limits  CBC WITH DIFFERENTIAL/PLATELET - Abnormal; Notable for the following components:   WBC 2.7 (*)    RBC 3.72 (*)    Hemoglobin 10.3 (*)    HCT 32.1 (*)    Neutro Abs 0.7 (*)    All other components within normal limits  MAGNESIUM - Abnormal; Notable for the following components:   Magnesium 1.5 (*)    All other components within normal limits  I-STAT CHEM 8, ED - Abnormal; Notable for the following components:   BUN 4 (*)    Hemoglobin 11.6 (*)    HCT 34.0 (*)    All other components within normal limits  RESP PANEL BY RT-PCR (FLU A&B, COVID) ARPGX2  BRAIN NATRIURETIC PEPTIDE  PATHOLOGIST SMEAR REVIEW  TROPONIN I (HIGH SENSITIVITY)  TROPONIN I (HIGH SENSITIVITY)    EKG EKG Interpretation  Date/Time:  Tuesday July 27 2021 11:17:33 EST Ventricular Rate:  86 PR Interval:  185 QRS Duration: 95 QT Interval:  427 QTC Calculation: 508 R Axis:   42 Text Interpretation: Sinus rhythm Anteroseptal infarct, age indeterminate Confirmed by Godfrey Pick [phone]) on 07/27/2021 11:33:21 AM  Radiology No results found.  Procedures Procedures    Medications Ordered in ED Medications  iohexol (OMNIPAQUE) 350 MG/ML injection 75 mL (75 mLs Intravenous Contrast Given 07/27/21 1201)  magnesium sulfate IVPB 2 g 50 mL (0 g  Intravenous Stopped 07/27/21 1509)  loratadine (CLARITIN) tablet 10 mg (10 mg Oral Given 07/27/21 1509)  albuterol (VENTOLIN HFA) 108 (90 Base) MCG/ACT inhaler 4 puff (4 puffs Inhalation Given 07/27/21 1509)    ED Course/ Medical Decision Making/ A&P  Medical Decision Making Amount and/or Complexity of Data Reviewed Labs: ordered. Radiology: ordered.  Risk OTC drugs. Prescription drug management.   This patient presents to the ED for concern of shortness of breath, this involves an extensive number of treatment options, and is a complaint that carries with it a high risk of complications and morbidity.  The differential diagnosis includes pulmonary edema, PE, ACS, pneumonia   Co morbidities that complicate the patient evaluation  breast cancer, HTN, HLD   Additional history obtained:  Additional history obtained from patient's sister External records from outside source obtained and reviewed including EMR   Lab Tests:  I Ordered, and personally interpreted labs.  The pertinent results include: Baseline normocytic anemia, baseline leukopenia, hypomagnesemia, otherwise normal electrolytes, normal troponin, normal BNP, COVID/flu negative   Imaging Studies ordered:  I ordered imaging studies including chest x-ray, CTA chest I independently visualized and interpreted imaging which showed chronic findings consistent with COPD, small right pleural effusion I agree with the radiologist interpretation   Cardiac Monitoring:  The patient was maintained on a cardiac monitor.  I personally viewed and interpreted the cardiac monitored which showed an underlying rhythm of: Sinus rhythm   Medicines ordered and prescription drug management:  I ordered medication including magnesium sulfate for hypomagnesemia; Claritin and albuterol for symptomatic relief Reevaluation of the patient after these medicines showed that the patient improved I have reviewed the  patients home medicines and have made adjustments as needed   Problem List / ED Course:  Patient is a 56 year old female with history of breast cancer, presenting for cute onset of shortness of breath today.  Onset of symptoms occurred at rest.  Since onset, patient's symptoms have improved.  On arrival in the ED, she has normal vital signs.  Her breathing appears unlabored.  SPO2 is normal on room air.  She does continue to endorse some mild subjective shortness of breath.  She states that she has had similar episodes in the past.  During most previous episodes, she was evaluated in the emergency department and did not require hospital admission.  Given her history of cancer, there is concern of PE and/or metastases that may have contributed to symptoms.  Additionally, she does have risk factors for ACS.  Patient underwent work-up in the ED which was notable for hypomagnesemia.  IV magnesium was given.  Patient's cardiac enzymes and PE study were negative for acute findings to explain her symptoms.  While in the ED, patient had continued improvement and felt her breathing was back to baseline.  CT of chest did show a chronic findings consistent with COPD.  Patient states that she has never been told that she has COPD.  She does not utilize any breathing treatments at home.  She does have a smoking history.  Patient was given albuterol inhaler to take at home as needed.  Given clear lung sounds today, I do not feel that she needs any other therapeutic interventions.  Given her reassuring work-up and resolution of symptoms, patient is stable for discharge at this time.   Reevaluation:  After the interventions noted above, I reevaluated the patient and found that they have :resolved  Dispostion:  After consideration of the diagnostic results and the patients response to treatment, I feel that the patent would benefit from discharge.          Final Clinical Impression(s) / ED Diagnoses Final  diagnoses:  SOB (shortness of breath)  Hypomagnesemia    Rx / DC Orders ED Discharge  Orders     None         Godfrey Pick, MD 07/29/21 1236

## 2021-07-29 LAB — PATHOLOGIST SMEAR REVIEW

## 2021-08-23 ENCOUNTER — Encounter (HOSPITAL_COMMUNITY): Payer: Self-pay

## 2021-08-23 ENCOUNTER — Emergency Department (HOSPITAL_COMMUNITY): Payer: Medicaid Other

## 2021-08-23 ENCOUNTER — Other Ambulatory Visit: Payer: Self-pay

## 2021-08-23 ENCOUNTER — Emergency Department (HOSPITAL_COMMUNITY)
Admission: EM | Admit: 2021-08-23 | Discharge: 2021-08-23 | Disposition: A | Discharge status: Home / Self Care | Payer: Medicaid Other | Attending: Emergency Medicine | Admitting: Emergency Medicine

## 2021-08-23 DIAGNOSIS — R519 Headache, unspecified: Secondary | ICD-10-CM | POA: Diagnosis present

## 2021-08-23 DIAGNOSIS — I10 Essential (primary) hypertension: Secondary | ICD-10-CM | POA: Insufficient documentation

## 2021-08-23 DIAGNOSIS — Z79899 Other long term (current) drug therapy: Secondary | ICD-10-CM | POA: Diagnosis not present

## 2021-08-23 HISTORY — DX: Pure hypercholesterolemia, unspecified: E78.00

## 2021-08-23 NOTE — ED Triage Notes (Addendum)
Patient c/o headache and nausea since last night . Patient states she is not currently receiving chemo. Patient has a history of breast cancer. Patient denies any blurred vision. ?

## 2021-08-23 NOTE — ED Provider Triage Note (Signed)
Emergency Medicine Provider Triage Evaluation Note ? ?Sharon Fischer , a 56 y.o. female  was evaluated in triage.  Pt complains of scalp tenderness since last night.  Patient reports pain to the crown of her head, exacerbated with any palpation.  Patient does have a known history of cancer.  She does feel her head feels somewhat heavy.  She is currently not taking any gabapentin, no medication for pain control. ? ?Review of Systems  ?Positive: Scalp pain ?Negative: Nausea, vomiting, changes in vision ? ?Physical Exam  ?BP 127/82 (BP Location: Right Arm)   Pulse (!) 117   Temp 98.6 ?F (37 ?C) (Oral)   Resp 14   Ht 5' 6.5" (1.689 m)   Wt 56.7 kg   SpO2 99%   BMI 19.87 kg/m?  ?Gen:   Awake, teary-eyed on exam. ?Resp:  Normal effort  ?MSK:   Moves extremities without difficulty  ?Other:   ? ?Medical Decision Making  ?Medically screening exam initiated at 3:10 PM.  Appropriate orders placed.  Sharon Fischer was informed that the remainder of the evaluation will be completed by another provider, this initial triage assessment does not replace that evaluation, and the importance of remaining in the ED until their evaluation is complete. ? ? ?  ?Janeece Fitting, PA-C ?08/23/21 1511 ? ?

## 2021-08-23 NOTE — ED Provider Notes (Incomplete)
[facility] DEPT Provider Note   CSN: [id number] Arrival date & time: 08/23/21  1403     History {Add pertinent medical, surgical, social history, OB history to HPI:1} Chief Complaint  Patient presents with   Headache    Sharon Fischer is a 56 y.o. female with history of malignant neoplasm of left breast (status post bilateral mastectomies, currently on chemotherapy with Trastuzumab last infusion 07/01/21), hypertension, hyperlipidemia.  Presents the emergency department with a complaint of headache.  Patient reports that this morning at 1 AM she woke up due to headache.  Patient complains of burning sensation to the occipital region of her her scalp and a pressure to occipital region of head.  Patient reports that the symptoms have been intermittent since then.  Patient reports random onset.  Denies any aggravating factors.  Patient has not tried any medication to leave her symptoms.  Patient endorses baseline numbness to bilateral toes and fingers; this is unchanged.  Denies any fever, chills, weakness, facial asymmetry, dysarthria, visual disturbance, seizures, lightheadedness, syncope, neck pain, neck stiffness, back pain, recent falls or injuries.   Headache Associated symptoms: numbness   Associated symptoms: no abdominal pain, no back pain, no dizziness, no fever, no nausea, no neck pain, no seizures, no vomiting and no weakness       Home Medications Prior to Admission medications   Medication Sig Start Date End Date Taking? Authorizing Provider  acetaminophen (TYLENOL) 325 MG tablet Take 325 mg by mouth every 6 (six) hours as needed (FOR PAIN).    [provider]  albuterol (VENTOLIN HFA) 108 (90 Base) MCG/ACT inhaler Inhale 1-2 puffs into the lungs every 4 (four) hours as needed for wheezing or shortness of breath. 08/12/20   Tanner, Lyndon Code., PA-C  atorvastatin (LIPITOR) 20 MG tablet Take 20 mg by mouth at bedtime. 03/17/21   [provider]  carvedilol (COREG) 3.125 MG tablet Take 3.125 mg by mouth 2 (two) times daily. 12/04/20   [provider]  Magnesium 400 MG CAPS Take 400 mg by mouth in the morning and at bedtime. 05/13/21   Tobb, Kardie, DO  mometasone (ELOCON) 0.1 % ointment SMARTSIG:Topical Every 12 Hours 03/26/21   [provider]  traMADol (ULTRAM) 50 MG tablet Take 1 tablet (50 mg total) by mouth every 6 (six) hours as needed. 06/11/21   Nicholas Lose, MD  prochlorperazine (COMPAZINE) 10 MG tablet Take 1 tablet (10 mg total) by mouth every 6 (six) hours as needed (Nausea or vomiting). Patient not taking: Reported on 12/03/2020 10/13/20 12/24/20  Nicholas Lose, MD      Allergies    Patient has no known allergies.    Review of Systems   Review of Systems  Constitutional:  Negative for chills and fever.  Eyes:  Negative for visual disturbance.  Respiratory:  Negative for shortness of breath.   Cardiovascular:  Negative for chest pain.  Gastrointestinal:  Negative for abdominal pain, nausea and vomiting.  Genitourinary:  Negative for difficulty urinating.  Musculoskeletal:  Negative for back pain and neck pain.  Skin:  Negative for color change and rash.  Neurological:  Positive for numbness and headaches. Negative for dizziness, tremors, seizures, syncope, facial asymmetry, speech difficulty, weakness and light-headedness.  Psychiatric/Behavioral:  Negative for confusion.    Physical Exam Updated Vital Signs BP 121/73    Pulse 87    Temp 98.6 F (37 C) (Oral)    Resp 14    Ht 5' 6.5" (1.689 m)  Wt 56.7 kg    SpO2 100%    BMI 19.87 kg/m  Physical Exam Vitals and nursing note reviewed.  Constitutional:      General: She is not in acute distress.    Appearance: She is not ill-appearing, toxic-appearing or diaphoretic.  HENT:     Head: Normocephalic and atraumatic.     Comments: Patient has no rash or lesions to scalp Eyes:     General:        Right eye: No discharge.        Left  eye: No discharge.     Extraocular Movements: Extraocular movements intact.     Conjunctiva/sclera: Conjunctivae normal.     Pupils: Pupils are equal, round, and reactive to light.  Cardiovascular:     Rate and Rhythm: Normal rate.  Pulmonary:     Effort: Pulmonary effort is normal.  Musculoskeletal:     Cervical back: Normal range of motion and neck supple. No rigidity.     Comments: No midline tenderness deformity to cervical, thoracic, lumbar spine  Skin:    General: Skin is warm and dry.  Neurological:     General: No focal deficit present.     Mental Status: She is alert and oriented to person, place, and time.     GCS: GCS eye subscore is 4. GCS verbal subscore is 5. GCS motor subscore is 6.     Cranial Nerves: No cranial nerve deficit or facial asymmetry.     Sensory: Sensation is intact.     Motor: No weakness, tremor, seizure activity or pronator drift.     Coordination: Finger-Nose-Finger Test normal.     Gait: Gait is intact. Gait normal.     Comments: CN II-XII intact, equal grip strength, +5 strength to bilateral upper and lower extremities.  Patient has sensation grossly intact to bilateral lower extremities.  Patient has sensation intact to bilateral upper lower extremities however neglect present to right side.  Psychiatric:        Behavior: Behavior is cooperative.    ED Results / Procedures / Treatments   Labs (all labs ordered are listed, but only abnormal results are displayed) Labs Reviewed - No data to display  EKG None  Radiology No results found.  Procedures Procedures  {Document cardiac monitor, telemetry assessment procedure when appropriate:1}  Medications Ordered in ED Medications - No data to display  ED Course/ Medical Decision Making/ A&P                           Medical Decision Making Amount and/or Complexity of Data Reviewed Radiology: ordered.   Alert 56 year old female no acute stress, nontoxic-appearing.  Presents emergency  department with a chief complaint of headache and burning sensation to scalp.  Information is obtained from patient patient's sister at bedside.  Past medical records reviewed including previous chart and notes, labs, and imaging.  Patient has past medical history as outlined in HPI which complicates her care.  Due to reports of new headache in a patient greater than 50 with active treatment for breast cancer concern for possible intracranial malignancy causing patient's symptoms.  Will obtain noncontrast head CT.  Patient was offered migraine cocktail however refuses at this time.  Due to reports of burning sensation I considered possible herpes zoster.  Patient has no active rash or lesions at this time  {Document critical care time when appropriate:1} {Document review of labs and clinical decision tools ie heart  score, Chads2Vasc2 etc:1}  {Document your independent review of radiology images, and any outside records:1} {Document your discussion with family members, caretakers, and with consultants:1} {Document social determinants of health affecting pt's care:1} {Document your decision making why or why not admission, treatments were needed:1} Final Clinical Impression(s) / ED Diagnoses Final diagnoses:  None    Rx / DC Orders ED Discharge Orders     None

## 2021-08-23 NOTE — Discharge Instructions (Addendum)
You came to the Emergency Department today to be evaluated for your headache and scalp pain.  Your physical exam was reassuring.  The CT scan of your head did not show any acute abnormalities.  You were offered medications to help with your headache however declined.  You were offered for an MRI for further evaluation however declined.  Please follow-up closely with your primary care provider. ? ?Please take Tylenol (acetaminophen) to relieve your pain.  You make take tylenol, up to 1,000 mg (two extra strength pills) every 8 hours as needed.  Do not take more than 3,000 mg tylenol in a 24 hour period (not more than one dose every 8 hours.  Please check all medication labels as many medications such as pain and cold medications may contain tylenol.  Do not drink alcohol while taking these medications. ? ?Get help right away if: ?Your headache: ?Becomes severe quickly. ?Gets worse after moderate to intense physical activity. ?You have any of these symptoms: ?Repeated vomiting. ?Pain or stiffness in your neck. ?Changes to your vision. ?Pain in an eye or ear. ?Problems with speech. ?Muscular weakness or loss of muscle control. ?Loss of balance or coordination. ?You feel faint or pass out. ?You have confusion. ?You have a seizure. ?

## 2021-08-24 ENCOUNTER — Telehealth: Payer: Self-pay | Admitting: *Deleted

## 2021-09-15 ENCOUNTER — Telehealth: Payer: Self-pay | Admitting: Plastic Surgery

## 2021-09-15 NOTE — Telephone Encounter (Signed)
Pt is calling in need a prescription for a false bra to be sent to [facility] in [place] their number is [phone].  Pt would like to have a call back to let her know if it has been sent in. ?

## 2021-09-16 NOTE — Telephone Encounter (Signed)
Spoke to [place] and gave her both Dx code that Catalina Antigua gave in previous message. She confirmed that is all she needed.  ?

## 2021-09-16 NOTE — Telephone Encounter (Signed)
Sure thing, I'll take care of it today

## 2021-09-16 NOTE — Telephone Encounter (Signed)
[facility] is calling in needing a DX code and insurance information to get the prescription started. ?

## 2021-09-16 NOTE — Telephone Encounter (Signed)
I had spoken with a pharmacist over there who told me that we were all set with my verbal order. Should be good if we provide them with that diagnosis code. They already have her insurance information and are very familiar with her as a patient.

## 2021-09-20 NOTE — Progress Notes (Signed)
? ?Patient Care Team: ?Muse, Noel Journey., PA-C as PCP - General ?Brien Mates, RN as Oncology Nurse Navigator (Oncology) ?Mauro Kaufmann, RN as Oncology Nurse Navigator ?Rockwell Germany, RN as Oncology Nurse Navigator ?Nicholas Lose, MD as Consulting Physician (Hematology and Oncology) ?Jovita Kussmaul, MD as Consulting Physician (General Surgery) ? ?DIAGNOSIS:  ?Encounter Diagnosis  ?Name Primary?  ? Malignant neoplasm of upper-inner quadrant of left breast in female, estrogen receptor negative ([place])   ? ? ?SUMMARY OF ONCOLOGIC HISTORY: ?Oncology History  ?Malignant neoplasm of upper-inner quadrant of left breast in female, estrogen receptor negative ([place])  ?01/2020 Initial Diagnosis  ? Palpable right breast mass growing since July 2021.  Mammogram revealed 2.2 cm right breast mass with a 0.8 cm satellite lesion.  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 ?  ?03/31/2020 Cancer Staging  ? Staging form: Breast, AJCC 8th Edition ?- Clinical stage from 03/31/2020: Stage IIIB (cT2, cN1(f), cM0, G3, ER-, PR-, HER2-) - Signed by Nicholas Lose, MD on 04/02/2020 ? ?  ?04/09/2020 - 08/06/2020 Chemotherapy  ?  Patient is on Treatment Plan: BREAST AC Q21D / CARBOPLATIN D1 + PACLITAXEL D1,8,15 Q21D, Keytruda discontinued for pneumonitis and Taxol discontinued after 4 cycles because of failure to thrive ? ?  ? ?  ?12/16/2020 Surgery  ? Bilateral mastectomies   ?Left mastectomy: Intraductal papilloma, [facility] ?Right mastectomy: No residual carcinoma was 1/3 lymph nodes positive ER negative, PR +20%, HER2 2+ by IHC, positive by FISH ratio 2.03 ?  ?02/05/2021 - 05/21/2021 Chemotherapy  ? Patient is on Treatment Plan : BREAST Trastuzumab q21d  ? ?  ?  ? ? ?CHIEF COMPLIANT: breast cancer  ? ?INTERVAL HISTORY: Sharon Fischer is a 56 y.o. with above-mentioned history of  breast cancer who completed neoadjuvant chemotherapy  and bilateral mastectomies. She presents to the clinic today for a follow-up. She complains of joints and aches a pain fingers legs shoulders and wrist.  ? ? ?ALLERGIES:  has No Known Allergies. ? ?MEDICATIONS:  ?Current Outpatient Medications  ?Medication Sig Dispense Refill  ? acetaminophen (TYLENOL) 325 MG tablet Take 325 mg by mouth every 6 (six) hours as needed (FOR PAIN).    ? albuterol (VENTOLIN HFA) 108 (90 Base) MCG/ACT inhaler Inhale 1-2 puffs into the lungs every 4 (four) hours as needed for wheezing or shortness of breath. 8 g 2  ? atorvastatin (LIPITOR) 20 MG tablet Take 20 mg by mouth at bedtime.    ? Magnesium 400 MG CAPS Take 400 mg by mouth in the morning and at bedtime. 14 capsule 0  ? mometasone (ELOCON) 0.1 % ointment SMARTSIG:Topical Every 12 Hours    ? ?No current facility-administered medications for this visit.  ? ? ?PHYSICAL EXAMINATION: ?ECOG PERFORMANCE STATUS: 1 - Symptomatic but completely ambulatory ? ?Vitals:  ? 10/04/21 1348  ?BP: 110/64  ?Pulse: (!) 110  ?Resp: 18  ?Temp: (!) 97.3 ?F (36.3 ?C)  ?SpO2: 100%  ? ?Filed Weights  ? 10/04/21 1348  ?Weight: 117 lb 14.4 oz (53.5 kg)  ? ? ?BREAST: No palpable masses or nodules in either right or left breasts. No palpable axillary supraclavicular or infraclavicular adenopathy no breast tenderness or nipple discharge. (exam performed in the presence of a chaperone) ? ?LABORATORY DATA:  ?I have reviewed the data as listed ? ?  Latest Ref Rng & Units 10/04/2021  ?  1:33 PM 07/27/2021  ? 11:22 AM 07/27/2021  ? 11:14 AM  ?CMP  ?Glucose 70 - 99 mg/dL 92   77   81    ?BUN 6 - 20 mg/dL '13   4   7    ' ?Creatinine 0.44 - 1.00 mg/dL 0.74   0.60   0.63    ?Sodium 135 - 145 mmol/L 138   138   134    ?Potassium 3.5 - 5.1 mmol/L 3.6   4.0   3.8    ?Chloride 98 - 111 mmol/L 107   103   104    ?CO2 22 - 32 mmol/L 27    20    ?Calcium 8.9 - 10.3 mg/dL 9.2    9.0    ?Total Protein 6.5 - 8.1 g/dL 6.6    6.3    ?Total Bilirubin 0.3 - 1.2 mg/dL 0.3    0.6    ?Alkaline  Phos 38 - 126 U/L 140    132    ?AST 15 - 41 U/L 24    34    ?ALT 0 - 44 U/L 13    20    ? ? ?Lab Results  ?Component Value Date  ? WBC 3.5 (L) 10/04/2021  ? HGB 10.9 (L) 10/04/2021  ? HCT 32.8 (L) 10/04/2021  ? MCV 83.5 10/04/2021  ? PLT 217 10/04/2021  ? NEUTROABS 1.5 (L) 10/04/2021  ? ? ?ASSESSMENT & PLAN:  ?Malignant neoplasm of upper-inner quadrant of left breast in female, estrogen receptor negative ([place]) ?01/2020: Palpable right breast mass growing since July 2021.  Mammogram revealed 2.2 cm right breast mass with a 0.8 cm satellite lesion.  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 ?  ?Treatment plan  ?1. Neoadjuvant chemotherapy with Adriamycin and Cytoxan pembrolizumab ?4 followed by Taxol weekly ?4 with carboplatin pembrolizumab every 3 weeks x3 (chemo discontinued due to toxicities) Keytruda discontinued because of pneumonitis ?2. bilateral mastectomies 12/16/2020: Left mastectomy intraductal papilloma, right mastectomy, no residual cancer in the breast 1/3 lymph nodes positive, ER negative, PR positive, HER2 2+ by IHC, FISH positive ratio 2.03, copy #6.2 ?3. Followed by adjuvant radiation therapy ?4.  Herceptin every 3 weeks for 1 year started 02/05/2021 discontinued December 2022 ?-------------------------------------------------------------------------------------------------------------------------------------- ?Current treatment:  Surveillance ?  ?Shortness of breath: CT angiogram 03/19/2021: Negative for PE ?Echocardiogram 01/13/2021: EF 55 to 60% ?    ?Breast cancer surveillance: ?1.  Breast exam 10/04/2021: Bilateral mastectomies no palpable lumps or nodules ?2. no role of routine imaging studies since she had bilateral mastectomies. ? ?Profound weight loss: She had lost almost 60 pounds over the past 2 years.  This is because she does not eat much food and subsequently does not have any  energy to do any exercises or physical activities. ? ?Pain in the hands: We discussed the role of CBD oil and diclofenac gel to alleviate her pain. 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 ? ?Return to clinic in 1 year for follow-up ? ? ? ?No orders of the defined types were placed in this encounter. ? ?The patient has a good understanding of the overall plan. she agrees with it. she will call with any problems that may develop before the next visit here. ?Total time spent: 30 mins including face to face time and time spent for planning,  charting and co-ordination of care ? ? Harriette Ohara, MD ?10/04/21 ? ? ? I Gardiner Coins am scribing for Dr. Lindi Adie ? ?I have reviewed the above documentation for accuracy and completeness, and I agree with the above. ?. ?

## 2021-10-04 ENCOUNTER — Inpatient Hospital Stay: Payer: Medicaid Other | Admitting: Hematology and Oncology

## 2021-10-04 ENCOUNTER — Other Ambulatory Visit: Payer: Self-pay

## 2021-10-04 ENCOUNTER — Inpatient Hospital Stay: Payer: Medicaid Other | Attending: Hematology and Oncology

## 2021-10-04 DIAGNOSIS — Z923 Personal history of irradiation: Secondary | ICD-10-CM | POA: Diagnosis not present

## 2021-10-04 DIAGNOSIS — Z9013 Acquired absence of bilateral breasts and nipples: Secondary | ICD-10-CM | POA: Diagnosis not present

## 2021-10-04 DIAGNOSIS — Z171 Estrogen receptor negative status [ER-]: Secondary | ICD-10-CM

## 2021-10-04 DIAGNOSIS — Z9221 Personal history of antineoplastic chemotherapy: Secondary | ICD-10-CM | POA: Diagnosis not present

## 2021-10-04 DIAGNOSIS — C50212 Malignant neoplasm of upper-inner quadrant of left female breast: Secondary | ICD-10-CM

## 2021-10-04 LAB — CMP (CANCER CENTER ONLY)
ALT: 13 U/L (ref 0–44)
AST: 24 U/L (ref 15–41)
Albumin: 3.6 g/dL (ref 3.5–5.0)
Alkaline Phosphatase: 140 U/L — ABNORMAL HIGH (ref 38–126)
Anion gap: 4 — ABNORMAL LOW (ref 5–15)
BUN: 13 mg/dL (ref 6–20)
CO2: 27 mmol/L (ref 22–32)
Calcium: 9.2 mg/dL (ref 8.9–10.3)
Chloride: 107 mmol/L (ref 98–111)
Creatinine: 0.74 mg/dL (ref 0.44–1.00)
GFR, Estimated: >60 mL/min (ref >60)
Glucose, Bld: 92 mg/dL (ref 70–99)
Potassium: 3.6 mmol/L (ref 3.5–5.1)
Sodium: 138 mmol/L (ref 135–145)
Total Bilirubin: 0.3 mg/dL (ref 0.3–1.2)
Total Protein: 6.6 g/dL (ref 6.5–8.1)

## 2021-10-04 LAB — CBC WITH DIFFERENTIAL (CANCER CENTER ONLY)
Abs Immature Granulocytes: 0.01 10*3/uL (ref 0.00–0.07)
Basophils Absolute: 0 10*3/uL (ref 0.0–0.1)
Basophils Relative: 1 %
Eosinophils Absolute: 0.2 10*3/uL (ref 0.0–0.5)
Eosinophils Relative: 4 %
HCT: 32.8 % — ABNORMAL LOW (ref 36.0–46.0)
Hemoglobin: 10.9 g/dL — ABNORMAL LOW (ref 12.0–15.0)
Immature Granulocytes: 0 %
Lymphocytes Relative: 45 %
Lymphs Abs: 1.6 10*3/uL (ref 0.7–4.0)
MCH: 27.7 pg (ref 26.0–34.0)
MCHC: 33.2 g/dL (ref 30.0–36.0)
MCV: 83.5 fL (ref 80.0–100.0)
Monocytes Absolute: 0.3 10*3/uL (ref 0.1–1.0)
Monocytes Relative: 9 %
Neutro Abs: 1.5 10*3/uL — ABNORMAL LOW (ref 1.7–7.7)
Neutrophils Relative %: 41 %
Platelet Count: 217 10*3/uL (ref 150–400)
RBC: 3.93 MIL/uL (ref 3.87–5.11)
RDW: 12.3 % (ref 11.5–15.5)
WBC Count: 3.5 10*3/uL — ABNORMAL LOW (ref 4.0–10.5)
nRBC: 0 % (ref 0.0–0.2)

## 2021-10-04 NOTE — Assessment & Plan Note (Addendum)
01/2020:?Palpable right breast mass growing since July 2021. ?Mammogram revealed 2.2 cm right breast mass with a 0.8 cm satellite lesion. 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 ?The right breast biopsy revealed grade 3 IDC triple negative Ki-67 80%. ?? ?Treatment plan? ?1. Neoadjuvant chemotherapy with Adriamycin and Cytoxan?pembrolizumab??4 followed by Taxol weekly ?4 with carboplatin pembrolizumab?every 3 weeks x3 (chemo discontinued due to toxicities) Keytruda discontinued because of pneumonitis ?2. bilateral mastectomies 12/16/2020: Left mastectomy intraductal papilloma, right mastectomy, no residual cancer in the breast 1/3 lymph nodes positive, ER negative, PR positive, HER2 2+ by IHC, FISH positive ratio 2.03, copy #6.2 ?3. Followed by adjuvant radiation therapy ?4. ?Herceptin every 3 weeks for 1 year started 02/05/2021 discontinued December 2022 ?-------------------------------------------------------------------------------------------------------------------------------------- ?Current treatment:  Surveillance ?? ?Shortness of breath: CT angiogram 03/19/2021: Negative for PE ?Echocardiogram 01/13/2021: EF 55 to 60% ? ?? ?Breast cancer surveillance: ?1.  Breast exam 10/04/2021: Bilateral mastectomies no palpable lumps or nodules ?2. no role of routine imaging studies since she had bilateral mastectomies. ? ?Profound weight loss: She had lost almost 60 pounds over the past 2 years.  This is because she does not eat much food and subsequently does not have any energy to do any exercises or physical activities. ? ?Pain in the hands: We discussed the role of CBD oil and diclofenac gel to alleviate her pain. 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 ? ?Return to clinic in 1 year for follow-up ?

## 2021-10-05 ENCOUNTER — Telehealth: Payer: Self-pay | Admitting: Hematology and Oncology

## 2021-10-05 NOTE — Telephone Encounter (Signed)
Scheduled appointment per 4/24 los. Patient is aware. ?

## 2021-10-25 ENCOUNTER — Emergency Department (HOSPITAL_COMMUNITY)
Admission: EM | Admit: 2021-10-25 | Discharge: 2021-10-25 | Disposition: A | Discharge status: Home / Self Care | Payer: Medicaid Other | Attending: Emergency Medicine | Admitting: Emergency Medicine

## 2021-10-25 ENCOUNTER — Emergency Department (HOSPITAL_COMMUNITY): Payer: Medicaid Other

## 2021-10-25 ENCOUNTER — Encounter (HOSPITAL_COMMUNITY): Payer: Self-pay

## 2021-10-25 DIAGNOSIS — R519 Headache, unspecified: Secondary | ICD-10-CM | POA: Insufficient documentation

## 2021-10-25 DIAGNOSIS — Z853 Personal history of malignant neoplasm of breast: Secondary | ICD-10-CM | POA: Insufficient documentation

## 2021-10-25 MED ORDER — METOCLOPRAMIDE HCL 10 MG PO TABS
10.0000 mg | ORAL_TABLET | Freq: Once | ORAL | Status: DC
  Filled 2021-10-25: qty 1

## 2021-10-25 MED ORDER — METOCLOPRAMIDE HCL 10 MG PO TABS: 10.0000 mg | ORAL_TABLET | Freq: Four times a day (QID) | ORAL | 0 refills | Status: DC | PRN

## 2021-10-25 NOTE — Discharge Instructions (Addendum)
You likely have migraine headaches. ? ?Take Tylenol or Motrin for headache ? ?Please take Reglan for severe headache ? ?Please see neurology for follow up  ? ?Return to ER if you have worse headaches, vomiting, weakness, trouble speaking  ?

## 2021-10-25 NOTE — ED Notes (Signed)
Patient declined medication.  ?

## 2021-10-25 NOTE — ED Provider Notes (Signed)
?[facility] DEPT ?Provider Note ? ? ?CSN: [id number] ?Arrival date & time: 10/25/21  1327 ? ?  ? ?History ? ?Chief Complaint  ?Patient presents with  ? Headache  ?  Requesting MRI  ? ? ?Sharon Fischer is a 56 y.o. female history of breast cancer, here presenting with headache.  Patient has headache in the top of her head.  Patient was seen ED several months ago for the same thing.  She was given migraine cocktail and they recommend MRI brain to rule out mets and she wants to go home at that time.  Patient states that for the last 3 to 4 days, she has been having headache in the top of her head.  Denies any vomiting.  Patient has not seen neurology yet. ? ?The history is provided by the patient.  ? ?  ? ?Home Medications ?Prior to Admission medications   ?Medication Sig Start Date End Date Taking? Authorizing Provider  ?acetaminophen (TYLENOL) 325 MG tablet Take 325 mg by mouth every 6 (six) hours as needed (FOR PAIN).    [provider]  ?albuterol (VENTOLIN HFA) 108 (90 Base) MCG/ACT inhaler Inhale 1-2 puffs into the lungs every 4 (four) hours as needed for wheezing or shortness of breath. 08/12/20   Harle Stanford., PA-C  ?atorvastatin (LIPITOR) 20 MG tablet Take 20 mg by mouth at bedtime. 03/17/21   [provider]  ?Magnesium 400 MG CAPS Take 400 mg by mouth in the morning and at bedtime. 05/13/21   Tobb, Kardie, DO  ?mometasone (ELOCON) 0.1 % ointment SMARTSIG:Topical Every 12 Hours 03/26/21   [provider]  ?prochlorperazine (COMPAZINE) 10 MG tablet Take 1 tablet (10 mg total) by mouth every 6 (six) hours as needed (Nausea or vomiting). ?Patient not taking: Reported on 12/03/2020 10/13/20 12/24/20  Nicholas Lose, MD  ?   ? ?Allergies    ?Patient has no known allergies.   ? ?Review of Systems   ?Review of Systems  ?Neurological:  Positive for headaches.  ?All other systems reviewed and are negative. ? ?Physical Exam ?Updated Vital Signs ?BP 119/71 (BP Location:  Left Arm)   Pulse 93   Temp 98.2 ?F (36.8 ?C) (Oral)   Resp 18   SpO2 100%  ?Physical Exam ?Vitals and nursing note reviewed.  ?Constitutional:   ?   Appearance: She is well-developed.  ?HENT:  ?   Head: Normocephalic.  ?   Mouth/Throat:  ?   Mouth: Mucous membranes are moist.  ?Eyes:  ?   General: No visual field deficit. ?   Extraocular Movements: Extraocular movements intact.  ?   Pupils: Pupils are equal, round, and reactive to light.  ?Cardiovascular:  ?   Rate and Rhythm: Normal rate and regular rhythm.  ?Pulmonary:  ?   Effort: Pulmonary effort is normal.  ?   Breath sounds: Normal breath sounds.  ?Abdominal:  ?   General: Bowel sounds are normal.  ?   Palpations: Abdomen is soft.  ?Musculoskeletal:     ?   General: Normal range of motion.  ?   Cervical back: Normal range of motion and neck supple.  ?Skin: ?   General: Skin is warm.  ?Neurological:  ?   Mental Status: She is alert and oriented to person, place, and time.  ?   Cranial Nerves: No cranial nerve deficit, dysarthria or facial asymmetry.  ?   Comments: Patient has no facial droop.  No slurred speech.  Patient has normal  strength and sensation bilateral arms and legs  ? ? ?ED Results / Procedures / Treatments   ?Labs ?(all labs ordered are listed, but only abnormal results are displayed) ?Labs Reviewed - No data to display ? ?EKG ?None ? ?Radiology ?MR BRAIN WO CONTRAST ? ?Result Date: 10/25/2021 ?CLINICAL DATA:  Headache, new or worsening (Age >= 50y) history breast CA EXAM: MRI HEAD WITHOUT CONTRAST TECHNIQUE: Multiplanar, multiecho pulse sequences of the brain and surrounding structures were obtained without intravenous contrast. COMPARISON:  CT head Oct 24, 2018. FINDINGS: Brain: No acute infarction, hemorrhage, hydrocephalus, extra-axial collection or mass lesion. Vascular: Major arterial flow voids are maintained the skull base. Skull and upper cervical spine: Normal marrow signal. Sinuses/Orbits: Opacification of the left frontal sinus and  scattered ethmoid air cells. No acute orbital findings. Other: No mastoid effusions. IMPRESSION: 1. No evidence of acute intracranial abnormality. 2. Paranasal sinus disease, described above. Electronically Signed   By: Margaretha Sheffield M.D.   On: 10/25/2021 16:44   ? ?Procedures ?Procedures  ? ? ?Medications Ordered in ED ?Medications  ?metoCLOPramide (REGLAN) tablet 10 mg (has no administration in time range)  ? ? ?ED Course/ Medical Decision Making/ A&P ?  ?                        ?Medical Decision Making ?Sharon Fischer is a 55 y.o. female here presenting with headache.  This is a recurrent problem.  Patient had headaches several months ago and was in the ER and recommended MRI to rule out mets.  Patient had recurrent headache.  MRI was ordered in triage.  Patient has nonfocal neuro exam. ? ?7:34 PM ?I reviewed and independently reviewed interpreted MRI findings.  There is no stroke or obvious mets.  At this point, patient likely has migraine headaches.  Will discharge home with Reglan as needed.  Will have her follow-up with neurology. ? ? ?Problems Addressed: ?Bad headache: acute illness or injury ? ?Amount and/or Complexity of Data Reviewed ?Radiology: ordered and independent interpretation performed. Decision-making details documented in ED Course. ? ?Risk ?Prescription drug management. ? ? ?Final Clinical Impression(s) / ED Diagnoses ?Final diagnoses:  ?None  ? ? ?Rx / DC Orders ?ED Discharge Orders   ? ? None  ? ?  ? ? ?  ?Drenda Freeze, MD ?10/25/21 1935 ? ?

## 2021-10-25 NOTE — ED Notes (Signed)
Vitals attempted at this time, Pt at MRI.  ?

## 2021-10-25 NOTE — ED Notes (Signed)
Pt refused vitals at this time.

## 2021-10-25 NOTE — ED Triage Notes (Signed)
Pt c/o head pain on top. Reports its burning. Pt states she has been seen for the same but did not stay for further testing.  ? ?Hx: Cancer last chemo last year. Pt reports she is in remission.  ? ? ?A/ox4 ?Ambulatory with a cane.  ? ?Pt denies taking pain meds today for pain  ?

## 2021-11-09 ENCOUNTER — Encounter (HOSPITAL_COMMUNITY): Payer: Self-pay

## 2021-11-09 ENCOUNTER — Other Ambulatory Visit: Payer: Self-pay

## 2021-11-09 ENCOUNTER — Ambulatory Visit
Admission: EM | Admit: 2021-11-09 | Discharge: 2021-11-09 | Disposition: A | Discharge status: Nursing Home (Includes ICF) | Payer: Medicaid Other | Attending: Family Medicine | Admitting: Family Medicine

## 2021-11-09 ENCOUNTER — Encounter: Payer: Self-pay | Admitting: Emergency Medicine

## 2021-11-09 ENCOUNTER — Emergency Department (HOSPITAL_COMMUNITY)
Admission: EM | Admit: 2021-11-09 | Discharge: 2021-11-09 | Disposition: A | Discharge status: Home / Self Care | Payer: Medicaid Other | Attending: Emergency Medicine | Admitting: Emergency Medicine

## 2021-11-09 DIAGNOSIS — R42 Dizziness and giddiness: Secondary | ICD-10-CM | POA: Diagnosis not present

## 2021-11-09 DIAGNOSIS — H539 Unspecified visual disturbance: Secondary | ICD-10-CM

## 2021-11-09 DIAGNOSIS — R000 Tachycardia, unspecified: Secondary | ICD-10-CM

## 2021-11-09 DIAGNOSIS — I951 Orthostatic hypotension: Secondary | ICD-10-CM | POA: Insufficient documentation

## 2021-11-09 DIAGNOSIS — I959 Hypotension, unspecified: Secondary | ICD-10-CM | POA: Diagnosis not present

## 2021-11-09 DIAGNOSIS — H6592 Unspecified nonsuppurative otitis media, left ear: Secondary | ICD-10-CM | POA: Insufficient documentation

## 2021-11-09 LAB — CBC
HCT: 33.8 % — ABNORMAL LOW (ref 36.0–46.0)
Hemoglobin: 11.1 g/dL — ABNORMAL LOW (ref 12.0–15.0)
MCH: 27.8 pg (ref 26.0–34.0)
MCHC: 32.8 g/dL (ref 30.0–36.0)
MCV: 84.5 fL (ref 80.0–100.0)
Platelets: 187 10*3/uL (ref 150–400)
RBC: 4 MIL/uL (ref 3.87–5.11)
RDW: 13 % (ref 11.5–15.5)
WBC: 2.8 10*3/uL — ABNORMAL LOW (ref 4.0–10.5)
nRBC: 0 % (ref 0.0–0.2)

## 2021-11-09 LAB — COMPREHENSIVE METABOLIC PANEL
ALT: 13 U/L (ref 0–44)
AST: 22 U/L (ref 15–41)
Albumin: 3.4 g/dL — ABNORMAL LOW (ref 3.5–5.0)
Alkaline Phosphatase: 119 U/L (ref 38–126)
Anion gap: 4 — ABNORMAL LOW (ref 5–15)
BUN: 11 mg/dL (ref 6–20)
CO2: 24 mmol/L (ref 22–32)
Calcium: 9.2 mg/dL (ref 8.9–10.3)
Chloride: 109 mmol/L (ref 98–111)
Creatinine, Ser: 0.63 mg/dL (ref 0.44–1.00)
GFR, Estimated: >60 mL/min (ref >60)
Glucose, Bld: 96 mg/dL (ref 70–99)
Potassium: 4.3 mmol/L (ref 3.5–5.1)
Sodium: 137 mmol/L (ref 135–145)
Total Bilirubin: 0.5 mg/dL (ref 0.3–1.2)
Total Protein: 6.6 g/dL (ref 6.5–8.1)

## 2021-11-09 LAB — POCT FASTING CBG KUC MANUAL ENTRY: POCT Glucose (KUC): 106 mg/dL — AB (ref 70–99)

## 2021-11-09 NOTE — ED Triage Notes (Signed)
Patient states left ear pain and urgent care sent here over here for dizziness, and blurred vision with positive orthostatic hypotension. Patient states she has not had an appetite for a couple of days. [name] & O x4 and denies any symptoms other than left ear pain.

## 2021-11-09 NOTE — ED Provider Notes (Signed)
[facility] EMERGENCY DEPARTMENT Provider Note   CSN: [id number] Arrival date & time: 11/09/21  1107     History  Chief Complaint  Patient presents with   Hypotension   Ear Pain    Sharon Fischer is a 56 y.o. female.  HPI  This patient is a 56 year old female, she presents to the hospital today with a complaint of dizziness and actually had been seen at the urgent care earlier this morning, was found to have a middle ear effusion but because of some orthostatic vital signs was sent to the emergency department.  She has no complaint of chest pain shortness of breath or nausea or vomiting, no diarrhea, normal appetite, she reports that this morning she felt lightheaded with standing and had a feeling in her left ear with an associated feeling of being off balance.  She walks with a cane at baseline.  At the urgent care she had some tachycardia and hypotension with standing.  Patient has no other symptoms at this time and is primarily concerned about her ear  Home Medications Prior to Admission medications   Medication Sig Start Date End Date Taking? Authorizing Provider  acetaminophen (TYLENOL) 325 MG tablet Take 325 mg by mouth every 6 (six) hours as needed (FOR PAIN).   Yes [provider]  albuterol (VENTOLIN HFA) 108 (90 Base) MCG/ACT inhaler Inhale 1-2 puffs into the lungs every 4 (four) hours as needed for wheezing or shortness of breath. 08/12/20  Yes Tanner, Lyndon Code., PA-C  atorvastatin (LIPITOR) 20 MG tablet Take 20 mg by mouth at bedtime. 03/17/21  Yes [provider]  Magnesium 400 MG CAPS Take 400 mg by mouth in the morning and at bedtime. Patient not taking: Reported on 11/09/2021 05/13/21   Tobb, Godfrey Pick, DO  metoCLOPramide (REGLAN) 10 MG tablet Take 1 tablet (10 mg total) by mouth every 6 (six) hours as needed for nausea (nausea/headache). Patient not taking: Reported on 11/09/2021 10/25/21   Drenda Freeze, MD  prochlorperazine (COMPAZINE) 10 MG tablet Take 1  tablet (10 mg total) by mouth every 6 (six) hours as needed (Nausea or vomiting). Patient not taking: Reported on 12/03/2020 10/13/20 12/24/20  Nicholas Lose, MD      Allergies    Patient has no known allergies.    Review of Systems   Review of Systems  All other systems reviewed and are negative.  Physical Exam Updated Vital Signs BP 111/65   Pulse 88   Temp 98.1 F (36.7 C) (Oral)   Resp 20   Ht 1.702 m ('5\' 7"'$ )   Wt 53.1 kg   SpO2 100%   BMI 18.32 kg/m  Physical Exam Vitals and nursing note reviewed.  Constitutional:      General: She is not in acute distress.    Appearance: She is well-developed.  HENT:     Head: Normocephalic and atraumatic.     Ears:     Comments: Bilateral ear effusions left greater than right, no purulence    Nose: Nose normal.     Mouth/Throat:     Mouth: Mucous membranes are moist.     Pharynx: No oropharyngeal exudate.  Eyes:     General: No scleral icterus.       Right eye: No discharge.        Left eye: No discharge.     Conjunctiva/sclera: Conjunctivae normal.     Pupils: Pupils are equal, round, and reactive to light.  Neck:     Thyroid: No  thyromegaly.     Vascular: No JVD.  Cardiovascular:     Rate and Rhythm: Normal rate and regular rhythm.     Heart sounds: Normal heart sounds. No murmur heard.   No friction rub. No gallop.  Pulmonary:     Effort: Pulmonary effort is normal. No respiratory distress.     Breath sounds: Normal breath sounds. No wheezing or rales.  Abdominal:     General: Bowel sounds are normal. There is no distension.     Palpations: Abdomen is soft. There is no mass.     Tenderness: There is no abdominal tenderness.  Musculoskeletal:        General: No tenderness. Normal range of motion.     Cervical back: Normal range of motion and neck supple.     Right lower leg: No edema.     Left lower leg: No edema.  Lymphadenopathy:     Cervical: No cervical adenopathy.  Skin:    General: Skin is warm and dry.      Findings: No erythema or rash.  Neurological:     General: No focal deficit present.     Mental Status: She is alert.     Coordination: Coordination normal.     Comments: The patient has totally normal finger-nose-finger, normal heel-to-shin, no pronator drift, normal strength in all 4 extremities, cranial nerves III through XII are totally normal, speech is  Psychiatric:        Behavior: Behavior normal.    ED Results / Procedures / Treatments   Labs (all labs ordered are listed, but only abnormal results are displayed) Labs Reviewed  CBC - Abnormal; Notable for the following components:      Result Value   WBC 2.8 (*)    Hemoglobin 11.1 (*)    HCT 33.8 (*)    All other components within normal limits  COMPREHENSIVE METABOLIC PANEL - Abnormal; Notable for the following components:   Albumin 3.4 (*)    Anion gap 4 (*)    All other components within normal limits    EKG None  Radiology No results found.  Procedures Procedures    Medications Ordered in ED Medications - No data to display  ED Course/ Medical Decision Making/ A&P                           Medical Decision Making Amount and/or Complexity of Data Reviewed Labs: ordered. ECG/medicine tests: ordered.   Patient does not have any focal neurologic deficits, she is not having vertigo, she has not had any syncope, she has normal vital signs here in the supine position including a blood pressure of 119/68 heart rate of 93.  We will check orthostatics, basic labs since she was recently treated with chemo and radiation for breast cancer and an EKG, the patient is otherwise well-appearing with an obvious effusion of the middle ear on the left but no signs of otitis media.  Patient agreeable to the plan  This patient has improved significantly, I reviewed her labs with her including her leukopenia which seems to be chronic, her anemia which is improved and her metabolic panel which is reassuring.  I have viewed her  vital signs which are also reassuring, she is not tachycardic or hypotensive.  She appears stable for discharge at this time, the patient is agreeable and understands the results  I have discussed with the patient at the bedside the results, and the meaning of  these results.  They have expressed her understanding to the need for follow-up with primary care physician        Final Clinical Impression(s) / ED Diagnoses Final diagnoses:  Orthostatic hypotension  Middle ear effusion, left    Rx / DC Orders ED Discharge Orders     None         Noemi Chapel, MD 11/09/21 1440

## 2021-11-09 NOTE — ED Notes (Signed)
Patient is being discharged from the Urgent Care and sent to the Emergency Department via EMS . Per PA, patient is in need of higher level of care due to hypotension, vision changes. Patient is aware and verbalizes understanding of plan of care.  Vitals:   11/09/21 0958  BP: 95/63  Pulse: (!) 124  Resp: 18  Temp: 98.4 F (36.9 C)  SpO2: 97%

## 2021-11-09 NOTE — Discharge Instructions (Signed)
I would like for you to take Flonase nasal spray every morning for the next week, 2 puffs in each nostril.  I would also like for you to take medications such as Sudafed which will help to reduce the congestion and allow your ear to drain.  Drink plenty of clear liquids and return to the emergency department for severe worsening symptoms otherwise see your doctor in 1 week for recheck

## 2021-11-09 NOTE — ED Notes (Signed)
Went in to complete orthostatic vital signs and pt was sitting in chair and got up to move closer to vital sign machine. Attempted to obtain bp while in sitting position and noted to be 65 systolic x2. Pt also reports vision is becoming increasingly blurry. Pt laid flat. PA aware.   Manual bp obtained and noted to be 110/88. Saline lock placed. Pt able to contact family and they are aware of EMS transport to ED.  EMS at bedside. Pt alert and speech clear. Pt tearful.

## 2021-11-09 NOTE — ED Notes (Signed)
Report given to [facility] at [facility] ED.

## 2021-11-09 NOTE — ED Triage Notes (Signed)
Pt reports "something feels like it is coming out of my left ear." Pt also reports woke up with dizziness this am. Pt denies any other symptoms. Speech clear. Airway patent. Pt uses a cane at baseline due to neuropathy.

## 2021-11-09 NOTE — ED Provider Notes (Signed)
[facility]    CSN: [id number] Arrival date & time: 11/09/21  0948      History   Chief Complaint Chief Complaint  Patient presents with   Ear Problem    HPI Sharon Fischer is a 56 y.o. female.   Patient presenting today with several day history of abnormal sensation to the left ear, states it feels like something is coming out of the ear though there is no active drainage.  She also states that she woke up this morning feeling wobbly, off balance.  She denies any room spinning dizziness, headache, vision change, diaphoresis, nausea, vomiting, recent falls or head injury, chest pain, shortness of breath beyond her new baseline shortness of breath that is being worked up for the past few months.  She has not tried anything over-the-counter for symptoms thus far.  She has a complicated medical history to include recent breast cancer status postchemotherapy, radiation and double mastectomy currently in remission per recent radiology report, hypertension, hyperlipidemia.   Past Medical History:  Diagnosis Date   Breast cancer ([place]) 03/2020   Family history of throat cancer    High cholesterol    Hypertension     Patient Active Problem List   Diagnosis Date Noted   Dyspnea on exertion 04/27/2021   Hypertension 04/27/2021   Smoker 04/27/2021   Mixed hyperlipidemia 04/27/2021   Breast cancer ([place]) 12/16/2020   Port-A-Cath in place 05/20/2020   Family history of throat cancer    Malignant neoplasm of upper-inner quadrant of left breast in female, estrogen receptor negative ([place]) 03/31/2020    Past Surgical History:  Procedure Laterality Date   AXILLARY SENTINEL NODE BIOPSY Right 12/16/2020   Procedure: TARGETED RIGHT SENTINEL LYMPH NODE DISSECTION;  Surgeon: Jovita Kussmaul, MD;  Location: [place];  Service: General;  Laterality: Right;   BREAST CYST EXCISION Left 12/16/2020   Procedure: CYST EXCISION LEFT AXILLA;  Surgeon: Jovita Kussmaul, MD;  Location: [place];  Service:  General;  Laterality: Left;   BREAST RECONSTRUCTION WITH PLACEMENT OF TISSUE EXPANDER AND FLEX HD (ACELLULAR HYDRATED DERMIS) Bilateral 12/16/2020   Procedure: IMMEDIATE BILATERAL BREAST RECONSTRUCTION WITH PLACEMENT OF TISSUE EXPANDER AND FLEX HD (ACELLULAR HYDRATED DERMIS);  Surgeon: Wallace Going, DO;  Location: [place];  Service: Plastics;  Laterality: Bilateral;   IR IMAGING GUIDED PORT INSERTION  03/30/2020   PORT-A-CATH REMOVAL Right 12/16/2020   Procedure: REMOVAL PORT-A-CATH;  Surgeon: Jovita Kussmaul, MD;  Location: [place];  Service: General;  Laterality: Right;   REMOVAL OF BILATERAL TISSUE EXPANDERS WITH PLACEMENT OF BILATERAL BREAST IMPLANTS Bilateral 02/22/2021   Procedure: Removal of bilateral breast expanders without implant placement;  Surgeon: Wallace Going, DO;  Location: [place];  Service: Plastics;  Laterality: Bilateral;   TOTAL MASTECTOMY Bilateral 12/16/2020   Procedure: TOTAL MASTECTOMY;  Surgeon: Jovita Kussmaul, MD;  Location: [place];  Service: General;  Laterality: Bilateral;    OB History   No obstetric history on file.      Home Medications    Prior to Admission medications   Medication Sig Start Date End Date Taking? Authorizing Provider  acetaminophen (TYLENOL) 325 MG tablet Take 325 mg by mouth every 6 (six) hours as needed (FOR PAIN).    [provider]  albuterol (VENTOLIN HFA) 108 (90 Base) MCG/ACT inhaler Inhale 1-2 puffs into the lungs every 4 (four) hours as needed for wheezing or shortness of breath. 08/12/20   Harle Stanford., PA-C  atorvastatin (  LIPITOR) 20 MG tablet Take 20 mg by mouth at bedtime. 03/17/21   [provider]  Magnesium 400 MG CAPS Take 400 mg by mouth in the morning and at bedtime. 05/13/21   Tobb, Kardie, DO  metoCLOPramide (REGLAN) 10 MG tablet Take 1 tablet (10 mg total) by mouth every 6 (six) hours as needed for nausea (nausea/headache). 10/25/21   Drenda Freeze, MD  mometasone (ELOCON) 0.1 %  ointment SMARTSIG:Topical Every 12 Hours 03/26/21   [provider]  prochlorperazine (COMPAZINE) 10 MG tablet Take 1 tablet (10 mg total) by mouth every 6 (six) hours as needed (Nausea or vomiting). Patient not taking: Reported on 12/03/2020 10/13/20 12/24/20  Nicholas Lose, MD    Family History Family History  Problem Relation Age of Onset   Throat cancer Maternal Uncle        dx >50, smoker   Throat cancer Maternal Uncle        dx >50, smoker   Cancer Cousin        unknown type, dx >50 (paternal first cousin)   Cancer Cousin        unknown type (paternal first cousin)    Social History Social History   Tobacco Use   Smoking status: Former    Packs/day: 0.25    Years: 25.00    Pack years: 6.25    Types: Cigarettes   Smokeless tobacco: Never   Tobacco comments:    7-8 cigs /day  Vaping Use   Vaping Use: Never used  Substance Use Topics   Alcohol use: Never   Drug use: Never     Allergies   Patient has no known allergies.   Review of Systems Review of Systems Per HPI  Physical Exam Triage Vital Signs ED Triage Vitals  Enc Vitals Group     BP 11/09/21 0958 95/63     Pulse Rate 11/09/21 0958 (!) 124     Resp 11/09/21 0958 18     Temp 11/09/21 0958 98.4 F (36.9 C)     Temp Source 11/09/21 0958 Oral     SpO2 11/09/21 0958 97 %     Weight 11/09/21 0959 117 lb (53.1 kg)     Height 11/09/21 0959 '5\' 7"'$  (1.702 m)     Head Circumference --      Peak Flow --      Pain Score 11/09/21 0959 0     Pain Loc --      Pain Edu? --      Excl. in [place]? --    No data found.  Updated Vital Signs BP 95/63 (BP Location: Right Arm)   Pulse (!) 124   Temp 98.4 F (36.9 C) (Oral)   Resp 18   Ht '5\' 7"'$  (1.702 m)   Wt 117 lb (53.1 kg)   SpO2 97%   BMI 18.32 kg/m   Visual Acuity Right Eye Distance:   Left Eye Distance:   Bilateral Distance:    Right Eye Near:   Left Eye Near:    Bilateral Near:     Physical Exam Vitals and nursing note reviewed.   Constitutional:      Comments: Underweight, appears older than stated age.  Appears weak  HENT:     Head: Atraumatic.     Ears:     Comments: Minimal middle ear effusion bilaterally, otherwise TMs and EACs benign    Nose: Nose normal.     Mouth/Throat:     Mouth: Mucous membranes are  moist.     Pharynx: Oropharynx is clear.  Eyes:     Extraocular Movements: Extraocular movements intact.     Conjunctiva/sclera: Conjunctivae normal.  Cardiovascular:     Rate and Rhythm: Regular rhythm. Tachycardia present.  Pulmonary:     Effort: Pulmonary effort is normal.     Breath sounds: Normal breath sounds. No wheezing or rales.  Musculoskeletal:     Cervical back: Normal range of motion and neck supple.     Comments: Ambulating with a cane which patient states is her baseline  Skin:    General: Skin is warm and dry.     Findings: No erythema or rash.  Neurological:     General: No focal deficit present.     Mental Status: She is alert and oriented to person, place, and time.  Psychiatric:        Mood and Affect: Mood normal.        Thought Content: Thought content normal.        Judgment: Judgment normal.     UC Treatments / Results  Labs (all labs ordered are listed, but only abnormal results are displayed) Labs Reviewed  POCT FASTING CBG [facility] MANUAL ENTRY - Abnormal; Notable for the following components:      Result Value   POCT Glucose ([facility]) 106 (*)    All other components within normal limits    EKG   Radiology No results found.  Procedures Procedures (including critical care time)  Medications Ordered in UC Medications - No data to display  Initial Impression / Assessment and Plan / UC Course  I have reviewed the triage vital signs and the nursing notes.  Pertinent labs & imaging results that were available during my care of the patient were reviewed by me and considered in my medical decision making (see chart for details).     Patient was borderline  hypotensive and tachycardic to 124 bpm in triage, EKG performed because of the tachycardia showing sinus tachycardia at 115 bpm with nonspecific T wave changes that appear stable from previous EKG.  No ST elevations noted on EKG.  Exam was unrevealing for a cause of her dizziness, random blood sugar collected which was 106 today.  While nurse was performing orthostatic vital signs, she became weak and dizzy and states she lost vision in her right eye.  Patient was immediately laid down for safety and a manual blood pressure was taken as the machine would no longer pick up a blood pressure reading.  EMS was called at this point given the progression of her symptoms and an IV was started while awaiting EMS arrival.  Neurologic exam with no focal deficits apart from her visual complaint prior to EMS arrival.  Patient transported to the emergency department for further evaluation of her symptoms.  Final Clinical Impressions(s) / UC Diagnoses   Final diagnoses:  None   Discharge Instructions   None    ED Prescriptions   None    PDMP not reviewed this encounter.   Volney American, [place] 11/09/21 1058

## 2021-11-19 ENCOUNTER — Encounter: Payer: Medicaid Other | Admitting: Obstetrics & Gynecology

## 2021-11-25 ENCOUNTER — Encounter: Payer: Medicaid Other | Admitting: Adult Health

## 2021-12-09 ENCOUNTER — Encounter: Payer: Self-pay | Admitting: Adult Health

## 2021-12-09 ENCOUNTER — Other Ambulatory Visit (HOSPITAL_COMMUNITY)
Admission: RE | Admit: 2021-12-09 | Discharge: 2021-12-09 | Disposition: A | Discharge status: Home / Self Care | Payer: Medicaid Other | Source: Ambulatory Visit | Attending: Adult Health | Admitting: Adult Health

## 2021-12-09 ENCOUNTER — Ambulatory Visit: Payer: Medicaid Other | Admitting: Adult Health

## 2021-12-09 VITALS — BP 132/77 | HR 111 | Ht 67.0 in | Wt 115.5 lb

## 2021-12-09 DIAGNOSIS — R52 Pain, unspecified: Secondary | ICD-10-CM

## 2021-12-09 DIAGNOSIS — Z0000 Encounter for general adult medical examination without abnormal findings: Secondary | ICD-10-CM | POA: Diagnosis not present

## 2021-12-09 DIAGNOSIS — R319 Hematuria, unspecified: Secondary | ICD-10-CM | POA: Insufficient documentation

## 2021-12-09 DIAGNOSIS — Z853 Personal history of malignant neoplasm of breast: Secondary | ICD-10-CM

## 2021-12-09 DIAGNOSIS — Z124 Encounter for screening for malignant neoplasm of cervix: Secondary | ICD-10-CM | POA: Insufficient documentation

## 2021-12-09 DIAGNOSIS — N952 Postmenopausal atrophic vaginitis: Secondary | ICD-10-CM

## 2021-12-09 DIAGNOSIS — Z1211 Encounter for screening for malignant neoplasm of colon: Secondary | ICD-10-CM

## 2021-12-09 DIAGNOSIS — Z01419 Encounter for gynecological examination (general) (routine) without abnormal findings: Secondary | ICD-10-CM

## 2021-12-09 LAB — HEMOCCULT GUIAC POC 1CARD (OFFICE): Fecal Occult Blood, POC: NEGATIVE

## 2021-12-09 LAB — POCT URINALYSIS DIPSTICK
Glucose, UA: NEGATIVE
Ketones, UA: NEGATIVE
Leukocytes, UA: NEGATIVE
Nitrite, UA: NEGATIVE
Protein, UA: NEGATIVE

## 2021-12-09 NOTE — Progress Notes (Signed)
Patient ID: Sharon Fischer, female   DOB: 04/29/1966, 56 y.o.   MRN: [id number] History of Present Illness: Sharon Fischer is a 56 year old black female,single, PM in for a well woman gyn exam and pap. She says she has odor, and dark area in vagina. She has had breast cancer and had bilateral mastectomy and chemo and radiation. She says she has body aches.  She says she lost about 70 lbs  PCP is Royce Macadamia PA at [facility].  Current Medications, Allergies, Past Medical History, Past Surgical History, Family History and Social History were reviewed in [employer] record.     Review of Systems: Patient denies any headaches, hearing loss, fatigue, blurred vision, shortness of breath, chest pain, abdominal pain, problems with bowel movements, urination, or intercourse(not active). No mood swings.  See HPI for positives.    Physical Exam:BP 132/77 (BP Location: Left Arm, Patient Position: Sitting, Cuff Size: Normal)   Pulse (!) 111   Ht '5\' 7"'$  (1.702 m)   Wt 115 lb 8 oz (52.4 kg)   BMI 18.09 kg/m  urine dipstick 1+blood  General:  she is thin and looks uncomfortable Skin:  Warm and dry Neck:  Midline trachea, normal thyroid, good ROM, no lymphadenopathy Lungs; Clear to auscultation bilaterally Breast: Absent,skin color changes from radiation, no masses felt on chest Cardiovascular: Regular rate and rhythm Abdomen:  Soft, non tender, no hepatosplenomegaly Pelvic:  External genitalia is normal in appearance, no lesions.  The vagina is pale with loss of moisture and rugae.Urethra has no lesions or masses. The cervix is smooth, pap with HR HPV genotyping performed.  Uterus is felt to be normal size, shape, and contour.  No adnexal masses or tenderness noted.Bladder is non tender, no masses felt. Rectal: Good sphincter tone, no polyps, or hemorrhoids felt.  Hemoccult negative. Extremities/musculoskeletal:  No swelling or varicosities noted, no clubbing or cyanosis Psych:  No mood  changes, alert and cooperative,seems happy AA is 5 Fall risk is low, but she is using cane    12/09/2021   10:36 AM  Depression screen PHQ 2/9  Decreased Interest 0  Down, Depressed, Hopeless 0  PHQ - 2 Score 0  Altered sleeping 0  Tired, decreased energy 2  Change in appetite 2  Feeling bad or failure about yourself  0  Trouble concentrating 2  Moving slowly or fidgety/restless 0  Suicidal thoughts 0  PHQ-9 Score 6       12/09/2021   10:36 AM  GAD 7 : Generalized Anxiety Score  Nervous, Anxious, on Edge 0  Control/stop worrying 0  Worry too much - different things 0  Trouble relaxing 0  Restless 0  Easily annoyed or irritable 0  Afraid - awful might happen 0  Total GAD 7 Score 0      Upstream - 12/09/21 1051       Pregnancy Intention Screening   Does the patient want to become pregnant in the next year? N/A    Does the patient's partner want to become pregnant in the next year? N/A    Would the patient like to discuss contraceptive options today? N/A      Contraception Wrap Up   Current Method No Method - Other Reason;Abstinence   postmenopausal   End Method Abstinence;No Method - Other Reason   postmenopausal           Examination chaperoned by Levy Pupa LPN  Impression and Plan: 1. Hematuria, unspecified type UA C&S sent  -  POCT Urinalysis Dipstick - Urine Culture - Urinalysis, Routine w reflex microscopic  2. Routine cervical smear Pap sent  - Cytology - PAP( [place])  3. Encounter for screening fecal occult blood testing Hemoccult negative  - POCT occult blood stool  4. Generalized body aches Started after chemo Follow up with oncology  5. History of breast cancer  6. Encounter for gynecological examination with Papanicolaou smear of cervix Pap sent  Physical with PCP  Pap in 3 years if normal  She declines colonoscopy at present or cologuard   7. Vaginal atrophy

## 2021-12-10 LAB — URINALYSIS, ROUTINE W REFLEX MICROSCOPIC
Bilirubin, UA: NEGATIVE
Glucose, UA: NEGATIVE
Ketones, UA: NEGATIVE
Nitrite, UA: NEGATIVE
Protein,UA: NEGATIVE
Specific Gravity, UA: 1.021 (ref 1.005–1.030)
Urobilinogen, Ur: 1 mg/dL (ref 0.2–1.0)
pH, UA: 5.5 (ref 5.0–7.5)

## 2021-12-10 LAB — MICROSCOPIC EXAMINATION
Bacteria, UA: NONE SEEN
Casts: NONE SEEN /lpf

## 2021-12-11 LAB — URINE CULTURE: Organism ID, Bacteria: NO GROWTH

## 2021-12-13 LAB — CYTOLOGY - PAP
Chlamydia: NEGATIVE
Comment: NEGATIVE
Comment: NEGATIVE
Comment: NORMAL
Diagnosis: NEGATIVE
High risk HPV: NEGATIVE
Neisseria Gonorrhea: NEGATIVE

## 2021-12-15 ENCOUNTER — Telehealth: Payer: Self-pay | Admitting: *Deleted

## 2021-12-15 NOTE — Telephone Encounter (Signed)
Pt aware no growth on urine and pap was negative for malignancy, HPV and GC/CHL. Pt voiced understanding. [place]

## 2021-12-15 NOTE — Telephone Encounter (Signed)
-----   Message from Estill Dooms, NP sent at 12/15/2021 10:34 AM EDT ----- Let her know no growth on urine and pap was negative for malignancy  and HPV,GC/chlamydia THX

## 2021-12-23 ENCOUNTER — Other Ambulatory Visit: Payer: Self-pay

## 2021-12-23 ENCOUNTER — Emergency Department (HOSPITAL_COMMUNITY)
Admission: EM | Admit: 2021-12-23 | Discharge: 2021-12-23 | Disposition: A | Discharge status: Home / Self Care | Payer: Medicaid Other | Attending: Emergency Medicine | Admitting: Emergency Medicine

## 2021-12-23 ENCOUNTER — Emergency Department (HOSPITAL_COMMUNITY): Payer: Medicaid Other

## 2021-12-23 ENCOUNTER — Encounter (HOSPITAL_COMMUNITY): Payer: Self-pay | Admitting: *Deleted

## 2021-12-23 DIAGNOSIS — I10 Essential (primary) hypertension: Secondary | ICD-10-CM | POA: Insufficient documentation

## 2021-12-23 DIAGNOSIS — Z853 Personal history of malignant neoplasm of breast: Secondary | ICD-10-CM | POA: Insufficient documentation

## 2021-12-23 DIAGNOSIS — H748X3 Other specified disorders of middle ear and mastoid, bilateral: Secondary | ICD-10-CM | POA: Insufficient documentation

## 2021-12-23 DIAGNOSIS — D649 Anemia, unspecified: Secondary | ICD-10-CM | POA: Diagnosis not present

## 2021-12-23 DIAGNOSIS — H6593 Unspecified nonsuppurative otitis media, bilateral: Secondary | ICD-10-CM

## 2021-12-23 DIAGNOSIS — F419 Anxiety disorder, unspecified: Secondary | ICD-10-CM | POA: Insufficient documentation

## 2021-12-23 DIAGNOSIS — R0602 Shortness of breath: Secondary | ICD-10-CM | POA: Diagnosis not present

## 2021-12-23 DIAGNOSIS — H9203 Otalgia, bilateral: Secondary | ICD-10-CM | POA: Diagnosis present

## 2021-12-23 LAB — BASIC METABOLIC PANEL
Anion gap: 2 — ABNORMAL LOW (ref 5–15)
BUN: 8 mg/dL (ref 6–20)
CO2: 24 mmol/L (ref 22–32)
Calcium: 8.5 mg/dL — ABNORMAL LOW (ref 8.9–10.3)
Chloride: 107 mmol/L (ref 98–111)
Creatinine, Ser: 0.65 mg/dL (ref 0.44–1.00)
GFR, Estimated: >60 mL/min (ref >60)
Glucose, Bld: 76 mg/dL (ref 70–99)
Potassium: 3.6 mmol/L (ref 3.5–5.1)
Sodium: 133 mmol/L — ABNORMAL LOW (ref 135–145)

## 2021-12-23 LAB — CBC
HCT: 32.3 % — ABNORMAL LOW (ref 36.0–46.0)
Hemoglobin: 10.6 g/dL — ABNORMAL LOW (ref 12.0–15.0)
MCH: 27.7 pg (ref 26.0–34.0)
MCHC: 32.8 g/dL (ref 30.0–36.0)
MCV: 84.6 fL (ref 80.0–100.0)
Platelets: 187 10*3/uL (ref 150–400)
RBC: 3.82 MIL/uL — ABNORMAL LOW (ref 3.87–5.11)
RDW: 12.6 % (ref 11.5–15.5)
WBC: 3.3 10*3/uL — ABNORMAL LOW (ref 4.0–10.5)
nRBC: 0 % (ref 0.0–0.2)

## 2021-12-23 LAB — BRAIN NATRIURETIC PEPTIDE: B Natriuretic Peptide: 125 pg/mL — ABNORMAL HIGH (ref 0.0–100.0)

## 2021-12-23 MED ORDER — CETIRIZINE HCL 10 MG PO TABS: 10.0000 mg | ORAL_TABLET | Freq: Every day | ORAL | 0 refills | Status: DC

## 2021-12-23 NOTE — ED Provider Notes (Signed)
With [facility] Provider Note   CSN: [id number] Arrival date & time: 12/23/21  1093     History  Chief Complaint  Patient presents with   Otalgia   Shortness of Breath    Sharon Fischer is a 56 y.o. female with Hx of hyperlipidemia, prior breast cancer with bilateral mastectomy, HTN, and family history of throat cancer.  Presents today with continued ear pressure of both ears since her last ED visit 5/30.  Has tried Sudafed and Flonase with no relief.  Denies congestion, postnasal drip, ear discharge, ear pain, fevers, chills, N/V/D, headache, or cough.  A few hours ago, also began feeling mildly short of breath when she woke up.  Then felt as if her throat felt tight, but denies throat pain.  States she felt anxious and believes she was having a panic attack, stating she gets these somewhat often.  Called her daughter and wanted to go to the ED.  When she got down to the car, states that shortness of breath and throat tightness had both resolved on their own.  Has not felt short of breath since.  Denied any recent or active chest pain, palpitations, dizziness, lightheadedness, syncope, or weakness.  Denied mouth or tongue swelling, difficulty swallowing, or sore throat.  No Hx of asthma or COPD.  Known Hx of dyspnea on exertion.  States when this happens, usually breathes better lying down than sitting up right.  The history is provided by the patient and medical records.  Sore Throat Associated symptoms include shortness of breath.       Home Medications Prior to Admission medications   Medication Sig Start Date End Date Taking? Authorizing Provider  cetirizine (ZYRTEC ALLERGY) 10 MG tablet Take 1 tablet (10 mg total) by mouth daily. 12/23/21  Yes Prince Rome, PA-C  Ibuprofen (ADVIL PO) Take by mouth.   Yes [provider]  acetaminophen (TYLENOL) 325 MG tablet Take 325 mg by mouth every 6 (six) hours as needed (FOR PAIN). Patient not taking:  Reported on 12/23/2021    [provider]  albuterol (VENTOLIN HFA) 108 (90 Base) MCG/ACT inhaler Inhale 1-2 puffs into the lungs every 4 (four) hours as needed for wheezing or shortness of breath. Patient not taking: Reported on 12/23/2021 08/12/20   Harle Stanford., PA-C  atorvastatin (LIPITOR) 20 MG tablet Take 20 mg by mouth at bedtime. Patient not taking: Reported on 12/23/2021 03/17/21   [provider]  prochlorperazine (COMPAZINE) 10 MG tablet Take 1 tablet (10 mg total) by mouth every 6 (six) hours as needed (Nausea or vomiting). Patient not taking: Reported on 12/03/2020 10/13/20 12/24/20  Nicholas Lose, MD      Allergies    Patient has no known allergies.    Review of Systems   Review of Systems  HENT:  Positive for ear pain (Pressure).   Respiratory:  Positive for shortness of breath.   Psychiatric/Behavioral:  The patient is nervous/anxious.     Physical Exam Updated Vital Signs BP 114/61   Pulse 84   Temp 98.3 F (36.8 C) (Oral)   Resp 17   Ht '5\' 7"'$  (1.702 m)   Wt 52.4 kg   SpO2 100%   BMI 18.09 kg/m  Physical Exam Vitals and nursing note reviewed.  Constitutional:      General: She is not in acute distress.    Appearance: She is well-developed and underweight. She is not ill-appearing, toxic-appearing or diaphoretic.  HENT:  Head: Normocephalic and atraumatic.     Right Ear: Ear canal and external ear normal. No tenderness. A middle ear effusion is present. Tympanic membrane is not erythematous or bulging.     Left Ear: Ear canal and external ear normal. No tenderness. A middle ear effusion is present. Tympanic membrane is not erythematous or bulging.     Nose: No congestion or rhinorrhea.     Mouth/Throat:     Mouth: Mucous membranes are moist.     Pharynx: Oropharynx is clear. Uvula midline. No pharyngeal swelling, oropharyngeal exudate, posterior oropharyngeal erythema or uvula swelling.     Tonsils: No tonsillar exudate or tonsillar abscesses.   Eyes:     General: Lids are normal. Gaze aligned appropriately.     Conjunctiva/sclera: Conjunctivae normal.     Right eye: Right conjunctiva is not injected.     Left eye: Left conjunctiva is not injected.  Neck:     Thyroid: No thyroid tenderness.     Comments: Neck very supple on exam Cardiovascular:     Rate and Rhythm: Normal rate and regular rhythm.     Pulses:          Radial pulses are 2+ on the right side and 2+ on the left side.     Heart sounds: Normal heart sounds. No murmur heard.    Comments: RRR without M/R/G Pulmonary:     Effort: Pulmonary effort is normal. No accessory muscle usage, prolonged expiration or respiratory distress.     Breath sounds: Normal breath sounds. No stridor or decreased air movement. No wheezing, rhonchi or rales.     Comments: CTAB, able to communicate without difficulty, without increased respiratory effort. Chest:     Chest wall: No tenderness.  Abdominal:     Palpations: Abdomen is soft.     Tenderness: There is no abdominal tenderness. There is no guarding.  Musculoskeletal:        General: No swelling.     Cervical back: Neck supple. No rigidity or crepitus.  Lymphadenopathy:     Cervical: No cervical adenopathy.  Skin:    General: Skin is warm and dry.     Capillary Refill: Capillary refill takes less than 2 seconds.  Neurological:     Mental Status: She is alert and oriented to person, place, and time. Mental status is at baseline.     GCS: GCS eye subscore is 4. GCS verbal subscore is 5. GCS motor subscore is 6.     Cranial Nerves: No dysarthria or facial asymmetry.     Sensory: Sensation is intact.     Motor: No tremor or seizure activity.     Coordination: Coordination normal.     Gait: Gait is intact.  Psychiatric:        Mood and Affect: Mood normal.     ED Results / Procedures / Treatments   Labs (all labs ordered are listed, but only abnormal results are displayed) Labs Reviewed  BASIC METABOLIC PANEL - Abnormal;  Notable for the following components:      Result Value   Sodium 133 (*)    Calcium 8.5 (*)    Anion gap 2 (*)    All other components within normal limits  BRAIN NATRIURETIC PEPTIDE - Abnormal; Notable for the following components:   B Natriuretic Peptide 125.0 (*)    All other components within normal limits  CBC - Abnormal; Notable for the following components:   WBC 3.3 (*)    RBC 3.82 (*)  Hemoglobin 10.6 (*)    HCT 32.3 (*)    All other components within normal limits    EKG EKG Interpretation  Date/Time:  Thursday December 23 2021 12:07:23 EDT Ventricular Rate:  89 PR Interval:  177 QRS Duration: 90 QT Interval:  451 QTC Calculation: 549 R Axis:   82 Text Interpretation: Sinus rhythm Minimal ST elevation, inferior leads Prolonged QT interval new QT prolongation from prior 5/23 Confirmed by Aletta Edouard [phone]) on 12/23/2021 12:26:48 PM  Radiology DG Chest Port 1 View  Result Date: 12/23/2021 CLINICAL DATA:  56 year old female with history of shortness of breath. EXAM: PORTABLE CHEST 1 VIEW COMPARISON:  Chest x-ray 07/27/2021. FINDINGS: Lung volumes are increased with mild emphysematous changes. No consolidative airspace disease. No pleural effusions. No pneumothorax. No pulmonary nodule or mass noted. Pulmonary vasculature and the cardiomediastinal silhouette are within normal limits. Atherosclerosis in the thoracic aorta. Surgical clips along the chest wall bilaterally. IMPRESSION: 1. No radiographic evidence of acute cardiopulmonary disease. 2. Emphysema. 3. Aortic atherosclerosis. Electronically Signed   By: Vinnie Langton M.D.   On: 12/23/2021 11:24    Procedures Procedures    Medications Ordered in ED Medications - No data to display  ED Course/ Medical Decision Making/ A&P                           Medical Decision Making Amount and/or Complexity of Data Reviewed Labs: ordered. Radiology: ordered.  Risk OTC drugs.   56 y.o. female presents to the ED  for concern of Otalgia and Shortness of Breath   This involves an extensive number of treatment options, and is a complaint that carries with it a high risk of complications and morbidity.  The emergent differential diagnosis prior to evaluation includes, but is not limited to: Viral pharyngitis, strep pharyngitis, RPA, PTA, GERD  This is not an exhaustive differential.   Past Medical History / Co-morbidities / Social History: Hx of mixed hyperlipidemia, prior breast cancer S/P bilateral mastectomies, HTN, tobacco use, and family history of throat cancer. Social Determinants of Health include: None  Additional History:  Internal and external records from outside source obtained and reviewed including ED visits  Lab Tests: I ordered, and personally interpreted labs.  The pertinent results include:   CBC: Mild anemia of Hgb 10.6 and HCT 32.3, which appears to consistently wax and wane over the last few months CMP/BMP: Sodium 133 BNP: 125, nonspecific  Imaging Studies: I ordered imaging studies including CXR .   I independently visualized and interpreted imaging which showed no acute cardiopulmonary pathology, but does indicate emphysema and aortic atherosclerosis I agree with the radiologist interpretation. I personally ordered and interpreted EKG 12 lead findings, which showed: NSR with new QT prolongation from prior 5/23  ED Course: Pt anxious but well-appearing on exam.  Nonseptic, not ill-appearing, AAOx4.  In NAD.  Sitting comfortably.  Accompanied by daughter.  With continued bilateral ear pressure since ED visit 6 weeks ago.  Tried Sudafed and Flonase without relief.  No drainage from the ear canals or tympanic membranes.  Ear exam suggestive of middle ear effusion, without evidence of otitis media or otitis externa.  Exam findings not consistent with malignant otitis externa or mastoiditis.  Low suspicion for viral pharyngitis or strep pharyngitis based on history and physical exam  findings.  Low suspicion for RPA or PTA.  No appreciated cervical lymphadenopathy. Transient shortness of breath that relieved on its own likely due to  anxiety.  Patient with history of panic attacks and states this feels exactly the same.  No known Hx of lung disease such as asthma or COPD though today's CXR does indicate evidence of emphysema.  Patient without any respiratory difficulty on exam or since arrival.  Satting at 100% on room air.  ABCs intact.  CTAB.  No evidence of angioedema, dysphagia, cough, respiratory distress, neck stiffness.  Low suspicion for meningitis, no meningismus.  CXR negative for pneumonia or pneumothorax.   BNP elevated at 125 which is nonspecific, low suspicion for constrictive pericarditis or sepsis.  EKG with mild prolonged QT interval since last tracing in May.  Very mild nonspecific ST changes, but with clinical picture and history and BMI of 18, not clinically suggestive of myocardial infarction.  Clinical picture not suggestive of fluid overload or acute CHF.  Without dizziness or significant ear pain.  Low suspicion for BPPV.  Gait intact.  Overall coordination, strength, and sensation grossly intact as well.  No evidence of obvious or focal neurodeficits. Recommend close follow-up with ENT and continue conservative treatment management.  Added on Zyrtec for additional decongestive benefits.  Overall I am unsure of the underlying etiology of the middle ear effusions.  Effusion does not seem infectious in nature, therefore I do not believe antibiotics are indicated at this time.  However, I do not believe the patient is experiencing a medical, surgical, or psychological emergency at this time.  Patient verbally authorized discussion of medical information with daughter and 2 other family members who were present upon reevaluation.  Patient and her family members in agreement, patient expresses she wishes to be discharged, feels overall better, would like to go home and rest.   Patient in NAD and in good condition at time of discharge.  Disposition: After consideration the patient's encounter today, I do not feel today's workup suggests an emergent condition requiring admission or immediate intervention beyond what has been performed at this time.  Safe for discharge; instructed to return immediately for worsening symptoms, change in symptoms or any other concerns.  I have reviewed the patients home medicines and have made adjustments as needed.  Discussed course of treatment with the patient, whom demonstrated understanding.  Patient in agreement and has no further questions.    I discussed this case with my attending physician Dr. Melina Copa, who agreed with the proposed treatment course and cosigned this note including patient's presenting symptoms, physical exam, and planned diagnostics and interventions.  Attending physician stated agreement with plan or made changes to plan which were implemented.     This chart was dictated using voice recognition software.  Despite best efforts to proofread, errors can occur which can change the documentation meaning.         Final Clinical Impression(s) / ED Diagnoses Final diagnoses:  Middle ear effusion, bilateral  Anxiousness    Rx / DC Orders ED Discharge Orders          Ordered    cetirizine (ZYRTEC ALLERGY) 10 MG tablet  Daily        12/23/21 1255              Prince Rome, [place] 20/10/07 0112    Hayden Rasmussen, MD 12/24/21 1918

## 2021-12-23 NOTE — ED Notes (Signed)
Pt ambulated without any assistance and O2 states went to 91% and returned to 100% walking back to the room.

## 2021-12-23 NOTE — ED Notes (Signed)
Pt stated that she came in for her ear pain and trouble breathing and not a sore throat.

## 2021-12-23 NOTE — ED Triage Notes (Signed)
Pt c/o sore throat and bilateral ear pain  Pt has taken sudafed and flonase with no relief

## 2021-12-23 NOTE — Discharge Instructions (Addendum)
Follow-up with your PCP within the next 2 to 3 days for reevaluation continue medical management.  You have also been provided the contact information for an ENT specialist by the name of Dr. Janace Hoard, please call to schedule a follow-up appointment within the next week or so for reevaluation and continued work-up.  Continue taking the Flonase, in addition to Claritin.  If you are out of Claritin or would like to try a different decongestant, you may take zyrtec instead.  I have sent zyrtec to your pharmacy.  Return to the ED for new or worsening symptoms as discussed

## 2022-02-22 IMAGING — MR MR BREAST BX W/ LOC DEV 1ST LEASION IMAGE BX SPEC MR GUIDE*R*
6 of 8 series · 33 of 48 positions shown · IV contrast (10 ml Gadavist)
Comparison: Previous exams.
COMPARISON: Previous exams.

Addendum:
CLINICAL DATA: 54-year-old female with recently diagnosed right
breast cancer presenting for additional bilateral breast biopsies.

EXAM:
MRI GUIDED CORE NEEDLE BIOPSY OF THE RIGHT BREAST
MRI GUIDED CORE NEEDLE BIOPSY OF THE LEFT BREAST
TECHNIQUE: Multiplanar, multisequence MR imaging of the bilateral breast was
performed both before and after administration of intravenous
contrast.
CONTRAST:  10mL GADAVIST GADOBUTROL 1 MMOL/ML IV SOLN

[Series 2: fiducial bilateral · sagittal · 2.0mm · 1.33mm/px · 6 of 160 slices shown]
[im 1/160]
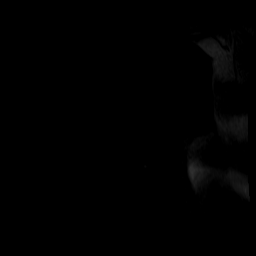
[im 32/160]
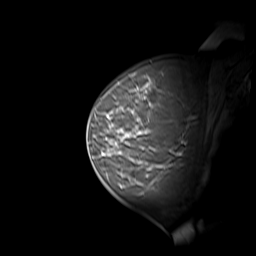
[im 64/160]
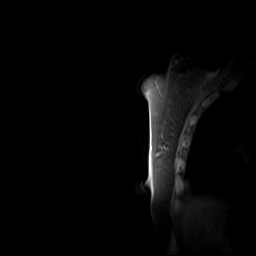
[im 96/160]
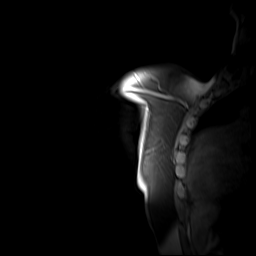
[im 128/160]
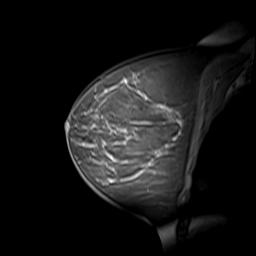
[im 160/160]
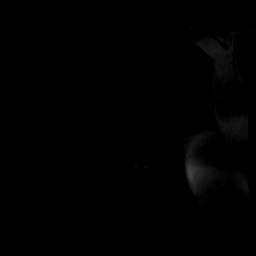

[Series 3: dynamic pre · axial · non-contrast · 1.3mm · 0.78mm/px · z∈[-107,+141]mm · 6 of 192 slices shown]
[im 1/192]
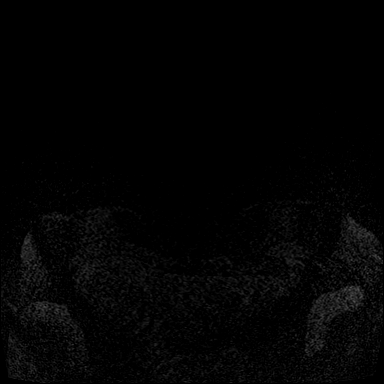
[im 39/192]
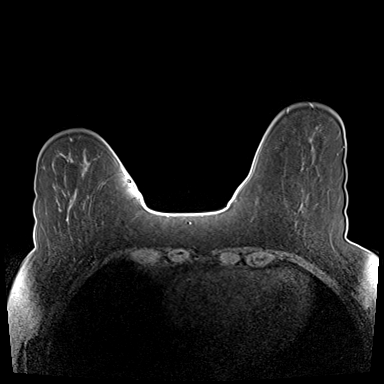
[im 77/192]
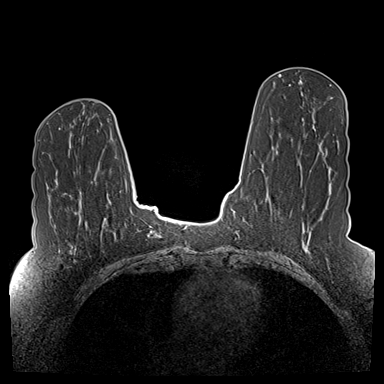
[im 115/192]
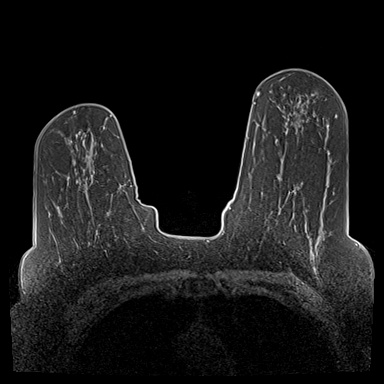
[im 153/192]
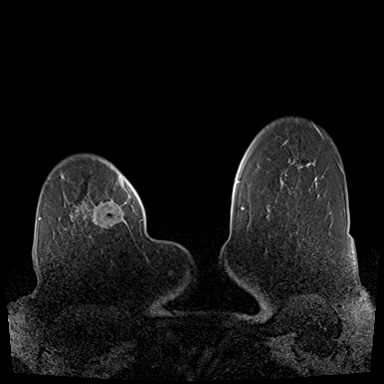
[im 192/192]
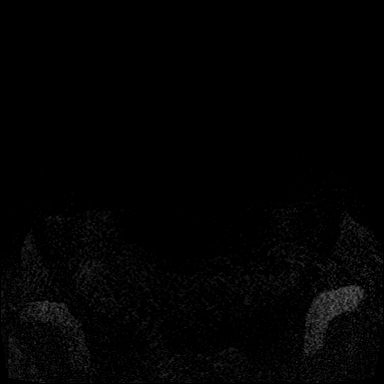

[Series 4: dynamic post 20 · axial · 1.3mm · 0.78mm/px · z∈[-107,+141]mm · 6 of 192 slices shown (1 of 2)]
[im 1/192]
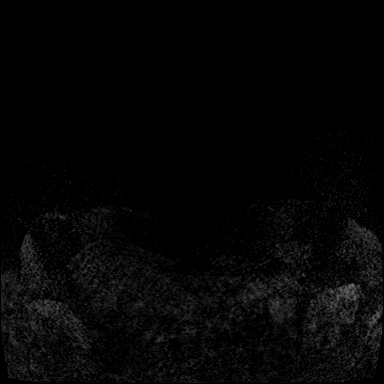
[im 39/192]
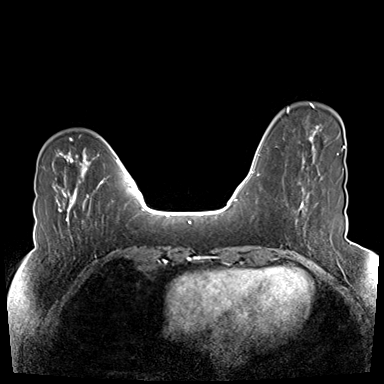
[im 77/192]
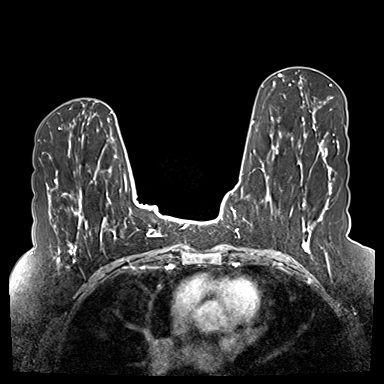
[im 115/192]
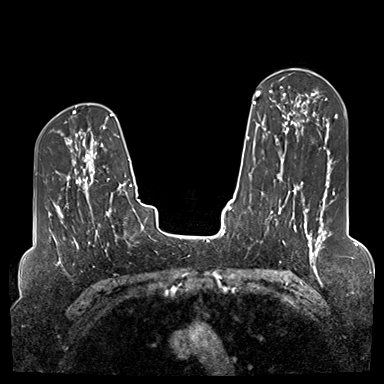
[im 153/192]
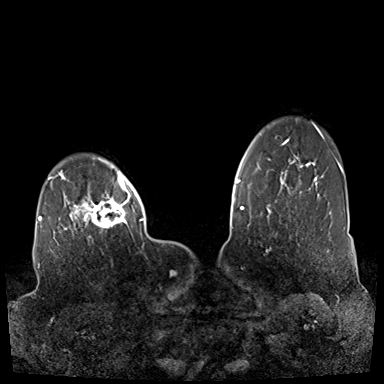
[im 192/192]
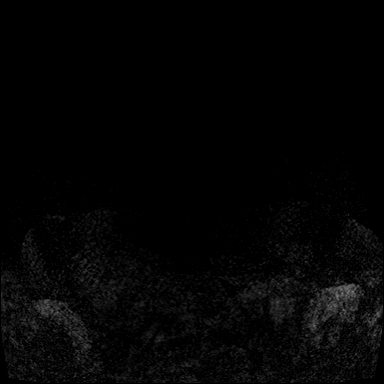

[Series 5: dynamic post 20 · axial · 1.3mm · 0.78mm/px · z∈[-107,+141]mm · 6 of 192 slices shown (2 of 2)]
[im 1/192]
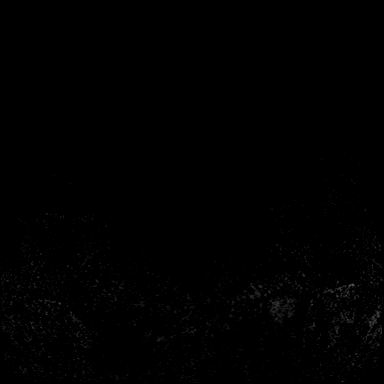
[im 39/192]
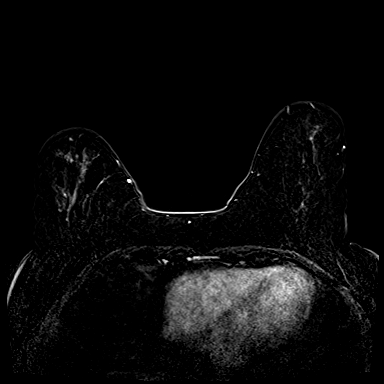
[im 77/192]
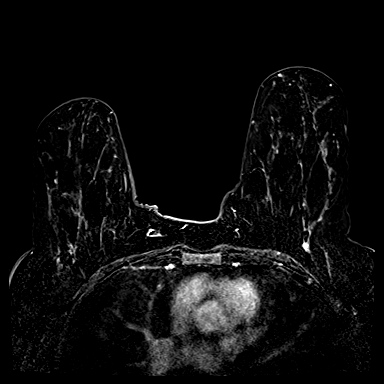
[im 115/192]
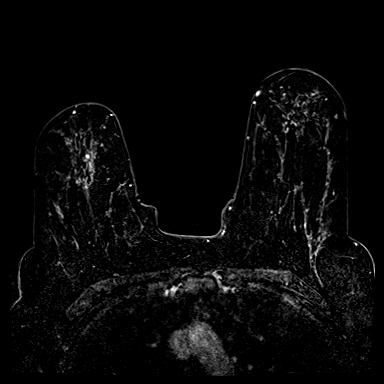
[im 153/192]
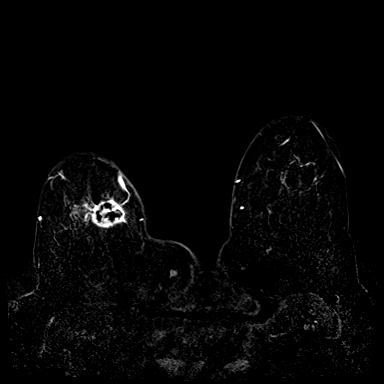
[im 192/192]
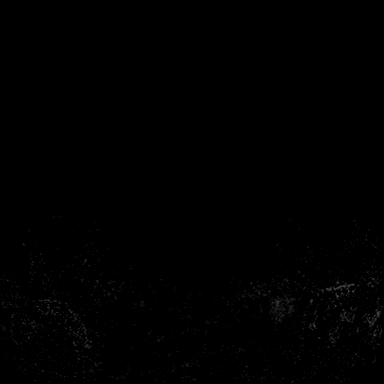

[Series 6: dynamic post 3 · axial · 1.3mm · 0.78mm/px · z∈[-107,+141]mm · 6 of 192 slices shown (1 of 2)]
[im 1/192]
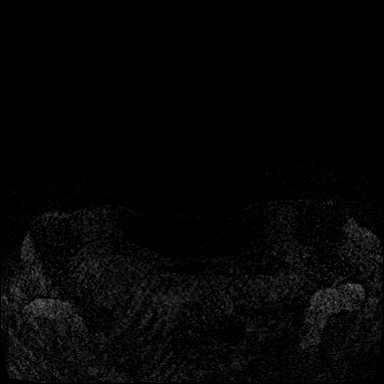
[im 39/192]
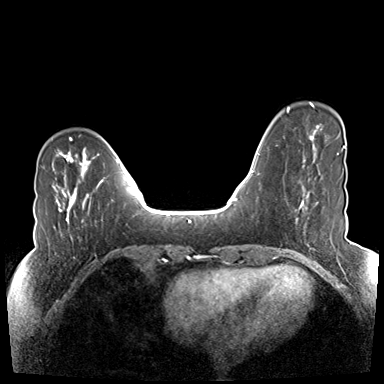
[im 77/192]
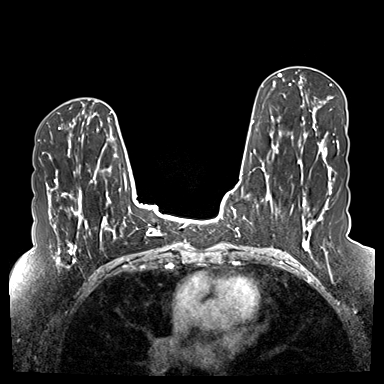
[im 115/192]
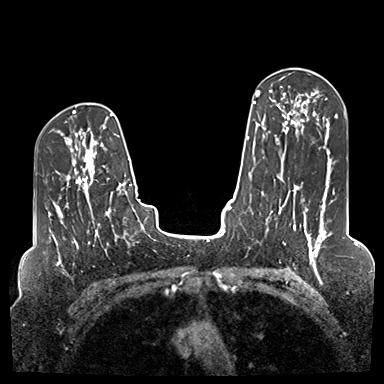
[im 153/192]
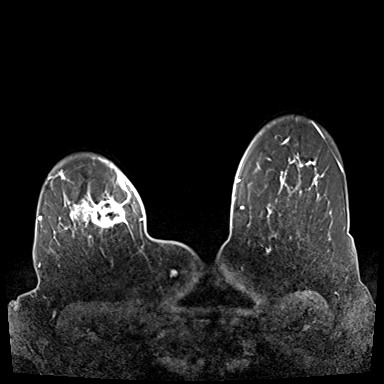
[im 192/192]
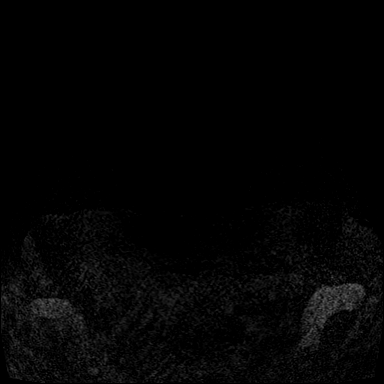

[Series 7: dynamic post 3 · axial · 1.3mm · 0.78mm/px · z∈[-107,-8]mm · 3 of 192 slices shown (2 of 2)]
[im 1/192]
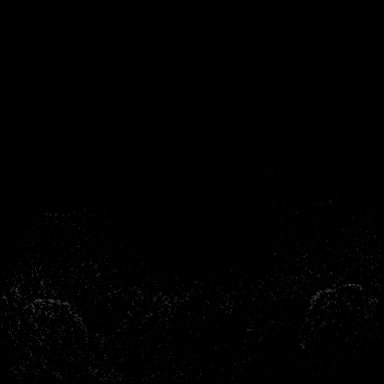
[im 39/192]
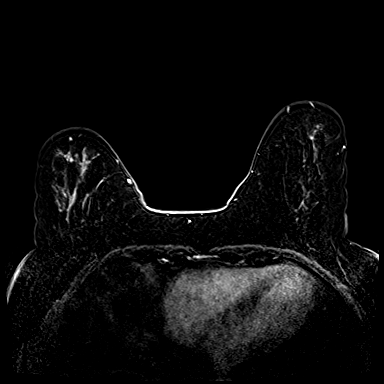
[im 77/192]
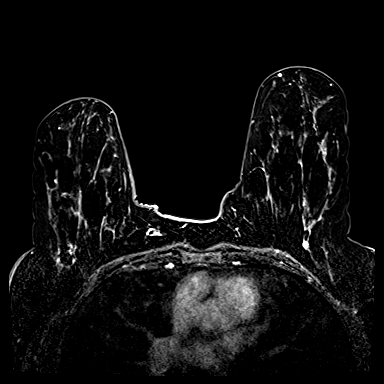

[33 of 48 positions shown; findings below may reference images not displayed]

FINDINGS: I met with the patient, and we discussed the procedure of MRI guided
biopsy, including risks, benefits, and alternatives. Specifically,
we discussed the risks of infection, bleeding, tissue injury, clip
migration, and inadequate sampling. Informed, written consent was
given. The usual time out protocol was performed immediately prior
to the procedure.

Using sterile technique, 1% Lidocaine, MRI guidance, and a 9 gauge
vacuum assisted device, biopsy was performed of a mass in the
superior central right breast using a lateral approach. At the
conclusion of the procedure, a a barbell tissue marker clip was
deployed into the biopsy cavity. Follow-up 2-view mammogram was
performed and dictated separately.

Using sterile technique, 1% Lidocaine, MRI guidance, and a 9 gauge
vacuum assisted device, biopsy was performed of of a mass in the
superior central left breast using a lateral approach. At the
conclusion of the procedure, a a barbell tissue marker clip was
deployed into the biopsy cavity. Follow-up 2-view mammogram was
performed and dictated separately.
IMPRESSION: MRI guided biopsy of a mass in the superior central RIGHT breast and
of a mass in the superior central LEFT breast. No apparent
complications.

ADDENDUM:
Pathology revealed INTRADUCTAL PAPILLOMA AND ADENOSIS of the RIGHT
breast, superior central. This was found to be concordant by Dr.
Hirmano Azali, with excision recommended.

Pathology revealed COMPLEX SCLEROSING LESION WITH INTRADUCTAL
PAPILLOMAS of the LEFT breast, superior central. This was found to
be concordant by Dr. Hirmano Azali, with excision recommended.

Pathology results were discussed with the patient by telephone. The
patient reported doing well after the biopsies with tenderness at
the sites. Post biopsy instructions and care were reviewed and
questions were answered. The patient was encouraged to call The

The patient has a recent diagnosis of right breast cancer and should
follow her outlined surgical/treatment plan. Results called to
[REDACTED] ([place] with Dr. Tryfwnas Foskolos).

Pathology results reported by Yariel Oyler RN on 04/16/2020.

*** End of Addendum ***
FINDINGS: I met with the patient, and we discussed the procedure of MRI guided
biopsy, including risks, benefits, and alternatives. Specifically,
we discussed the risks of infection, bleeding, tissue injury, clip
migration, and inadequate sampling. Informed, written consent was
given. The usual time out protocol was performed immediately prior
to the procedure.

Using sterile technique, 1% Lidocaine, MRI guidance, and a 9 gauge
vacuum assisted device, biopsy was performed of a mass in the
superior central right breast using a lateral approach. At the
conclusion of the procedure, a a barbell tissue marker clip was
deployed into the biopsy cavity. Follow-up 2-view mammogram was
performed and dictated separately.

Using sterile technique, 1% Lidocaine, MRI guidance, and a 9 gauge
vacuum assisted device, biopsy was performed of of a mass in the
superior central left breast using a lateral approach. At the
conclusion of the procedure, a a barbell tissue marker clip was
deployed into the biopsy cavity. Follow-up 2-view mammogram was
performed and dictated separately.
IMPRESSION: MRI guided biopsy of a mass in the superior central RIGHT breast and
of a mass in the superior central LEFT breast. No apparent
complications.

## 2022-02-22 IMAGING — MG MM BREAST LOCALIZATION CLIP
4 series · 4 of 12 positions shown · non-contrast
Comparison: Previous exam(s).

CLINICAL DATA: Post procedure mammogram for clip placement.

EXAM:
DIAGNOSTIC RIGHT MAMMOGRAM POST MRI BIOPSY
DIAGNOSTIC LEFT MAMMOGRAM POST MRI BIOPSY

[L ML synth-2D]
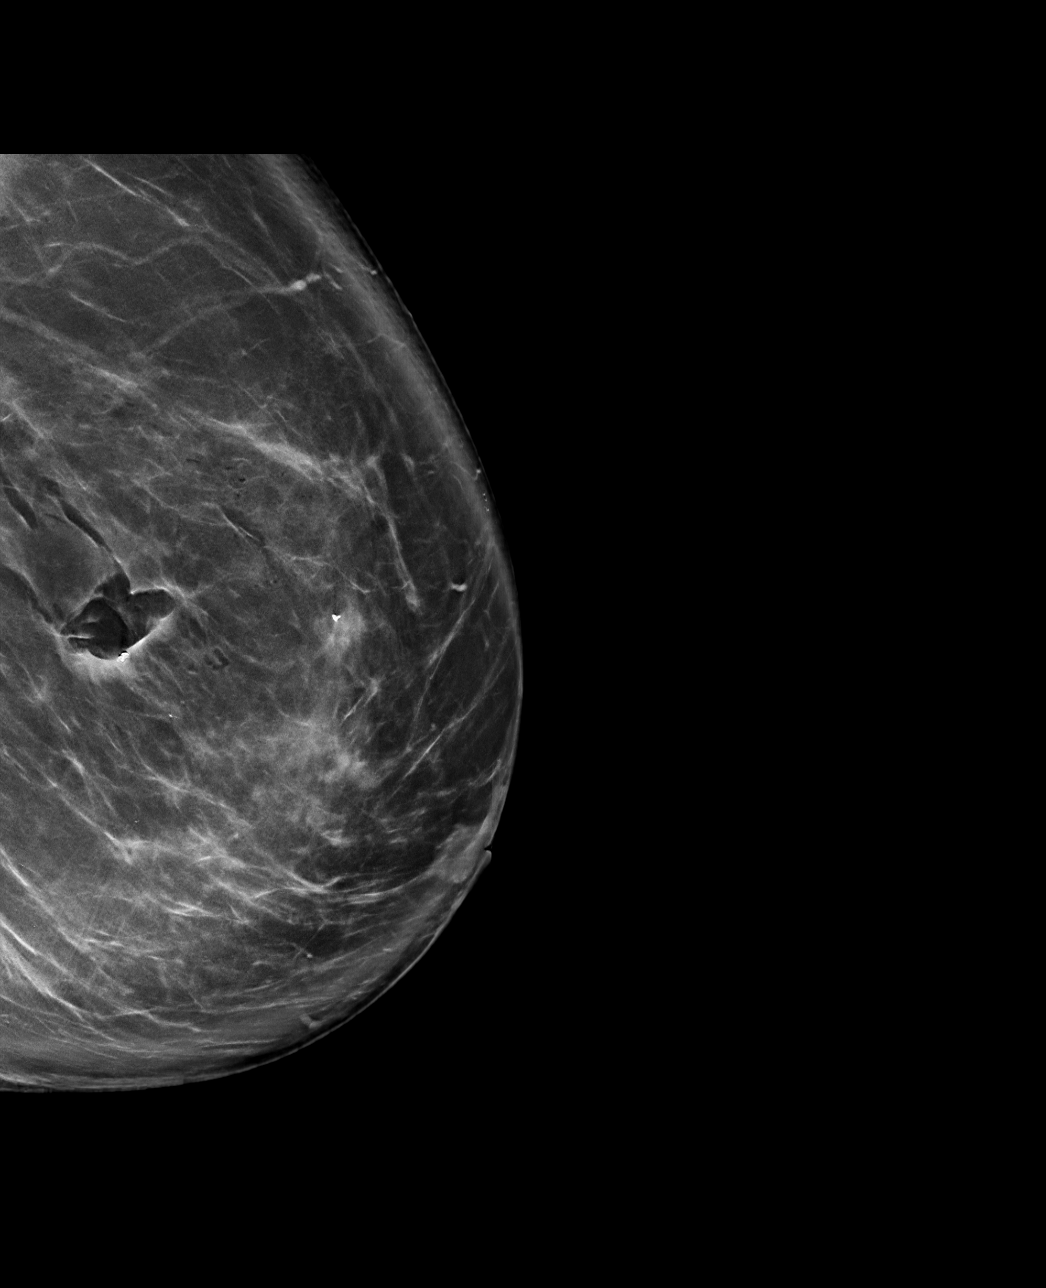

[L CC synth-2D]
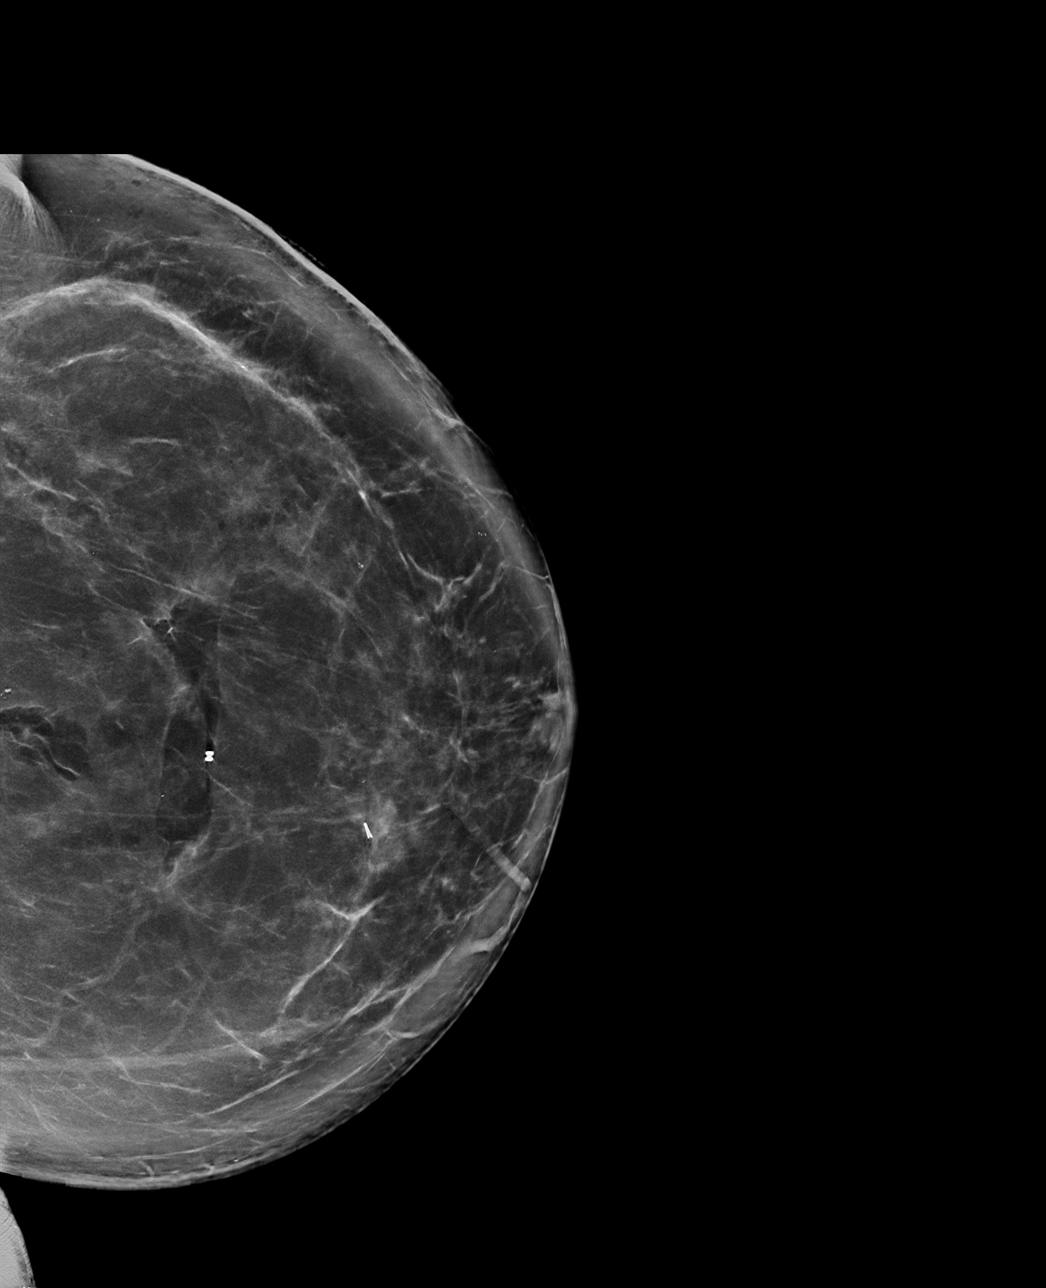

[L CC tomo · tomo slice 58/115.0]
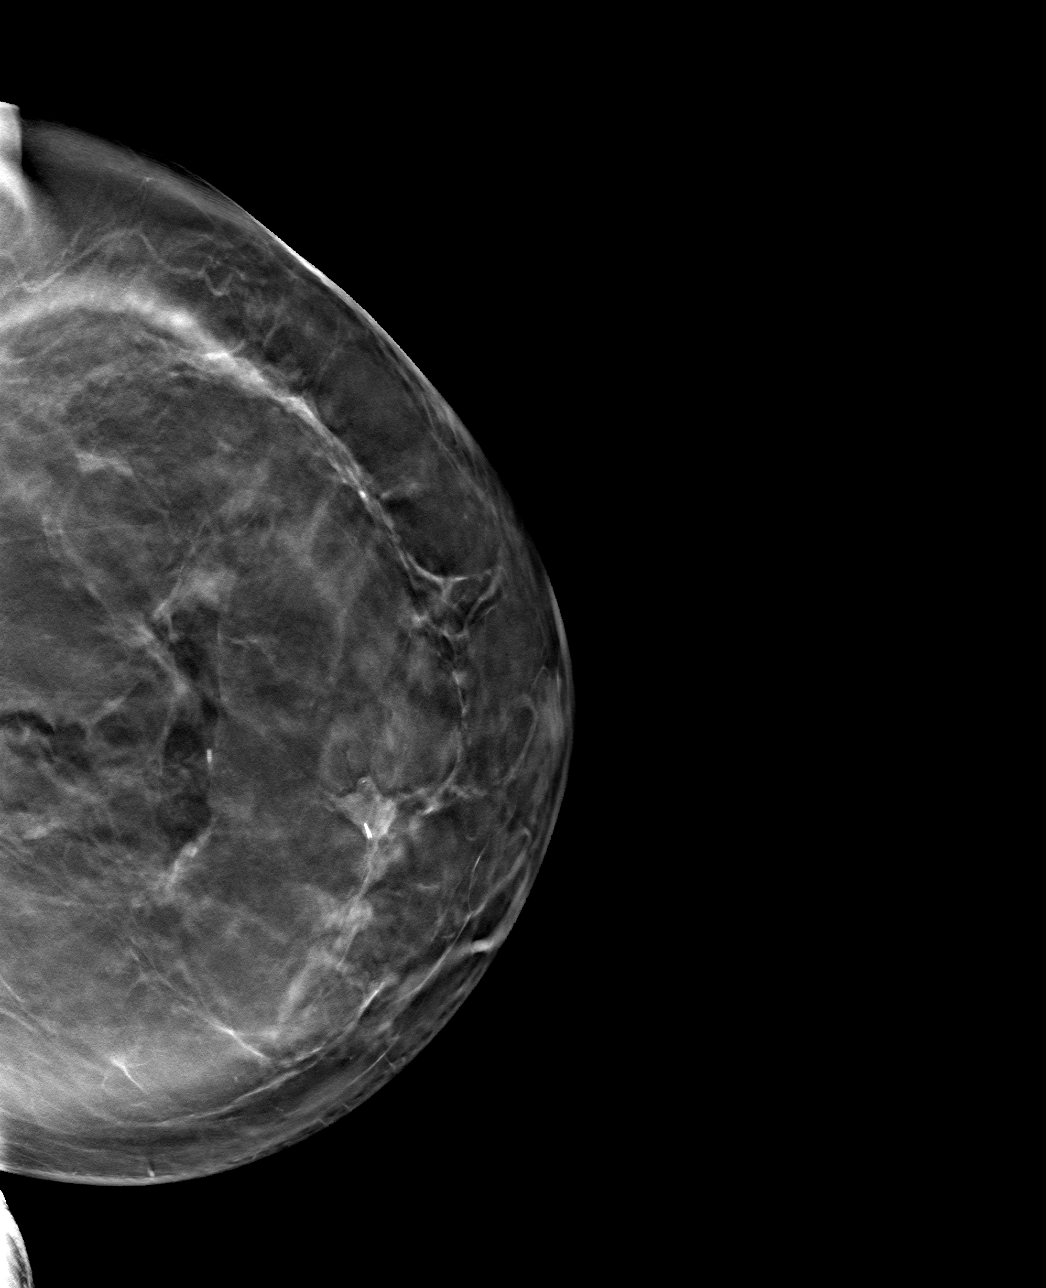

[L ML tomo · tomo slice 61/120.0]
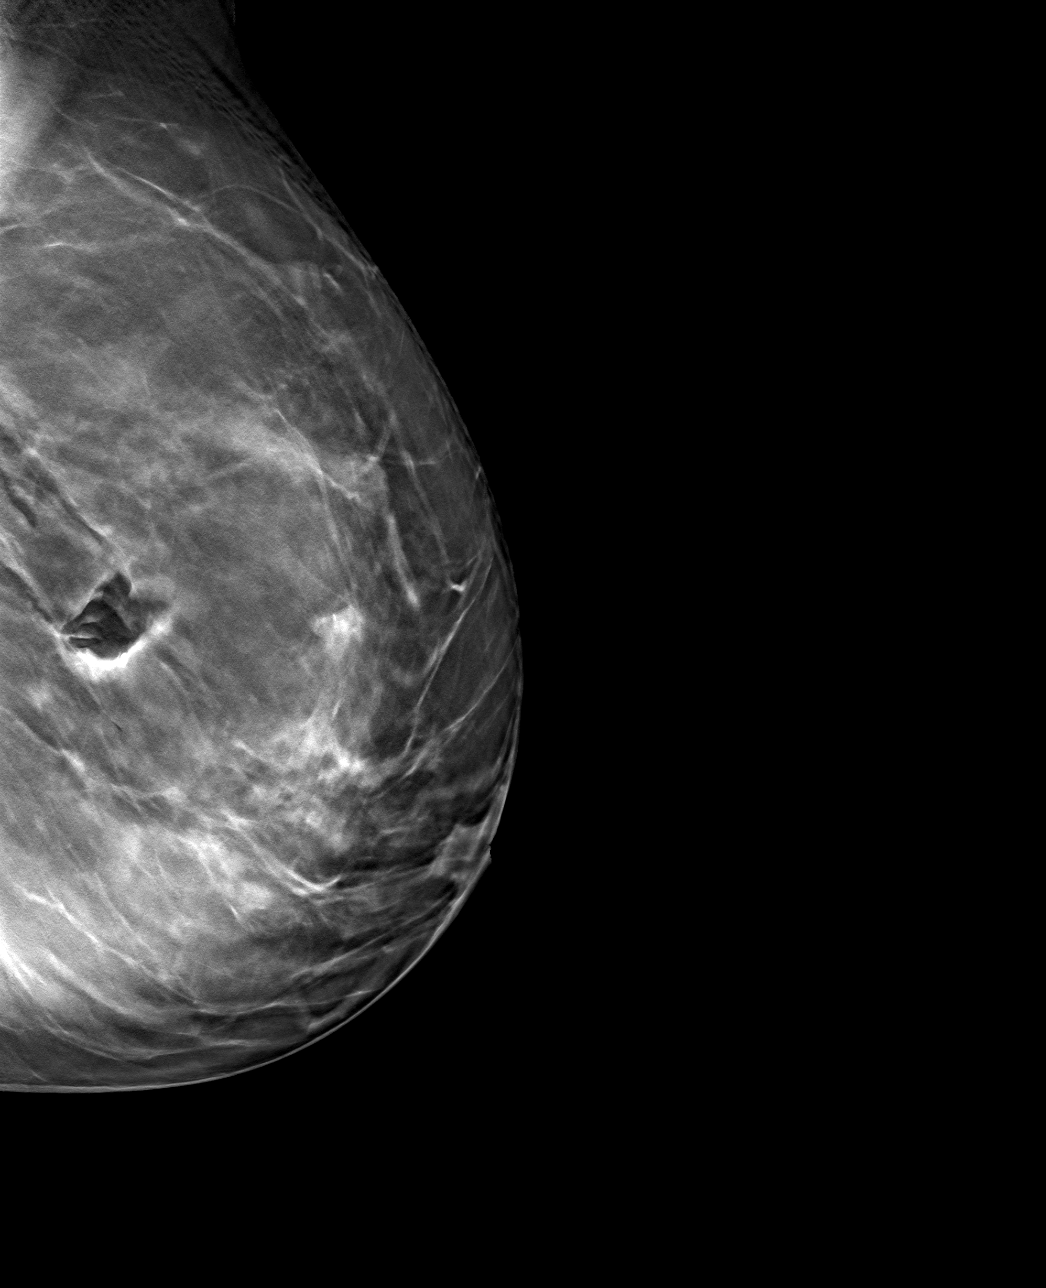

[4 of 12 positions shown; findings below may reference images not displayed]

FINDINGS: Mammographic images were obtained following MRI guided biopsy of a
mass in the superior central right breast. The barbell biopsy
marking clip is in expected position at the site of biopsy.

Mammographic images were obtained following MRI guided biopsy of a
mass in the superior central left breast. The barbell biopsy marking
clip is in expected position at the site of biopsy.
IMPRESSION: Appropriate positioning of the barbell shaped biopsy marking clip at
the site of biopsy in the superior central right breast.

Appropriate positioning of the barbell shaped biopsy marking clip at
the site of biopsy in the superior central left breast.

Final Assessment: Post Procedure Mammograms for Marker Placement

## 2022-03-23 ENCOUNTER — Ambulatory Visit
Admission: EM | Admit: 2022-03-23 | Discharge: 2022-03-23 | Disposition: A | Discharge status: Home / Self Care | Payer: Medicaid Other | Attending: Family Medicine | Admitting: Family Medicine

## 2022-03-23 DIAGNOSIS — M7989 Other specified soft tissue disorders: Secondary | ICD-10-CM

## 2022-03-23 MED ORDER — HYDROCHLOROTHIAZIDE 12.5 MG PO TABS: 12.5000 mg | ORAL_TABLET | Freq: Every day | ORAL | 0 refills | Status: DC

## 2022-03-23 NOTE — ED Provider Notes (Signed)
[facility]    CSN: [id number] Arrival date & time: 03/23/22  1321      History   Chief Complaint Chief Complaint  Patient presents with   Foot Problem    HPI Sharon Fischer is a 56 y.o. female.   Patient presenting today with 2-day history of bilateral foot swelling.  States the swelling started immediately after she was laying on the couch with her lower legs dangling down over the arm of the couch.  She denies significant pain, discoloration, fevers, chills, numbness or tingling beyond baseline, chest pain, shortness of breath, palpitations.  States this happened once before and she was placed on a fluid pill.  She does not wear compression stockings.  She is currently under going treatment through oncology for breast cancer and has a history of hypertension, hyperlipidemia.    Past Medical History:  Diagnosis Date   Breast cancer ([place]) 03/2020   Family history of throat cancer    High cholesterol    Hypertension     Patient Active Problem List   Diagnosis Date Noted   Encounter for gynecological examination with Papanicolaou smear of cervix 12/09/2021   History of breast cancer 12/09/2021   Generalized body aches 12/09/2021   Encounter for screening fecal occult blood testing 12/09/2021   Routine cervical smear 12/09/2021   Hematuria 12/09/2021   Vaginal atrophy 12/09/2021   Dyspnea on exertion 04/27/2021   Hypertension 04/27/2021   Smoker 04/27/2021   Mixed hyperlipidemia 04/27/2021   Breast cancer ([place]) 12/16/2020   Port-A-Cath in place 05/20/2020   Family history of throat cancer    Malignant neoplasm of upper-inner quadrant of left breast in female, estrogen receptor negative ([place]) 03/31/2020    Past Surgical History:  Procedure Laterality Date   AXILLARY SENTINEL NODE BIOPSY Right 12/16/2020   Procedure: TARGETED RIGHT SENTINEL LYMPH NODE DISSECTION;  Surgeon: Jovita Kussmaul, MD;  Location: [place];  Service: General;  Laterality: Right;    BREAST CYST EXCISION Left 12/16/2020   Procedure: CYST EXCISION LEFT AXILLA;  Surgeon: Jovita Kussmaul, MD;  Location: [place];  Service: General;  Laterality: Left;   BREAST RECONSTRUCTION WITH PLACEMENT OF TISSUE EXPANDER AND FLEX HD (ACELLULAR HYDRATED DERMIS) Bilateral 12/16/2020   Procedure: IMMEDIATE BILATERAL BREAST RECONSTRUCTION WITH PLACEMENT OF TISSUE EXPANDER AND FLEX HD (ACELLULAR HYDRATED DERMIS);  Surgeon: Wallace Going, DO;  Location: [place];  Service: Plastics;  Laterality: Bilateral;   CESAREAN SECTION     x 2   IR IMAGING GUIDED PORT INSERTION  03/30/2020   PORT-A-CATH REMOVAL Right 12/16/2020   Procedure: REMOVAL PORT-A-CATH;  Surgeon: Jovita Kussmaul, MD;  Location: [place];  Service: General;  Laterality: Right;   REMOVAL OF BILATERAL TISSUE EXPANDERS WITH PLACEMENT OF BILATERAL BREAST IMPLANTS Bilateral 02/22/2021   Procedure: Removal of bilateral breast expanders without implant placement;  Surgeon: Wallace Going, DO;  Location: [place];  Service: Plastics;  Laterality: Bilateral;   TOTAL MASTECTOMY Bilateral 12/16/2020   Procedure: TOTAL MASTECTOMY;  Surgeon: Jovita Kussmaul, MD;  Location: [place];  Service: General;  Laterality: Bilateral;    OB History     Gravida  2   Para  2   Term  1   Preterm  1   AB      Living  2      SAB      IAB      Ectopic      Multiple  Live Births  2            Home Medications    Prior to Admission medications   Medication Sig Start Date End Date Taking? Authorizing Provider  hydrochlorothiazide (HYDRODIURIL) 12.5 MG tablet Take 1 tablet (12.5 mg total) by mouth daily. 03/23/22  Yes Volney American, PA-C  acetaminophen (TYLENOL) 325 MG tablet Take 325 mg by mouth every 6 (six) hours as needed (FOR PAIN). Patient not taking: Reported on 12/23/2021    [provider]  albuterol (VENTOLIN HFA) 108 (90 Base) MCG/ACT inhaler Inhale 1-2 puffs into the lungs every 4 (four)  hours as needed for wheezing or shortness of breath. Patient not taking: Reported on 12/23/2021 08/12/20   Harle Stanford., PA-C  atorvastatin (LIPITOR) 20 MG tablet Take 20 mg by mouth at bedtime. Patient not taking: Reported on 12/23/2021 03/17/21   [provider]  cetirizine (ZYRTEC ALLERGY) 10 MG tablet Take 1 tablet (10 mg total) by mouth daily. 2/40/97   Prince Rome, PA-C  Ibuprofen (ADVIL PO) Take by mouth.    [provider]  prochlorperazine (COMPAZINE) 10 MG tablet Take 1 tablet (10 mg total) by mouth every 6 (six) hours as needed (Nausea or vomiting). Patient not taking: Reported on 12/03/2020 10/13/20 12/24/20  Nicholas Lose, MD    Family History Family History  Problem Relation Age of Onset   Throat cancer Maternal Uncle        dx >50, smoker   Throat cancer Maternal Uncle        dx >50, smoker   Cancer Cousin        unknown type, dx >50 (paternal first cousin)   Cancer Cousin        unknown type (paternal first cousin)    Social History Social History   Tobacco Use   Smoking status: Former    Packs/day: 0.25    Years: 25.00    Total pack years: 6.25    Types: Cigarettes   Smokeless tobacco: Never   Tobacco comments:    7-8 cigs /day  Vaping Use   Vaping Use: Never used  Substance Use Topics   Alcohol use: Not Currently   Drug use: Never     Allergies   Patient has no known allergies.   Review of Systems Review of Systems PER HPI  Physical Exam Triage Vital Signs ED Triage Vitals  Enc Vitals Group     BP 03/23/22 1330 104/69     Pulse Rate 03/23/22 1330 (!) 120     Resp 03/23/22 1330 18     Temp 03/23/22 1330 98 F (36.7 C)     Temp Source 03/23/22 1330 Oral     SpO2 03/23/22 1330 96 %     Weight --      Height --      Head Circumference --      Peak Flow --      Pain Score 03/23/22 1429 5     Pain Loc --      Pain Edu? --      Excl. in [place]? --    No data found.  Updated Vital Signs BP 104/69 (BP Location: Left  Arm)   Pulse (!) 115   Temp 98 F (36.7 C) (Oral)   Resp 18   SpO2 96%   Visual Acuity Right Eye Distance:   Left Eye Distance:   Bilateral Distance:    Right Eye Near:   Left Eye Near:  Bilateral Near:     Physical Exam Vitals and nursing note reviewed.  Constitutional:      Appearance: Normal appearance. She is not ill-appearing.  HENT:     Head: Atraumatic.  Eyes:     Extraocular Movements: Extraocular movements intact.     Conjunctiva/sclera: Conjunctivae normal.  Cardiovascular:     Rate and Rhythm: Normal rate and regular rhythm.     Heart sounds: Normal heart sounds.  Pulmonary:     Effort: Pulmonary effort is normal.     Breath sounds: Normal breath sounds.  Musculoskeletal:        General: Swelling present. No tenderness or signs of injury. Normal range of motion.     Cervical back: Normal range of motion and neck supple.     Comments: Bilateral feet symmetrically edematous, 1+  Skin:    General: Skin is warm and dry.     Findings: No bruising, erythema or rash.  Neurological:     Mental Status: She is alert and oriented to person, place, and time.     Comments: Lateral lower extremities neurovascularly at baseline  Psychiatric:        Mood and Affect: Mood normal.        Thought Content: Thought content normal.        Judgment: Judgment normal.      UC Treatments / Results  Labs (all labs ordered are listed, but only abnormal results are displayed) Labs Reviewed  BASIC METABOLIC PANEL    EKG   Radiology No results found.  Procedures Procedures (including critical care time)  Medications Ordered in UC Medications - No data to display  Initial Impression / Assessment and Plan / UC Course  I have reviewed the triage vital signs and the nursing notes.  Pertinent labs & imaging results that were available during my care of the patient were reviewed by me and considered in my medical decision making (see chart for details).     We will  treat with very low-dose HCTZ for several days, discussed importance of compression stockings, leg elevation, low-sodium diet, good fluid intake.  BMP pending for safety check.  Follow-up with PCP for recheck.  Final Clinical Impressions(s) / UC Diagnoses   Final diagnoses:  Foot swelling     Discharge Instructions      Elevate your legs at rest, wear compression stockings up to the knee until bedtime each day, make sure to drink plenty of water and get exercise, eat a low sodium diet.  Follow-up with your primary care provider soon as possible.    ED Prescriptions     Medication Sig Dispense Auth. Provider   hydrochlorothiazide (HYDRODIURIL) 12.5 MG tablet Take 1 tablet (12.5 mg total) by mouth daily. 5 tablet Volney American, [place]      PDMP not reviewed this encounter.   Volney American, [place] 03/23/22 1940

## 2022-03-23 NOTE — ED Triage Notes (Signed)
Pt reports bilateral feet swelling x 2 days. Pt reports this happened 1 time before and PCP prescribed a medication. Pt reports she called the PCP yesterday and was told to call her Oncologist and ruled out problems with her kidneys.

## 2022-03-23 NOTE — Discharge Instructions (Signed)
Elevate your legs at rest, wear compression stockings up to the knee until bedtime each day, make sure to drink plenty of water and get exercise, eat a low sodium diet.  Follow-up with your primary care provider soon as possible.

## 2022-03-26 LAB — BASIC METABOLIC PANEL
BUN/Creatinine Ratio: 19 (ref 9–23)
BUN: 12 mg/dL (ref 6–24)
CO2: 25 mmol/L (ref 20–29)
Calcium: 9.1 mg/dL (ref 8.7–10.2)
Chloride: 98 mmol/L (ref 96–106)
Creatinine, Ser: 0.62 mg/dL (ref 0.57–1.00)
Glucose: 95 mg/dL (ref 70–99)
Potassium: 3.7 mmol/L (ref 3.5–5.2)
Sodium: 137 mmol/L (ref 134–144)
eGFR: 103 mL/min/{1.73_m2} (ref >59)

## 2022-04-01 ENCOUNTER — Encounter: Payer: Self-pay | Admitting: Hematology and Oncology

## 2022-04-12 ENCOUNTER — Telehealth: Payer: Self-pay

## 2022-04-12 NOTE — Telephone Encounter (Signed)
Faxed Rx for BL prosthesis and mastectomy bras to [facility] with confirmed receipt.

## 2022-05-16 ENCOUNTER — Encounter: Payer: Self-pay | Admitting: Hematology and Oncology

## 2022-05-17 ENCOUNTER — Telehealth: Payer: Self-pay | Admitting: *Deleted

## 2022-05-17 ENCOUNTER — Other Ambulatory Visit: Payer: Self-pay | Admitting: Surgical

## 2022-05-17 NOTE — Telephone Encounter (Signed)
Received call from pt with complaint of constant nausea when eating.  Pt states she has lost weight and is unable to eat due to constant nausea.  Pt states she contacted PCP who has refused to see her and alerted pt she needed to f.u with our office for further evaluation and tx.  [facility] visit scheduled, pt educated and verbalized understanding.

## 2022-05-19 ENCOUNTER — Inpatient Hospital Stay: Payer: Medicaid Other

## 2022-05-19 ENCOUNTER — Inpatient Hospital Stay: Payer: Medicaid Other | Admitting: Adult Health

## 2022-06-10 ENCOUNTER — Telehealth: Payer: Self-pay

## 2022-06-10 NOTE — Telephone Encounter (Signed)
We need to see a copy of labs, and need an appointment with the patient to discuss any abnormalities and have the opportunity to discuss potential options with shared decision making.    Typically we do not manage thyroid problems, that is within the scope of PCP after patient has finished their active treatment, and since she does not to appear to have received any treatments that can impact the thyroid tests (please correct me if I am mistaken about the treatments she received).    I would like to look at her labs today to determine how quickly we need to see her.

## 2022-06-10 NOTE — Telephone Encounter (Signed)
Sharon Fischer recently saw her primary care provider and she reported that she has been losing hair and she is feeling tired more often.  She last received Herceptin in 05/2021 but she still sees Dr. Lindi Adie for follow ups.  Her next follow up is scheduled in April 2023.  Her labs were done with her PCP and her TSH was 4.78, Free T4 1.2, and T4 was 4.4.  She also had low WBC of 2.9 and ANC of 751.  Hgb was 11.1 and Hct was 33.1. Sodium was 130 and Chloride was 93. Her PCP wanted to see if we wanted to prescribe her something for her thyroid issues and symptoms.  Lab results are scanned in EPIC. Please advise and I will call her back. Gardiner Rhyme, RN

## 2022-06-10 NOTE — Telephone Encounter (Signed)
She is scheduled for a symptom management visit on 06/16/22 in the afternoon per Lindsey's request.  She knows she will have labs and office visit. Gardiner Rhyme, RN

## 2022-06-10 NOTE — Telephone Encounter (Signed)
Called Mrs. Maxcy back to get her in with [facility]

## 2022-06-16 ENCOUNTER — Other Ambulatory Visit: Payer: Self-pay

## 2022-06-16 ENCOUNTER — Inpatient Hospital Stay (HOSPITAL_BASED_OUTPATIENT_CLINIC_OR_DEPARTMENT_OTHER): Payer: Medicaid Other | Admitting: Adult Health

## 2022-06-16 ENCOUNTER — Inpatient Hospital Stay: Payer: Medicaid Other | Attending: Adult Health | Admitting: Adult Health

## 2022-06-16 ENCOUNTER — Encounter: Payer: Self-pay | Admitting: Adult Health

## 2022-06-16 VITALS — BP 136/66 | HR 97 | Temp 97.7°F | Resp 18 | Wt 122.6 lb

## 2022-06-16 DIAGNOSIS — D649 Anemia, unspecified: Secondary | ICD-10-CM

## 2022-06-16 DIAGNOSIS — Z87891 Personal history of nicotine dependence: Secondary | ICD-10-CM | POA: Diagnosis not present

## 2022-06-16 DIAGNOSIS — R946 Abnormal results of thyroid function studies: Secondary | ICD-10-CM | POA: Diagnosis not present

## 2022-06-16 DIAGNOSIS — D708 Other neutropenia: Secondary | ICD-10-CM

## 2022-06-16 DIAGNOSIS — C50819 Malignant neoplasm of overlapping sites of unspecified female breast: Secondary | ICD-10-CM | POA: Diagnosis not present

## 2022-06-16 DIAGNOSIS — Z853 Personal history of malignant neoplasm of breast: Secondary | ICD-10-CM | POA: Insufficient documentation

## 2022-06-16 DIAGNOSIS — E871 Hypo-osmolality and hyponatremia: Secondary | ICD-10-CM

## 2022-06-16 DIAGNOSIS — Z171 Estrogen receptor negative status [ER-]: Secondary | ICD-10-CM

## 2022-06-16 DIAGNOSIS — M79602 Pain in left arm: Secondary | ICD-10-CM | POA: Diagnosis present

## 2022-06-16 DIAGNOSIS — J90 Pleural effusion, not elsewhere classified: Secondary | ICD-10-CM | POA: Diagnosis not present

## 2022-06-16 DIAGNOSIS — C50212 Malignant neoplasm of upper-inner quadrant of left female breast: Secondary | ICD-10-CM

## 2022-06-16 DIAGNOSIS — K7689 Other specified diseases of liver: Secondary | ICD-10-CM | POA: Diagnosis not present

## 2022-06-16 DIAGNOSIS — Z9013 Acquired absence of bilateral breasts and nipples: Secondary | ICD-10-CM | POA: Diagnosis not present

## 2022-06-16 DIAGNOSIS — R110 Nausea: Secondary | ICD-10-CM | POA: Insufficient documentation

## 2022-06-16 LAB — CMP (CANCER CENTER ONLY)
ALT: 11 U/L (ref 0–44)
AST: 25 U/L (ref 15–41)
Albumin: 3.9 g/dL (ref 3.5–5.0)
Alkaline Phosphatase: 134 U/L — ABNORMAL HIGH (ref 38–126)
Anion gap: 4 — ABNORMAL LOW (ref 5–15)
BUN: 7 mg/dL (ref 6–20)
CO2: 27 mmol/L (ref 22–32)
Calcium: 9.4 mg/dL (ref 8.9–10.3)
Chloride: 108 mmol/L (ref 98–111)
Creatinine: 0.65 mg/dL (ref 0.44–1.00)
GFR, Estimated: >60 mL/min (ref >60)
Glucose, Bld: 70 mg/dL (ref 70–99)
Potassium: 3.8 mmol/L (ref 3.5–5.1)
Sodium: 139 mmol/L (ref 135–145)
Total Bilirubin: 0.5 mg/dL (ref 0.3–1.2)
Total Protein: 6.6 g/dL (ref 6.5–8.1)

## 2022-06-16 LAB — CBC WITH DIFFERENTIAL (CANCER CENTER ONLY)
Abs Immature Granulocytes: 0 10*3/uL (ref 0.00–0.07)
Basophils Absolute: 0 10*3/uL (ref 0.0–0.1)
Basophils Relative: 1 %
Eosinophils Absolute: 0.2 10*3/uL (ref 0.0–0.5)
Eosinophils Relative: 5 %
HCT: 33.6 % — ABNORMAL LOW (ref 36.0–46.0)
Hemoglobin: 11.4 g/dL — ABNORMAL LOW (ref 12.0–15.0)
Immature Granulocytes: 0 %
Lymphocytes Relative: 58 %
Lymphs Abs: 2.1 10*3/uL (ref 0.7–4.0)
MCH: 27.5 pg (ref 26.0–34.0)
MCHC: 33.9 g/dL (ref 30.0–36.0)
MCV: 81.2 fL (ref 80.0–100.0)
Monocytes Absolute: 0.3 10*3/uL (ref 0.1–1.0)
Monocytes Relative: 8 %
Neutro Abs: 1 10*3/uL — ABNORMAL LOW (ref 1.7–7.7)
Neutrophils Relative %: 28 %
Platelet Count: 201 10*3/uL (ref 150–400)
RBC: 4.14 MIL/uL (ref 3.87–5.11)
RDW: 13.2 % (ref 11.5–15.5)
WBC Count: 3.6 10*3/uL — ABNORMAL LOW (ref 4.0–10.5)
nRBC: 0 % (ref 0.0–0.2)

## 2022-06-16 LAB — T4, FREE: Free T4: 0.74 ng/dL (ref 0.61–1.12)

## 2022-06-16 LAB — SAVE SMEAR(SSMR), FOR PROVIDER SLIDE REVIEW

## 2022-06-16 LAB — RETIC PANEL
Immature Retic Fract: 3.9 % (ref 2.3–15.9)
RBC.: 4.13 MIL/uL (ref 3.87–5.11)
Retic Count, Absolute: 48.3 10*3/uL (ref 19.0–186.0)
Retic Ct Pct: 1.2 % (ref 0.4–3.1)
Reticulocyte Hemoglobin: 30.4 pg (ref >27.9)

## 2022-06-16 LAB — TSH: TSH: 5.468 u[IU]/mL — ABNORMAL HIGH (ref 0.350–4.500)

## 2022-06-16 NOTE — Progress Notes (Signed)
[facility] Follow up:    Raiford Simmonds., PA-C [place] [address] [place] [address]   DIAGNOSIS:  Cancer Staging  Malignant neoplasm of upper-inner quadrant of left breast in female, estrogen receptor negative ([place]) Staging form: Breast, AJCC 8th Edition - Clinical stage from 03/31/2020: Stage IIIB (cT2, cN1(f), cM0, G3, ER-, PR-, HER2-) - Signed by Nicholas Lose, MD on 04/02/2020 Stage prefix: Initial diagnosis Method of lymph node assessment: Core biopsy Histologic grading system: 3 grade system   SUMMARY OF ONCOLOGIC HISTORY: Oncology History  Malignant neoplasm of upper-inner quadrant of left breast in female, estrogen receptor negative ([place])  01/2020 Initial Diagnosis   Palpable right breast mass growing since July 2021.  Mammogram revealed 2.2 cm right breast mass with a 0.8 cm satellite lesion.  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   03/31/2020 Cancer Staging   Staging form: Breast, AJCC 8th Edition - Clinical stage from 03/31/2020: Stage IIIB (cT2, cN1(f), cM0, G3, ER-, PR-, HER2-) - Signed by Nicholas Lose, MD on 04/02/2020   04/09/2020 - 08/06/2020 Chemotherapy    Patient is on Treatment Plan: BREAST AC Q21D / CARBOPLATIN D1 + PACLITAXEL D1,8,15 Q21D, Keytruda discontinued for pneumonitis and Taxol discontinued after 4 cycles because of failure to thrive       12/16/2020 Surgery   Bilateral mastectomies   Left mastectomy: Intraductal papilloma, UDH Right mastectomy: No residual carcinoma was 1/3 lymph nodes positive ER negative, PR +20%, HER2 2+ by IHC, positive by FISH ratio 2.03   02/05/2021 - 05/21/2021 Chemotherapy   Patient is on Treatment Plan : BREAST Trastuzumab q21d       CURRENT THERAPY: Observation  INTERVAL HISTORY: Sharon Fischer 57 y.o. female returns for f/u of new onset fatigue that began in November  of 2023.  She also has new onset left arm pain.  Her left arm pain began today.  She is not taking Lipitor, she was told it was making her hurt so she stopped it.  She also is not taking the hydrochlorothiazide on her list.  She has not had any recent respiratory viruses such as flu, COVID, or RSV.  She saw her PCP back in 04/2022 and her thyroid test was abnormal.  The test was faxed to our office.  She also had other labs that were not faxed and/or scanned into our system.  Sharon Fischer has also had a generalized decrease in her appetite and nausea after she eats.  She has some stomach discomfort and generalized aching.   Patient Active Problem List   Diagnosis Date Noted   Encounter for gynecological examination with Papanicolaou smear of cervix 12/09/2021   History of breast cancer 12/09/2021   Generalized body aches 12/09/2021   Encounter for screening fecal occult blood testing 12/09/2021   Routine cervical smear 12/09/2021   Hematuria 12/09/2021   Vaginal atrophy 12/09/2021   Dyspnea on exertion 04/27/2021   Hypertension 04/27/2021   Smoker 04/27/2021   Mixed hyperlipidemia 04/27/2021   Breast cancer ([place]) 12/16/2020   Port-A-Cath in place 05/20/2020   Family history of throat cancer    Malignant neoplasm of upper-inner quadrant of left breast in female, estrogen receptor negative ([place]) 03/31/2020    has No Known Allergies.  MEDICAL HISTORY: Past Medical History:  Diagnosis Date   Breast cancer ([place]) 03/2020   Family history of  throat cancer    High cholesterol    Hypertension     SURGICAL HISTORY: Past Surgical History:  Procedure Laterality Date   AXILLARY SENTINEL NODE BIOPSY Right 12/16/2020   Procedure: TARGETED RIGHT SENTINEL LYMPH NODE DISSECTION;  Surgeon: Jovita Kussmaul, MD;  Location: [place];  Service: General;  Laterality: Right;   BREAST CYST EXCISION Left 12/16/2020   Procedure: CYST EXCISION LEFT AXILLA;  Surgeon: Jovita Kussmaul, MD;  Location: [place];  Service:  General;  Laterality: Left;   BREAST RECONSTRUCTION WITH PLACEMENT OF TISSUE EXPANDER AND FLEX HD (ACELLULAR HYDRATED DERMIS) Bilateral 12/16/2020   Procedure: IMMEDIATE BILATERAL BREAST RECONSTRUCTION WITH PLACEMENT OF TISSUE EXPANDER AND FLEX HD (ACELLULAR HYDRATED DERMIS);  Surgeon: Wallace Going, DO;  Location: [place];  Service: Plastics;  Laterality: Bilateral;   CESAREAN SECTION     x 2   IR IMAGING GUIDED PORT INSERTION  03/30/2020   PORT-A-CATH REMOVAL Right 12/16/2020   Procedure: REMOVAL PORT-A-CATH;  Surgeon: Jovita Kussmaul, MD;  Location: [place];  Service: General;  Laterality: Right;   REMOVAL OF BILATERAL TISSUE EXPANDERS WITH PLACEMENT OF BILATERAL BREAST IMPLANTS Bilateral 02/22/2021   Procedure: Removal of bilateral breast expanders without implant placement;  Surgeon: Wallace Going, DO;  Location: [place];  Service: Plastics;  Laterality: Bilateral;   TOTAL MASTECTOMY Bilateral 12/16/2020   Procedure: TOTAL MASTECTOMY;  Surgeon: Jovita Kussmaul, MD;  Location: [facility];  Service: General;  Laterality: Bilateral;    SOCIAL HISTORY: Social History   Socioeconomic History   Marital status: Single    Spouse name: Not on file   Number of children: Not on file   Years of education: Not on file   Highest education level: Not on file  Occupational History   Not on file  Tobacco Use   Smoking status: Former    Packs/day: 0.25    Years: 25.00    Total pack years: 6.25    Types: Cigarettes   Smokeless tobacco: Never   Tobacco comments:    7-8 cigs /day  Vaping Use   Vaping Use: Never used  Substance and Sexual Activity   Alcohol use: Not Currently   Drug use: Never   Sexual activity: Not Currently    Birth control/protection: Post-menopausal  Other Topics Concern   Not on file  Social History Narrative   Not on file   Social Determinants of Health   Financial Resource Strain: Low Risk  (12/09/2021)   Overall Financial Resource Strain  (CARDIA)    Difficulty of Paying Living Expenses: Not hard at all  Food Insecurity: No Food Insecurity (12/09/2021)   Hunger Vital Sign    Worried About Running Out of Food in the Last Year: Never true    Ran Out of Food in the Last Year: Never true  Transportation Needs: No Transportation Needs (12/09/2021)   PRAPARE - Hydrologist (Medical): No    Lack of Transportation (Non-Medical): No  Physical Activity: Insufficiently Active (12/09/2021)   Exercise Vital Sign    Days of Exercise per Week: 2 days    Minutes of Exercise per Session: 20 min  Stress: No Stress Concern Present (12/09/2021)   [place]    Feeling of Stress : Not at all  Social Connections: Socially Isolated (12/09/2021)   Social Connection and Isolation Panel [NHANES]    Frequency of Communication with Friends and Family: Three times  a week    Frequency of Social Gatherings with Friends and Family: Three times a week    Attends Religious Services: Never    Active Member of Clubs or Organizations: No    Attends Archivist Meetings: Never    Marital Status: Never married  Intimate Partner Violence: Not At Risk (12/09/2021)   Humiliation, Afraid, Rape, and Kick questionnaire    Fear of Current or Ex-Partner: No    Emotionally Abused: No    Physically Abused: No    Sexually Abused: No    FAMILY HISTORY: Family History  Problem Relation Age of Onset   Throat cancer Maternal Uncle        dx >50, smoker   Throat cancer Maternal Uncle        dx >50, smoker   Cancer Cousin        unknown type, dx >50 (paternal first cousin)   Cancer Cousin        unknown type (paternal first cousin)    Review of Systems  Constitutional:  Negative for appetite change, chills, fatigue, fever and unexpected weight change.  HENT:   Negative for hearing loss, lump/mass and trouble swallowing.   Eyes:  Negative for eye problems and  icterus.  Respiratory:  Negative for chest tightness, cough and shortness of breath.   Cardiovascular:  Negative for chest pain, leg swelling and palpitations.  Gastrointestinal:  Positive for nausea. Negative for abdominal distention, abdominal pain, constipation, diarrhea and vomiting.  Endocrine: Negative for hot flashes.  Genitourinary:  Negative for difficulty urinating.   Musculoskeletal:  Negative for arthralgias.  Skin:  Negative for itching and rash.  Neurological:  Negative for dizziness, extremity weakness, headaches and numbness.  Hematological:  Negative for adenopathy. Does not bruise/bleed easily.  Psychiatric/Behavioral:  Negative for depression. The patient is not nervous/anxious.       PHYSICAL EXAMINATION  ECOG PERFORMANCE STATUS: 1 - Symptomatic but completely ambulatory  Vitals:   06/16/22 1353  BP: 136/66  Pulse: 97  Resp: 18  Temp: 97.7 F (36.5 C)  SpO2: 100%    Physical Exam Constitutional:      General: She is not in acute distress.    Appearance: Normal appearance. She is not toxic-appearing.  HENT:     Head: Normocephalic and atraumatic.  Eyes:     General: No scleral icterus. Cardiovascular:     Rate and Rhythm: Normal rate and regular rhythm.     Pulses: Normal pulses.     Heart sounds: Normal heart sounds.  Pulmonary:     Effort: Pulmonary effort is normal.     Breath sounds: Normal breath sounds.  Abdominal:     General: Abdomen is flat. Bowel sounds are normal. There is no distension.     Palpations: Abdomen is soft.     Tenderness: There is no abdominal tenderness.  Musculoskeletal:        General: No swelling.     Cervical back: Neck supple.  Lymphadenopathy:     Cervical: No cervical adenopathy.  Skin:    General: Skin is warm and dry.     Findings: No rash.  Neurological:     General: No focal deficit present.     Mental Status: She is alert.  Psychiatric:        Mood and Affect: Mood normal.        Behavior: Behavior  normal.     LABORATORY DATA:  CBC    Component Value Date/Time   WBC  3.6 (L) 06/16/2022 1259   WBC 3.3 (L) 12/23/2021 1105   RBC 4.13 06/16/2022 1259   RBC 4.14 06/16/2022 1259   HGB 11.4 (L) 06/16/2022 1259   HCT 33.6 (L) 06/16/2022 1259   PLT 201 06/16/2022 1259   MCV 81.2 06/16/2022 1259   MCH 27.5 06/16/2022 1259   MCHC 33.9 06/16/2022 1259   RDW 13.2 06/16/2022 1259   LYMPHSABS 2.1 06/16/2022 1259   MONOABS 0.3 06/16/2022 1259   EOSABS 0.2 06/16/2022 1259   BASOSABS 0.0 06/16/2022 1259    CMP     Component Value Date/Time   NA 139 06/16/2022 1259   NA 137 03/23/2022 1427   K 3.8 06/16/2022 1259   CL 108 06/16/2022 1259   CO2 27 06/16/2022 1259   GLUCOSE 70 06/16/2022 1259   BUN 7 06/16/2022 1259   BUN 12 03/23/2022 1427   CREATININE 0.65 06/16/2022 1259   CALCIUM 9.4 06/16/2022 1259   PROT 6.6 06/16/2022 1259   ALBUMIN 3.9 06/16/2022 1259   AST 25 06/16/2022 1259   ALT 11 06/16/2022 1259   ALKPHOS 134 (H) 06/16/2022 1259   BILITOT 0.5 06/16/2022 1259   GFRNONAA >60 06/16/2022 1259   GFRAA  02/05/2007 1044    >60        The eGFR has been calculated using the MDRD equation. This calculation has not been validated in all clinical       ASSESSMENT and THERAPY PLAN:   Malignant neoplasm of upper-inner quadrant of left breast in female, estrogen receptor negative ([place]) Sharon Fischer is a 57 year old woman with history of stage IIIb triple negative breast cancer diagnosed in October 2021 she is status post neoadjuvant chemotherapy followed by bilateral mastectomies followed by antiestrogen therapy with Herceptin as final path showed HER2 positivity and a lymph node.  Sharon Fischer is increasingly tired therefore we are getting labs today with CBC c-Met TSH free T4.  I am concerned that we could be dealing with a cancer recurrence considering her symptoms.  I recommended that she undergo CT chest abdomen pelvis to rule that out.  I will see her back in 2 weeks to  follow-up on that testing and next steps.   All questions were answered. The patient knows to call the clinic with any problems, questions or concerns. We can certainly see the patient much sooner if necessary.  Total encounter time:30 minutes*in face-to-face visit time, chart review, lab review, care coordination, order entry, and documentation of the encounter time.    Wilber Bihari, NP 06/17/22 3:40 PM Medical Oncology and Hematology [facility] [place], [place] [address] Tel. [phone]    Fax. [phone]  *Total Encounter Time as defined by the Centers for Medicare and Medicaid Services includes, in addition to the face-to-face time of a patient visit (documented in the note above) non-face-to-face time: obtaining and reviewing outside history, ordering and reviewing medications, tests or procedures, care coordination (communications with other health care professionals or caregivers) and documentation in the medical record.

## 2022-06-17 NOTE — Assessment & Plan Note (Signed)
Sharon Fischer is a 57 year old woman with history of stage IIIb triple negative breast cancer diagnosed in October 2021 she is status post neoadjuvant chemotherapy followed by bilateral mastectomies followed by antiestrogen therapy with Herceptin as final path showed HER2 positivity and a lymph node.  [name] is increasingly tired therefore we are getting labs today with CBC c-Met TSH free T4.  I am concerned that we could be dealing with a cancer recurrence considering her symptoms.  I recommended that she undergo CT chest abdomen pelvis to rule that out.  I will see her back in 2 weeks to follow-up on that testing and next steps.

## 2022-06-21 NOTE — Progress Notes (Signed)
[facility] Dept and LVM for [place], [place] to take over management of TSH levels as Pt has completed tx per Sharon Bihari, NP. Faxed latest TSH and office visit to [phone] with receipt confirmation. Gave call back number with any questions.

## 2022-06-29 ENCOUNTER — Telehealth: Payer: Self-pay | Admitting: *Deleted

## 2022-06-29 NOTE — Telephone Encounter (Signed)
Pt called to cancel 1/19 appts. Stated that her PCP will see her and appreciated the efforts.

## 2022-06-30 ENCOUNTER — Other Ambulatory Visit: Payer: Self-pay | Admitting: *Deleted

## 2022-06-30 DIAGNOSIS — C50819 Malignant neoplasm of overlapping sites of unspecified female breast: Secondary | ICD-10-CM

## 2022-06-30 NOTE — Progress Notes (Signed)
Received call from Sharon Fischer stating pt is experiencing decreased ROM with right upper extremity. RN reviewed with MD and verbal orders received for physical therapy.  Referral placed to [facility] to be closer to pt home.

## 2022-07-01 ENCOUNTER — Inpatient Hospital Stay: Payer: Medicaid Other | Admitting: Adult Health

## 2022-07-01 ENCOUNTER — Inpatient Hospital Stay: Payer: Medicaid Other

## 2022-07-05 ENCOUNTER — Encounter (HOSPITAL_COMMUNITY): Payer: Medicaid Other | Admitting: Occupational Therapy

## 2022-07-06 ENCOUNTER — Ambulatory Visit (HOSPITAL_COMMUNITY)
Admission: RE | Admit: 2022-07-06 | Discharge: 2022-07-06 | Disposition: A | Discharge status: Home / Self Care | Payer: Medicaid Other | Source: Ambulatory Visit | Attending: Adult Health | Admitting: Adult Health

## 2022-07-06 DIAGNOSIS — E871 Hypo-osmolality and hyponatremia: Secondary | ICD-10-CM | POA: Insufficient documentation

## 2022-07-06 DIAGNOSIS — D649 Anemia, unspecified: Secondary | ICD-10-CM | POA: Diagnosis present

## 2022-07-06 DIAGNOSIS — C50819 Malignant neoplasm of overlapping sites of unspecified female breast: Secondary | ICD-10-CM | POA: Insufficient documentation

## 2022-07-06 DIAGNOSIS — D708 Other neutropenia: Secondary | ICD-10-CM | POA: Insufficient documentation

## 2022-07-06 DIAGNOSIS — R946 Abnormal results of thyroid function studies: Secondary | ICD-10-CM | POA: Diagnosis present

## 2022-07-06 MED ORDER — IOHEXOL 300 MG/ML  SOLN
100.0000 mL | Freq: Once | INTRAMUSCULAR | Status: AC | PRN
  Administered 2022-07-06: 100 mL via INTRAVENOUS

## 2022-07-07 ENCOUNTER — Ambulatory Visit (HOSPITAL_COMMUNITY): Payer: Medicaid Other | Attending: Hematology and Oncology | Admitting: Occupational Therapy

## 2022-07-07 DIAGNOSIS — M25511 Pain in right shoulder: Secondary | ICD-10-CM | POA: Diagnosis not present

## 2022-07-07 DIAGNOSIS — C50819 Malignant neoplasm of overlapping sites of unspecified female breast: Secondary | ICD-10-CM | POA: Diagnosis not present

## 2022-07-07 DIAGNOSIS — M25611 Stiffness of right shoulder, not elsewhere classified: Secondary | ICD-10-CM | POA: Insufficient documentation

## 2022-07-07 DIAGNOSIS — G8929 Other chronic pain: Secondary | ICD-10-CM | POA: Diagnosis present

## 2022-07-07 DIAGNOSIS — R29898 Other symptoms and signs involving the musculoskeletal system: Secondary | ICD-10-CM | POA: Insufficient documentation

## 2022-07-07 NOTE — Therapy (Signed)
OUTPATIENT OCCUPATIONAL THERAPY ORTHO EVALUATION  Patient Name: Sharon Fischer MRN: [id number] DOB:[birth year], 57 y.o., female Today's Date: 07/07/2022  PCP: Raiford Simmonds., PA-C REFERRING PROVIDER: Nicholas Lose, MD  END OF SESSION:  OT End of Session - 07/07/22 1028     Visit Number 1    Number of Visits 13    Date for OT Re-Evaluation 08/26/22    Authorization Type Medicaid [employer]    Progress Note Due on Visit 10    OT Start Time 0905    OT Stop Time 0945    OT Time Calculation (min) 40 min    Activity Tolerance Patient tolerated treatment well    Behavior During Therapy [facility] for tasks assessed/performed;Flat affect             Past Medical History:  Diagnosis Date   Breast cancer ([place]) 03/2020   Family history of throat cancer    High cholesterol    Hypertension    Past Surgical History:  Procedure Laterality Date   AXILLARY SENTINEL NODE BIOPSY Right 12/16/2020   Procedure: TARGETED RIGHT SENTINEL LYMPH NODE DISSECTION;  Surgeon: Jovita Kussmaul, MD;  Location: [facility];  Service: General;  Laterality: Right;   BREAST CYST EXCISION Left 12/16/2020   Procedure: CYST EXCISION LEFT AXILLA;  Surgeon: Jovita Kussmaul, MD;  Location: [facility];  Service: General;  Laterality: Left;   BREAST RECONSTRUCTION WITH PLACEMENT OF TISSUE EXPANDER AND FLEX HD (ACELLULAR HYDRATED DERMIS) Bilateral 12/16/2020   Procedure: IMMEDIATE BILATERAL BREAST RECONSTRUCTION WITH PLACEMENT OF TISSUE EXPANDER AND FLEX HD (ACELLULAR HYDRATED DERMIS);  Surgeon: Wallace Going, DO;  Location: [facility];  Service: Plastics;  Laterality: Bilateral;   CESAREAN SECTION     x 2   IR IMAGING GUIDED PORT INSERTION  03/30/2020   PORT-A-CATH REMOVAL Right 12/16/2020   Procedure: REMOVAL PORT-A-CATH;  Surgeon: Jovita Kussmaul, MD;  Location: [facility];  Service: General;  Laterality: Right;   REMOVAL OF BILATERAL TISSUE EXPANDERS WITH PLACEMENT OF BILATERAL BREAST IMPLANTS Bilateral 02/22/2021   Procedure:  Removal of bilateral breast expanders without implant placement;  Surgeon: Wallace Going, DO;  Location: [facility];  Service: Plastics;  Laterality: Bilateral;   TOTAL MASTECTOMY Bilateral 12/16/2020   Procedure: TOTAL MASTECTOMY;  Surgeon: Jovita Kussmaul, MD;  Location: [facility];  Service: General;  Laterality: Bilateral;   Patient Active Problem List   Diagnosis Date Noted   Encounter for gynecological examination with Papanicolaou smear of cervix 12/09/2021   History of breast cancer 12/09/2021   Generalized body aches 12/09/2021   Encounter for screening fecal occult blood testing 12/09/2021   Routine cervical smear 12/09/2021   Hematuria 12/09/2021   Vaginal atrophy 12/09/2021   Dyspnea on exertion 04/27/2021   Hypertension 04/27/2021   Smoker 04/27/2021   Mixed hyperlipidemia 04/27/2021   Breast cancer ([place]) 12/16/2020   Port-A-Cath in place 05/20/2020   Family history of throat cancer    Malignant neoplasm of upper-inner quadrant of left breast in female, estrogen receptor negative ([place]) 03/31/2020    ONSET DATE: 2021  REFERRING DIAG: Limited ROM of RUE  THERAPY DIAG:  Chronic right shoulder pain  Shoulder stiffness, right  Other symptoms and signs involving the musculoskeletal system  Rationale for Evaluation and Treatment: Rehabilitation  SUBJECTIVE:   SUBJECTIVE STATEMENT: "I can't reach my kitchen cabinets." Pt accompanied by: self  PERTINENT HISTORY: Pt was diagnosed with breast cancer in 2021. Pt underwent chemo, radiation, and double mastectomy. Since the  radiation, pt reports that she has had difficulty lifting her RUE.  PRECAUTIONS: None  WEIGHT BEARING RESTRICTIONS: No  PAIN:  Are you having pain? No  FALLS: Has patient fallen in last 6 months? No  LIVING ENVIRONMENT: Lives with: lives with their family Lives in: House/apartment Stairs: No Has following equipment at home: Single point cane  PLOF: Independent  PATIENT  GOALS: To improve RUE movement   OBJECTIVE:   HAND DOMINANCE: Right  ADLs: Overall ADLs: Pt reports that she is able to get dressed and shower with increased time and compensatory strategies, however she is unable to complete grooming, meal prep, cleaning, or other IADL's. Pt has difficulty with grasping and holding objects due to weakness in her hands and difficulty making a full fist.  FUNCTIONAL OUTCOME MEASURES: Upper Extremity Functional Scale (UEFS): 65%  UPPER EXTREMITY ROM:     Active ROM Right eval  Shoulder flexion 85  Shoulder abduction 63  Shoulder internal rotation 90  Shoulder external rotation -2  (Blank rows = not tested)   UPPER EXTREMITY MMT:     MMT Right eval  Shoulder flexion 4+/5  Shoulder abduction 4/5  Shoulder adduction 4/5  Shoulder extension 3-/5  Shoulder internal rotation 5/5  Shoulder external rotation 4-/5  Elbow flexion 5/5  Elbow extension 4/5  (Blank rows = not tested)  HAND FUNCTION: Grip strength: Right: 30 lbs; Left: 35 lbs, Lateral pinch: Right: 5 lbs, Left: 10 lbs, and 3 point pinch: Right: 5 lbs, Left: 7 lbs  SENSATION: WFL  EDEMA: No edema noted  COGNITION: Overall cognitive status: No family/caregiver present to determine baseline cognitive functioning Areas of impairment: Areas of impairment: Attention, Memory, Awareness, and Problem solving  Attention: Deficits Difficulty focusing on conversations, needs repetition Memory: Deficits Difficulty recalling events prior to coming to therapy Problem solving: Deficits Requires increased time and cuing  OBSERVATIONS: Unable to make a full fist in either hand. RUE 85% of a full fist, LUE 50% of a full fist   TODAY'S TREATMENT:                                                                                                                              DATE: 07/07/22: Evaluation Only    PATIENT EDUCATION: Education details: Will Start on first session Person educated:  Patient Education method: Explanation Education comprehension: verbalized understanding  HOME EXERCISE PROGRAM: Will Start on first session  GOALS: Goals reviewed with patient? Yes  SHORT TERM GOALS: Target date: 08/26/22  Pt will be provided with and educated on a HEP to improve mobility in RUE required for ADL completion. Goal status: INITIAL  2.  Pt will decrease pain in RUE to 3/10 or less in order to complete daily grooming tasks without taking a break due to pain.  Goal status: INITIAL  3.  Pt will decrease RUE fascial restrictions to minimal amounts or less to improve mobility required for overhead reaching tasks. Goal status: INITIAL  4.  Pt will increase A/ROM of RUE by 30 degrees or too WFL to improve ability to reach overhead and behind her back during dressing and bathing tasks.  Goal status: INITIAL  5.  Pt will increase strength in RUE to 4+/5 to improve ability to perform lifting tasks required during cooking and cleaning.  Goal status: INITIAL  6.  Pt will increase ROM in right digits to improve ability to form a full grasp required for holding items.  Goal status: INITIAL   ASSESSMENT:  CLINICAL IMPRESSION: Patient is a 57 y.o. female who was seen today for occupational therapy evaluation for limited ROM in her RUE, as well as pain with ROM. She is unable to complete any IADL's without lots of pain and dropping items due to weakness, as well as being limited in all ADL's, requiring increased time and maximum compensatory strategies.    PERFORMANCE DEFICITS: in functional skills including ADLs, IADLs, coordination, ROM, strength, pain, fascial restrictions, Fine motor control, Gross motor control, body mechanics, and UE functional use, cognitive skills including attention, memory, problem solving, and safety awareness, and psychosocial skills including coping strategies and environmental adaptation.   IMPAIRMENTS: are limiting patient from ADLs, IADLs, rest and  sleep, leisure, and social participation.   COMORBIDITIES: may have co-morbidities  that affects occupational performance. Patient will benefit from skilled OT to address above impairments and improve overall function.  MODIFICATION OR ASSISTANCE TO COMPLETE EVALUATION: No modification of tasks or assist necessary to complete an evaluation.  OT OCCUPATIONAL PROFILE AND HISTORY: Problem focused assessment: Including review of records relating to presenting problem.  CLINICAL DECISION MAKING: LOW - limited treatment options, no task modification necessary  REHAB POTENTIAL: Good  EVALUATION COMPLEXITY: Low      PLAN:  OT FREQUENCY: 2x/week  OT DURATION: 6 weeks  PLANNED INTERVENTIONS: self care/ADL training, therapeutic exercise, therapeutic activity, manual therapy, passive range of motion, functional mobility training, electrical stimulation, ultrasound, moist heat, cryotherapy, patient/family education, coping strategies training, and DME and/or AE instructions  RECOMMENDED OTHER SERVICES: N/A  CONSULTED AND AGREED WITH PLAN OF CARE: Patient  PLAN FOR NEXT SESSION: Manual Therapy, P/ROM, AA/ROM, [facility], [place], OTR/L [facility] [place], [place] 07/07/2022, 2:43 PM

## 2022-07-08 ENCOUNTER — Inpatient Hospital Stay (HOSPITAL_BASED_OUTPATIENT_CLINIC_OR_DEPARTMENT_OTHER): Payer: Medicaid Other | Admitting: Adult Health

## 2022-07-08 ENCOUNTER — Encounter: Payer: Self-pay | Admitting: Adult Health

## 2022-07-08 ENCOUNTER — Other Ambulatory Visit: Payer: Self-pay

## 2022-07-08 VITALS — BP 134/72 | HR 60 | Temp 97.5°F | Resp 15 | Wt 125.1 lb

## 2022-07-08 DIAGNOSIS — C50212 Malignant neoplasm of upper-inner quadrant of left female breast: Secondary | ICD-10-CM | POA: Diagnosis not present

## 2022-07-08 DIAGNOSIS — Z9189 Other specified personal risk factors, not elsewhere classified: Secondary | ICD-10-CM

## 2022-07-08 DIAGNOSIS — R911 Solitary pulmonary nodule: Secondary | ICD-10-CM

## 2022-07-08 DIAGNOSIS — K769 Liver disease, unspecified: Secondary | ICD-10-CM | POA: Diagnosis not present

## 2022-07-08 DIAGNOSIS — Z171 Estrogen receptor negative status [ER-]: Secondary | ICD-10-CM | POA: Diagnosis not present

## 2022-07-08 DIAGNOSIS — M79602 Pain in left arm: Secondary | ICD-10-CM | POA: Diagnosis not present

## 2022-07-08 NOTE — Assessment & Plan Note (Addendum)
Sharon Fischer is a 57 year old woman with history of stage IIIb triple negative breast cancer diagnosed in October 2021 status post neoadjuvant chemotherapy which she was only able to tolerate 4 cycles due to side effects.  She underwent bilateral mastectomies on December 16, 2020 which revealed HER2 positivity and she subsequently received a few months of Herceptin therapy adjuvantly.  Sheronica's CT scan does not show any definitive evidence for metastatic disease of her breast cancer in the chest abdomen or pelvis.  We did review that there were some findings that needed follow-up such as the prominent soft tissue in the mediastinum and the loculated pleural effusion therefore we will repeat CT chest with contrast in 6 months.    Because the liver lesion was indeterminate and she has experienced unintentional weight loss we will get an MRI of the abdomen to further evaluate this area in the liver to ensure there is no metastatic disease.  I recommended Catrena follow-up with her primary care about her thyroid abnormality.  Letty will continue to follow-up with Dr. Lindi Adie in April as scheduled and I will see her a few weeks after she undergoes her CT chest that is due at the end of July.

## 2022-07-08 NOTE — Progress Notes (Signed)
[facility] up:    Sharon Fischer., PA-C [facility] [address] [place] [address]   DIAGNOSIS:  Cancer Staging  Malignant neoplasm of upper-inner quadrant of left breast in female, estrogen receptor negative ([place]) Staging form: Breast, AJCC 8th Edition - Clinical stage from 03/31/2020: Stage IIIB (cT2, cN1(f), cM0, G3, ER-, PR-, HER2-) - Signed by Nicholas Lose, MD on 04/02/2020 Stage prefix: Initial diagnosis Method of lymph node assessment: Core biopsy Histologic grading system: 3 grade system   SUMMARY OF ONCOLOGIC HISTORY: Oncology History  Malignant neoplasm of upper-inner quadrant of left breast in female, estrogen receptor negative ([place])  01/2020 Initial Diagnosis   Palpable right breast mass growing since July 2021.  Mammogram revealed 2.2 cm right breast mass with a 0.8 cm satellite lesion.  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   03/31/2020 Cancer Staging   Staging form: Breast, AJCC 8th Edition - Clinical stage from 03/31/2020: Stage IIIB (cT2, cN1(f), cM0, G3, ER-, PR-, HER2-) - Signed by Nicholas Lose, MD on 04/02/2020   04/09/2020 - 08/06/2020 Chemotherapy    Patient is on Treatment Plan: BREAST AC Q21D / CARBOPLATIN D1 + PACLITAXEL D1,8,15 Q21D, Keytruda discontinued for pneumonitis and Taxol discontinued after 4 cycles because of failure to thrive       12/16/2020 Surgery   Bilateral mastectomies   Left mastectomy: Intraductal papilloma, UDH Right mastectomy: No residual carcinoma was 1/3 lymph nodes positive ER negative, PR +20%, HER2 2+ by IHC, positive by FISH ratio 2.03   02/05/2021 - 05/21/2021 Chemotherapy   Patient is on Treatment Plan : BREAST Trastuzumab q21d       CURRENT THERAPY: Observation  INTERVAL HISTORY: Sharon Fischer 57 y.o. female returns for follow-up of her estrogen negative breast cancer  after undergoing a CT chest abdomen and pelvis due to her weight loss and fatigue.  He underwent CT scan on July 06, 2022 which indicated pleural thickening and or a loculated pleural effusion in the medial aspect of the right hemithorax favored to be benign but follow-up was recommended.  Subtle poorly defined hypervascular area in the periphery of the right lobe of the liver where cancer could not be excluded and MRI of the liver could be considered for further evaluation, prominent soft tissue in the anterior mediastinum benign in appearance, aortic atherosclerosis, and cholelithiasis without evidence of acute cholecystitis.  Sharon Fischer notes that she is following up with her primary care provider about her abnormal thyroid studies.  Her weight has been stable since we last saw her.   Patient Active Problem List   Diagnosis Date Noted   Encounter for gynecological examination with Papanicolaou smear of cervix 12/09/2021   History of breast cancer 12/09/2021   Generalized body aches 12/09/2021   Encounter for screening fecal occult blood testing 12/09/2021   Routine cervical smear 12/09/2021   Hematuria 12/09/2021   Vaginal atrophy 12/09/2021   Dyspnea on exertion 04/27/2021   Hypertension 04/27/2021   Smoker 04/27/2021   Mixed hyperlipidemia 04/27/2021   Breast cancer ([place]) 12/16/2020   Port-A-Cath in place 05/20/2020   Family history of throat cancer    Malignant neoplasm of upper-inner quadrant of left breast in female, estrogen receptor negative ([place]) 03/31/2020    has No Known Allergies.  MEDICAL HISTORY: Past Medical History:  Diagnosis Date   Breast cancer ([place]) 03/2020  Family history of throat cancer    High cholesterol    Hypertension     SURGICAL HISTORY: Past Surgical History:  Procedure Laterality Date   AXILLARY SENTINEL NODE BIOPSY Right 12/16/2020   Procedure: TARGETED RIGHT SENTINEL LYMPH NODE DISSECTION;  Surgeon: Jovita Kussmaul, MD;  Location: [facility];   Service: General;  Laterality: Right;   BREAST CYST EXCISION Left 12/16/2020   Procedure: CYST EXCISION LEFT AXILLA;  Surgeon: Jovita Kussmaul, MD;  Location: [facility];  Service: General;  Laterality: Left;   BREAST RECONSTRUCTION WITH PLACEMENT OF TISSUE EXPANDER AND FLEX HD (ACELLULAR HYDRATED DERMIS) Bilateral 12/16/2020   Procedure: IMMEDIATE BILATERAL BREAST RECONSTRUCTION WITH PLACEMENT OF TISSUE EXPANDER AND FLEX HD (ACELLULAR HYDRATED DERMIS);  Surgeon: Wallace Going, DO;  Location: [facility];  Service: Plastics;  Laterality: Bilateral;   CESAREAN SECTION     x 2   IR IMAGING GUIDED PORT INSERTION  03/30/2020   PORT-A-CATH REMOVAL Right 12/16/2020   Procedure: REMOVAL PORT-A-CATH;  Surgeon: Jovita Kussmaul, MD;  Location: [facility];  Service: General;  Laterality: Right;   REMOVAL OF BILATERAL TISSUE EXPANDERS WITH PLACEMENT OF BILATERAL BREAST IMPLANTS Bilateral 02/22/2021   Procedure: Removal of bilateral breast expanders without implant placement;  Surgeon: Wallace Going, DO;  Location: [facility];  Service: Plastics;  Laterality: Bilateral;   TOTAL MASTECTOMY Bilateral 12/16/2020   Procedure: TOTAL MASTECTOMY;  Surgeon: Jovita Kussmaul, MD;  Location: [facility];  Service: General;  Laterality: Bilateral;    SOCIAL HISTORY: Social History   Socioeconomic History   Marital status: Single    Spouse name: Not on file   Number of children: Not on file   Years of education: Not on file   Highest education level: Not on file  Occupational History   Not on file  Tobacco Use   Smoking status: Former    Packs/day: 0.25    Years: 25.00    Total pack years: 6.25    Types: Cigarettes   Smokeless tobacco: Never   Tobacco comments:    7-8 cigs /day  Vaping Use   Vaping Use: Never used  Substance and Sexual Activity   Alcohol use: Not Currently   Drug use: Never   Sexual activity: Not Currently    Birth control/protection: Post-menopausal  Other Topics Concern   Not  on file  Social History Narrative   Not on file   Social Determinants of Health   Financial Resource Strain: Low Risk  (12/09/2021)   Overall Financial Resource Strain (CARDIA)    Difficulty of Paying Living Expenses: Not hard at all  Food Insecurity: No Food Insecurity (12/09/2021)   Hunger Vital Sign    Worried About Running Out of Food in the Last Year: Never true    Ran Out of Food in the Last Year: Never true  Transportation Needs: No Transportation Needs (12/09/2021)   PRAPARE - Hydrologist (Medical): No    Lack of Transportation (Non-Medical): No  Physical Activity: Insufficiently Active (12/09/2021)   Exercise Vital Sign    Days of Exercise per Week: 2 days    Minutes of Exercise per Session: 20 min  Stress: No Stress Concern Present (12/09/2021)   [place]    Feeling of Stress : Not at all  Social Connections: Socially Isolated (12/09/2021)   Social Connection and Isolation Panel [NHANES]    Frequency of Communication with Friends and  Family: Three times a week    Frequency of Social Gatherings with Friends and Family: Three times a week    Attends Religious Services: Never    Active Member of Clubs or Organizations: No    Attends Archivist Meetings: Never    Marital Status: Never married  Intimate Partner Violence: Not At Risk (12/09/2021)   Humiliation, Afraid, Rape, and Kick questionnaire    Fear of Current or Ex-Partner: No    Emotionally Abused: No    Physically Abused: No    Sexually Abused: No    FAMILY HISTORY: Family History  Problem Relation Age of Onset   Throat cancer Maternal Uncle        dx >50, smoker   Throat cancer Maternal Uncle        dx >50, smoker   Cancer Cousin        unknown type, dx >50 (paternal first cousin)   Cancer Cousin        unknown type (paternal first cousin)    Review of Systems  Constitutional:  Positive for fatigue.  Negative for appetite change, chills, fever and unexpected weight change.  HENT:   Negative for hearing loss, lump/mass and trouble swallowing.   Eyes:  Negative for eye problems and icterus.  Respiratory:  Negative for chest tightness, cough and shortness of breath.   Cardiovascular:  Negative for chest pain, leg swelling and palpitations.  Gastrointestinal:  Negative for abdominal distention, abdominal pain, constipation, diarrhea, nausea and vomiting.  Endocrine: Negative for hot flashes.  Genitourinary:  Negative for difficulty urinating.   Musculoskeletal:  Negative for arthralgias.  Skin:  Negative for itching and rash.  Neurological:  Negative for dizziness, extremity weakness, headaches and numbness.  Hematological:  Negative for adenopathy. Does not bruise/bleed easily.  Psychiatric/Behavioral:  Negative for depression. The patient is not nervous/anxious.       PHYSICAL EXAMINATION  ECOG PERFORMANCE STATUS: 1 - Symptomatic but completely ambulatory  Vitals:   07/08/22 1511  BP: 134/72  Pulse: 60  Resp: 15  Temp: (!) 97.5 F (36.4 C)  SpO2: 100%    Physical Exam Constitutional:      General: She is not in acute distress.    Appearance: Normal appearance. She is not toxic-appearing.  HENT:     Head: Normocephalic and atraumatic.  Eyes:     General: No scleral icterus. Cardiovascular:     Rate and Rhythm: Normal rate and regular rhythm.     Pulses: Normal pulses.     Heart sounds: Normal heart sounds.  Pulmonary:     Effort: Pulmonary effort is normal.     Breath sounds: Normal breath sounds.  Abdominal:     General: Abdomen is flat. Bowel sounds are normal. There is no distension.     Palpations: Abdomen is soft.     Tenderness: There is no abdominal tenderness.  Musculoskeletal:        General: No swelling.     Cervical back: Neck supple.  Lymphadenopathy:     Cervical: No cervical adenopathy.  Skin:    General: Skin is warm and dry.     Findings: No  rash.  Neurological:     General: No focal deficit present.     Mental Status: She is alert.  Psychiatric:        Mood and Affect: Mood normal.        Behavior: Behavior normal.     LABORATORY DATA: None for this visit   ASSESSMENT  and THERAPY PLAN:   Malignant neoplasm of upper-inner quadrant of left breast in female, estrogen receptor negative ([place]) Sharon Fischer is a 57 year old woman with history of stage IIIb triple negative breast cancer diagnosed in October 2021 status post neoadjuvant chemotherapy which she was only able to tolerate 4 cycles due to side effects.  She underwent bilateral mastectomies on December 16, 2020 which revealed HER2 positivity and she subsequently received a few months of Herceptin therapy adjuvantly.  Sharon Fischer's CT scan does not show any definitive evidence for metastatic disease of her breast cancer in the chest abdomen or pelvis.  We did review that there were some findings that needed follow-up such as the prominent soft tissue in the mediastinum and the loculated pleural effusion therefore we will repeat CT chest with contrast in 6 months.    Because the liver lesion was indeterminate and she has experienced unintentional weight loss we will get an MRI of the abdomen to further evaluate this area in the liver to ensure there is no metastatic disease.  I recommended Aaleyah follow-up with her primary care about her thyroid abnormality.  Aleiya will continue to follow-up with Dr. Lindi Adie in April as scheduled and I will see her a few weeks after she undergoes her CT chest that is due at the end of July.   All questions were answered. The patient knows to call the clinic with any problems, questions or concerns. We can certainly see the patient much sooner if necessary.  Total encounter time:20 minutes*in face-to-face visit time, chart review, lab review, care coordination, order entry, and documentation of the encounter time.  Wilber Bihari, NP 07/08/22 4:50  PM Medical Oncology and Hematology [facility] [place], [place] [address] Tel. [phone]    Fax. [phone]  *Total Encounter Time as defined by the Centers for Medicare and Medicaid Services includes, in addition to the face-to-face time of a patient visit (documented in the note above) non-face-to-face time: obtaining and reviewing outside history, ordering and reviewing medications, tests or procedures, care coordination (communications with other health care professionals or caregivers) and documentation in the medical record.

## 2022-07-12 ENCOUNTER — Ambulatory Visit (HOSPITAL_COMMUNITY): Payer: Medicaid Other | Admitting: Occupational Therapy

## 2022-07-13 ENCOUNTER — Telehealth: Payer: Self-pay

## 2022-07-13 ENCOUNTER — Telehealth: Payer: Self-pay | Admitting: Adult Health

## 2022-07-13 NOTE — Telephone Encounter (Signed)
Returned her call and reviewed upcoming appts.

## 2022-07-13 NOTE — Telephone Encounter (Signed)
Scheduled appointment per 1/26 los. Left voicemail.

## 2022-07-15 ENCOUNTER — Encounter (HOSPITAL_COMMUNITY): Payer: Self-pay | Admitting: Occupational Therapy

## 2022-07-15 ENCOUNTER — Ambulatory Visit (HOSPITAL_COMMUNITY): Payer: Medicare Other | Attending: Hematology and Oncology | Admitting: Occupational Therapy

## 2022-07-15 DIAGNOSIS — M25511 Pain in right shoulder: Secondary | ICD-10-CM | POA: Diagnosis present

## 2022-07-15 DIAGNOSIS — M25611 Stiffness of right shoulder, not elsewhere classified: Secondary | ICD-10-CM | POA: Insufficient documentation

## 2022-07-15 DIAGNOSIS — R29898 Other symptoms and signs involving the musculoskeletal system: Secondary | ICD-10-CM | POA: Diagnosis present

## 2022-07-15 DIAGNOSIS — G8929 Other chronic pain: Secondary | ICD-10-CM | POA: Insufficient documentation

## 2022-07-15 NOTE — Therapy (Signed)
OUTPATIENT OCCUPATIONAL THERAPY ORTHO TREATMENT  Patient Name: Sharon [place] MRN: [id number] DOB:[birth year], 57 y.o., female Today's Date: 07/15/2022  PCP: Raiford Simmonds., PA-C REFERRING PROVIDER: Nicholas Lose, MD  END OF SESSION:  OT End of Session - 07/15/22 1108     Visit Number 2    Number of Visits 13    Date for OT Re-Evaluation 08/26/22    Authorization Type Medicaid [employer]    Progress Note Due on Visit 10    OT Start Time 1027    OT Stop Time 1108    OT Time Calculation (min) 41 min    Activity Tolerance Patient tolerated treatment well    Behavior During Therapy [facility] for tasks assessed/performed;Flat affect              Past Medical History:  Diagnosis Date   Breast cancer ([place]) 03/2020   Family history of throat cancer    High cholesterol    Hypertension    Past Surgical History:  Procedure Laterality Date   AXILLARY SENTINEL NODE BIOPSY Right 12/16/2020   Procedure: TARGETED RIGHT SENTINEL LYMPH NODE DISSECTION;  Surgeon: Jovita Kussmaul, MD;  Location: [place];  Service: General;  Laterality: Right;   BREAST CYST EXCISION Left 12/16/2020   Procedure: CYST EXCISION LEFT AXILLA;  Surgeon: Jovita Kussmaul, MD;  Location: [place];  Service: General;  Laterality: Left;   BREAST RECONSTRUCTION WITH PLACEMENT OF TISSUE EXPANDER AND FLEX HD (ACELLULAR HYDRATED DERMIS) Bilateral 12/16/2020   Procedure: IMMEDIATE BILATERAL BREAST RECONSTRUCTION WITH PLACEMENT OF TISSUE EXPANDER AND FLEX HD (ACELLULAR HYDRATED DERMIS);  Surgeon: Wallace Going, DO;  Location: [place];  Service: Plastics;  Laterality: Bilateral;   CESAREAN SECTION     x 2   IR IMAGING GUIDED PORT INSERTION  03/30/2020   PORT-A-CATH REMOVAL Right 12/16/2020   Procedure: REMOVAL PORT-A-CATH;  Surgeon: Jovita Kussmaul, MD;  Location: [place];  Service: General;  Laterality: Right;   REMOVAL OF BILATERAL TISSUE EXPANDERS WITH PLACEMENT OF BILATERAL BREAST IMPLANTS Bilateral 02/22/2021   Procedure:  Removal of bilateral breast expanders without implant placement;  Surgeon: Wallace Going, DO;  Location: [place];  Service: Plastics;  Laterality: Bilateral;   TOTAL MASTECTOMY Bilateral 12/16/2020   Procedure: TOTAL MASTECTOMY;  Surgeon: Jovita Kussmaul, MD;  Location: [place];  Service: General;  Laterality: Bilateral;   Patient Active Problem List   Diagnosis Date Noted   Encounter for gynecological examination with Papanicolaou smear of cervix 12/09/2021   History of breast cancer 12/09/2021   Generalized body aches 12/09/2021   Encounter for screening fecal occult blood testing 12/09/2021   Routine cervical smear 12/09/2021   Hematuria 12/09/2021   Vaginal atrophy 12/09/2021   Dyspnea on exertion 04/27/2021   Hypertension 04/27/2021   Smoker 04/27/2021   Mixed hyperlipidemia 04/27/2021   Breast cancer ([place]) 12/16/2020   Port-A-Cath in place 05/20/2020   Family history of throat cancer    Malignant neoplasm of upper-inner quadrant of left breast in female, estrogen receptor negative ([place]) 03/31/2020    ONSET DATE: 2021  REFERRING DIAG: Limited ROM of RUE  THERAPY DIAG:  Chronic right shoulder pain  Shoulder stiffness, right  Rationale for Evaluation and Treatment: Rehabilitation  SUBJECTIVE:   SUBJECTIVE STATEMENT: "My hand has been hurting." Pt accompanied by: self  PERTINENT HISTORY: Pt was diagnosed with breast cancer in 2021. Pt underwent chemo, radiation, and double mastectomy. Since the radiation, pt reports that she has had difficulty lifting  her RUE.  PRECAUTIONS: None  WEIGHT BEARING RESTRICTIONS: No  PAIN:  Are you having pain? Yes: NPRS scale: 3/10 Pain location: R hand Pain description: aching  Aggravating factors: hurting while resting and durin activity Relieving factors: rest  FALLS: Has patient fallen in last 6 months? No  LIVING ENVIRONMENT: Lives with: lives with their family Lives in: House/apartment Stairs:  No Has following equipment at home: Single point cane  PLOF: Independent  PATIENT GOALS: To improve RUE movement   OBJECTIVE:   HAND DOMINANCE: Right  ADLs: Overall ADLs: Pt reports that she is able to get dressed and shower with increased time and compensatory strategies, however she is unable to complete grooming, meal prep, cleaning, or other IADL's. Pt has difficulty with grasping and holding objects due to weakness in her hands and difficulty making a full fist.  FUNCTIONAL OUTCOME MEASURES: Upper Extremity Functional Scale (UEFS): 65%  UPPER EXTREMITY ROM:     Active ROM Right eval  Shoulder flexion 85  Shoulder abduction 63  Shoulder internal rotation 90  Shoulder external rotation -2  (Blank rows = not tested)   UPPER EXTREMITY MMT:     MMT Right eval  Shoulder flexion 4+/5  Shoulder abduction 4/5  Shoulder adduction 4/5  Shoulder extension 3-/5  Shoulder internal rotation 5/5  Shoulder external rotation 4-/5  Elbow flexion 5/5  Elbow extension 4/5  (Blank rows = not tested)  HAND FUNCTION: Grip strength: Right: 30 lbs; Left: 35 lbs, Lateral pinch: Right: 5 lbs, Left: 10 lbs, and 3 point pinch: Right: 5 lbs, Left: 7 lbs  SENSATION: WFL  EDEMA: No edema noted  COGNITION: Overall cognitive status: No family/caregiver present to determine baseline cognitive functioning Areas of impairment: Areas of impairment: Attention, Memory, Awareness, and Problem solving  Attention: Deficits Difficulty focusing on conversations, needs repetition Memory: Deficits Difficulty recalling events prior to coming to therapy Problem solving: Deficits Requires increased time and cuing  OBSERVATIONS: Unable to make a full fist in either hand. RUE 85% of a full fist, LUE 50% of a full fist   TODAY'S TREATMENT:                                                                                                                              DATE: 07/07/22: Evaluation Only    07/15/2022  - Manual: myofascial release to upper deltoid (tight), biceps, trapezius to decrease fascial restrictions and pain and increase ROM  - P/ROM: shoulder; flexion, abduction, horizontal abduction, internal/external rotation 10x each direction  - AA/ROM:  shoulder; flexion, abduction, horizontal abduction, internal/external rotation 10x each direction (achieving 50% ROM with flexion/abduction, less than 25% ROM external rotation)  - Isometrics: standing facing wall holding wash cloth with fist pushing into wall for flexion, abduction, internal/external rotation holding 10 seconds 2 reps each way  - Wall Slides: with arms bent on wall sliding as high into flexion (50%) for 5 reps     PATIENT EDUCATION:  Education details: Will Start on first session Person educated: Patient Education method: Explanation Education comprehension: verbalized understanding  HOME EXERCISE PROGRAM: Will Start on first session  GOALS: Goals reviewed with patient? Yes  SHORT TERM GOALS: Target date: 08/26/22  Pt will be provided with and educated on a HEP to improve mobility in RUE required for ADL completion. Goal status: INITIAL  2.  Pt will decrease pain in RUE to 3/10 or less in order to complete daily grooming tasks without taking a break due to pain.  Goal status: INITIAL  3.  Pt will decrease RUE fascial restrictions to minimal amounts or less to improve mobility required for overhead reaching tasks. Goal status: INITIAL  4.  Pt will increase A/ROM of RUE by 30 degrees or too WFL to improve ability to reach overhead and behind her back during dressing and bathing tasks.  Goal status: INITIAL  5.  Pt will increase strength in RUE to 4+/5 to improve ability to perform lifting tasks required during cooking and cleaning.  Goal status: INITIAL  6.  Pt will increase ROM in right digits to improve ability to form a full grasp required for holding items.  Goal status:  INITIAL   ASSESSMENT:  CLINICAL IMPRESSION: Pt reports havin R hand pain this session, on palm of hand specifically. When completing P/ROM, pt demonstrating difficulty relaxing for OT to complete passive ROM. Pt demonstrating 50% shoulder flexion/abduction and less than 25% internal rotation with shoulder AA/ROM. Added isometric strengthening exercises standing at wall this session. Gave handout for table slides to complete at home, provided demonstration of exercises and pt verbalized understanding. Pt requiring frequent VC and tactile cueing for form and repeated verbal directions throughout session.   PERFORMANCE DEFICITS: in functional skills including ADLs, IADLs, coordination, ROM, strength, pain, fascial restrictions, Fine motor control, Gross motor control, body mechanics, and UE functional use, cognitive skills including attention, memory, problem solving, and safety awareness, and psychosocial skills including coping strategies and environmental adaptation.   IMPAIRMENTS: are limiting patient from ADLs, IADLs, rest and sleep, leisure, and social participation.   COMORBIDITIES: may have co-morbidities  that affects occupational performance. Patient will benefit from skilled OT to address above impairments and improve overall function.  MODIFICATION OR ASSISTANCE TO COMPLETE EVALUATION: No modification of tasks or assist necessary to complete an evaluation.  OT OCCUPATIONAL PROFILE AND HISTORY: Problem focused assessment: Including review of records relating to presenting problem.  CLINICAL DECISION MAKING: LOW - limited treatment options, no task modification necessary  REHAB POTENTIAL: Good  EVALUATION COMPLEXITY: Low      PLAN:  OT FREQUENCY: 2x/week  OT DURATION: 6 weeks  PLANNED INTERVENTIONS: self care/ADL training, therapeutic exercise, therapeutic activity, manual therapy, passive range of motion, functional mobility training, electrical stimulation, ultrasound,  moist heat, cryotherapy, patient/family education, coping strategies training, and DME and/or AE instructions  RECOMMENDED OTHER SERVICES: N/A  CONSULTED AND AGREED WITH PLAN OF CARE: Patient  PLAN FOR NEXT SESSION: Manual Therapy, P/ROM, AA/ROM, Isometrics, Wall Slides   Arvil Persons, OTR/L [facility] [phone]

## 2022-07-15 NOTE — Patient Instructions (Signed)
1) SHOULDER: Flexion On Table   Place hands on towel placed on table, elbows straight. Lean forward with you upper body, pushing towel away from body.  _10__ reps per set, __2_ sets per day  2) Abduction (Passive)   With arm out to side, resting on towel placed on table with palm DOWN, keeping trunk away from table, lean to the side while pushing towel away from body.  Repeat __10__ times. Do _2___ sessions per day.  Copyright  VHI. All rights reserved.       Copyright  VHI. All rights reserved.

## 2022-07-19 ENCOUNTER — Encounter (HOSPITAL_COMMUNITY): Payer: Self-pay | Admitting: Occupational Therapy

## 2022-07-19 ENCOUNTER — Ambulatory Visit (HOSPITAL_COMMUNITY): Payer: Medicare Other | Admitting: Occupational Therapy

## 2022-07-19 DIAGNOSIS — R29898 Other symptoms and signs involving the musculoskeletal system: Secondary | ICD-10-CM

## 2022-07-19 DIAGNOSIS — G8929 Other chronic pain: Secondary | ICD-10-CM

## 2022-07-19 DIAGNOSIS — M25511 Pain in right shoulder: Secondary | ICD-10-CM | POA: Diagnosis not present

## 2022-07-19 DIAGNOSIS — M25611 Stiffness of right shoulder, not elsewhere classified: Secondary | ICD-10-CM

## 2022-07-19 NOTE — Therapy (Signed)
OUTPATIENT OCCUPATIONAL THERAPY ORTHO TREATMENT  Patient Name: Sharon [place] MRN: [id number] DOB:September 17, 1965, 57 y.o., female Today's Date: 07/19/2022  PCP: Raiford Simmonds., PA-C REFERRING PROVIDER: Nicholas Lose, MD  END OF SESSION:  OT End of Session - 07/19/22 1107     Visit Number 3    Number of Visits 13    Date for OT Re-Evaluation 08/26/22    Authorization Type Medicaid [employer]    Authorization Time Period 7 visits approved 1/25-3/24/24    Authorization - Visit Number 2    Authorization - Number of Visits 7    Progress Note Due on Visit 10    OT Start Time 1028    OT Stop Time 1106    OT Time Calculation (min) 38 min    Activity Tolerance Patient tolerated treatment well    Behavior During Therapy [facility] for tasks assessed/performed;Flat affect               Past Medical History:  Diagnosis Date   Breast cancer ([place]) 03/2020   Family history of throat cancer    High cholesterol    Hypertension    Past Surgical History:  Procedure Laterality Date   AXILLARY SENTINEL NODE BIOPSY Right 12/16/2020   Procedure: TARGETED RIGHT SENTINEL LYMPH NODE DISSECTION;  Surgeon: Jovita Kussmaul, MD;  Location: [place];  Service: General;  Laterality: Right;   BREAST CYST EXCISION Left 12/16/2020   Procedure: CYST EXCISION LEFT AXILLA;  Surgeon: Jovita Kussmaul, MD;  Location: [place];  Service: General;  Laterality: Left;   BREAST RECONSTRUCTION WITH PLACEMENT OF TISSUE EXPANDER AND FLEX HD (ACELLULAR HYDRATED DERMIS) Bilateral 12/16/2020   Procedure: IMMEDIATE BILATERAL BREAST RECONSTRUCTION WITH PLACEMENT OF TISSUE EXPANDER AND FLEX HD (ACELLULAR HYDRATED DERMIS);  Surgeon: Wallace Going, DO;  Location: [place];  Service: Plastics;  Laterality: Bilateral;   CESAREAN SECTION     x 2   IR IMAGING GUIDED PORT INSERTION  03/30/2020   PORT-A-CATH REMOVAL Right 12/16/2020   Procedure: REMOVAL PORT-A-CATH;  Surgeon: Jovita Kussmaul, MD;  Location: [place];  Service: General;   Laterality: Right;   REMOVAL OF BILATERAL TISSUE EXPANDERS WITH PLACEMENT OF BILATERAL BREAST IMPLANTS Bilateral 02/22/2021   Procedure: Removal of bilateral breast expanders without implant placement;  Surgeon: Wallace Going, DO;  Location: [place];  Service: Plastics;  Laterality: Bilateral;   TOTAL MASTECTOMY Bilateral 12/16/2020   Procedure: TOTAL MASTECTOMY;  Surgeon: Jovita Kussmaul, MD;  Location: [place];  Service: General;  Laterality: Bilateral;   Patient Active Problem List   Diagnosis Date Noted   Encounter for gynecological examination with Papanicolaou smear of cervix 12/09/2021   History of breast cancer 12/09/2021   Generalized body aches 12/09/2021   Encounter for screening fecal occult blood testing 12/09/2021   Routine cervical smear 12/09/2021   Hematuria 12/09/2021   Vaginal atrophy 12/09/2021   Dyspnea on exertion 04/27/2021   Hypertension 04/27/2021   Smoker 04/27/2021   Mixed hyperlipidemia 04/27/2021   Breast cancer ([place]) 12/16/2020   Port-A-Cath in place 05/20/2020   Family history of throat cancer    Malignant neoplasm of upper-inner quadrant of left breast in female, estrogen receptor negative ([place]) 03/31/2020    ONSET DATE: 2021  REFERRING DIAG: Limited ROM of RUE  THERAPY DIAG:  Chronic right shoulder pain  Shoulder stiffness, right  Other symptoms and signs involving the musculoskeletal system  Rationale for Evaluation and Treatment: Rehabilitation  SUBJECTIVE:   SUBJECTIVE STATEMENT: S: "  I hope I don't ever have pain."  PERTINENT HISTORY: Pt was diagnosed with breast cancer in 2021. Pt underwent chemo, radiation, and double mastectomy. Since the radiation, pt reports that she has had difficulty lifting her RUE.  PRECAUTIONS: None  WEIGHT BEARING RESTRICTIONS: No  PAIN:  Are you having pain? No  FALLS: Has patient fallen in last 6 months? No   PATIENT GOALS: To improve RUE movement   OBJECTIVE:   HAND  DOMINANCE: Right  ADLs: Overall ADLs: Pt reports that she is able to get dressed and shower with increased time and compensatory strategies, however she is unable to complete grooming, meal prep, cleaning, or other IADL's. Pt has difficulty with grasping and holding objects due to weakness in her hands and difficulty making a full fist.  FUNCTIONAL OUTCOME MEASURES: Upper Extremity Functional Scale (UEFS): 65%  UPPER EXTREMITY ROM:     Active ROM Right eval  Shoulder flexion 85  Shoulder abduction 63  Shoulder internal rotation 90  Shoulder external rotation -2  (Blank rows = not tested)   UPPER EXTREMITY MMT:     MMT Right eval  Shoulder flexion 4+/5  Shoulder abduction 4/5  Shoulder adduction 4/5  Shoulder extension 3-/5  Shoulder internal rotation 5/5  Shoulder external rotation 4-/5  Elbow flexion 5/5  Elbow extension 4/5  (Blank rows = not tested)  HAND FUNCTION: Grip strength: Right: 30 lbs; Left: 35 lbs, Lateral pinch: Right: 5 lbs, Left: 10 lbs, and 3 point pinch: Right: 5 lbs, Left: 7 lbs   COGNITION: Overall cognitive status: No family/caregiver present to determine baseline cognitive functioning Areas of impairment: Areas of impairment: Attention, Memory, Awareness, and Problem solving  Attention: Deficits Difficulty focusing on conversations, needs repetition Memory: Deficits Difficulty recalling events prior to coming to therapy Problem solving: Deficits Requires increased time and cuing  OBSERVATIONS: Unable to make a full fist in either hand. RUE 85% of a full fist, LUE 50% of a full fist   TODAY'S TREATMENT:                                                                                                                              DATE:  07/19/22 -AA/ROM: supine-shoulder; flexion, abduction, horizontal abduction, internal/external rotation 10x each direction (achieving 50% ROM with flexion/abduction, less than 25% ROM external rotation)  -Wall wash:  1' flexion -AA/ROM: seated-shoulder; flexion, abduction, horizontal abduction, internal/external rotation 10x each direction (achieving 50% ROM with flexion/abduction, less than 25% ROM external rotation)  -Scapular A/ROM: seated-row, extension, scapular elevation/depression, 10 reps each -Anterior glide: 3x10" holds at doorway -Pulleys: 1' flexion, 1' abduction  07/15/2022 - Manual: myofascial release to upper deltoid (tight), biceps, trapezius to decrease fascial restrictions and pain and increase ROM  - P/ROM: shoulder; flexion, abduction, horizontal abduction, internal/external rotation 10x each direction  - AA/ROM:  shoulder; flexion, abduction, horizontal abduction, internal/external rotation 10x each direction (achieving 50% ROM with flexion/abduction, less than 25% ROM external rotation)  -  Isometrics: standing facing wall holding wash cloth with fist pushing into wall for flexion, abduction, internal/external rotation holding 10 seconds 2 reps each way  - Wall Slides: with arms bent on wall sliding as high into flexion (50%) for 5 reps     PATIENT EDUCATION: Education details:  Person educated: Patient Education method: Explanation Education comprehension: verbalized understanding  HOME EXERCISE PROGRAM: 2/2: Table slides 2/6:   GOALS: Goals reviewed with patient? Yes  SHORT TERM GOALS: Target date: 08/26/22  Pt will be provided with and educated on a HEP to improve mobility in RUE required for ADL completion. Goal status: IN PROGRESS  2.  Pt will decrease pain in RUE to 3/10 or less in order to complete daily grooming tasks without taking a break due to pain.  Goal status: IN PROGRESS  3.  Pt will decrease RUE fascial restrictions to minimal amounts or less to improve mobility required for overhead reaching tasks. Goal status: IN PROGRESS  4.  Pt will increase A/ROM of RUE by 30 degrees or too WFL to improve ability to reach overhead and behind her back during dressing  and bathing tasks.  Goal status: IN PROGRESS  5.  Pt will increase strength in RUE to 4+/5 to improve ability to perform lifting tasks required during cooking and cleaning.  Goal status: IN PROGRESS  6.  Pt will increase ROM in right digits to improve ability to form a full grasp required for holding items.  Goal status: IN PROGRESS   ASSESSMENT:  CLINICAL IMPRESSION: Pt reports no pain however during session is pain limited in ROM. Did not complete myofascial release or passive stretching due to patient resistance and guarding, and inability to successfully stretch the RUE. Pt completing AA/ROM with verbal encouragement to maintain BUE extension and perform tasks appropriately. Increased time for all tasks, pt able to achieve approximately 50% ROM with exercises. Reporting pain in upper arm and down her side at times. Modified tasks as needed. Added anterior shoulder glide and pulleys today, pt stretching to approximately 50%, verbal cuing for relaxing RUE against max guarding. Verbal cuing for form and technique.   PERFORMANCE DEFICITS: in functional skills including ADLs, IADLs, coordination, ROM, strength, pain, fascial restrictions, Fine motor control, Gross motor control, body mechanics, and UE functional use, cognitive skills including attention, memory, problem solving, and safety awareness, and psychosocial skills including coping strategies and environmental adaptation.    PLAN:  OT FREQUENCY: 2x/week  OT DURATION: 6 weeks  PLANNED INTERVENTIONS: self care/ADL training, therapeutic exercise, therapeutic activity, manual therapy, passive range of motion, functional mobility training, electrical stimulation, ultrasound, moist heat, cryotherapy, patient/family education, coping strategies training, and DME and/or AE instructions   CONSULTED AND AGREED WITH PLAN OF CARE: Patient  PLAN FOR NEXT SESSION: Manual Therapy prn, P/ROM if able to tolerate, AA/ROM, [place], OTR/L  [phone] 07/19/22

## 2022-07-29 ENCOUNTER — Encounter (HOSPITAL_COMMUNITY): Payer: Self-pay | Admitting: Occupational Therapy

## 2022-07-29 ENCOUNTER — Ambulatory Visit (HOSPITAL_COMMUNITY): Payer: Medicare Other | Admitting: Occupational Therapy

## 2022-07-29 DIAGNOSIS — M25611 Stiffness of right shoulder, not elsewhere classified: Secondary | ICD-10-CM

## 2022-07-29 DIAGNOSIS — R29898 Other symptoms and signs involving the musculoskeletal system: Secondary | ICD-10-CM

## 2022-07-29 DIAGNOSIS — G8929 Other chronic pain: Secondary | ICD-10-CM

## 2022-07-29 DIAGNOSIS — M25511 Pain in right shoulder: Secondary | ICD-10-CM | POA: Diagnosis not present

## 2022-07-29 NOTE — Therapy (Signed)
OUTPATIENT OCCUPATIONAL THERAPY ORTHO TREATMENT  Patient Name: Sharon Fischer MRN: [id number] DOB:March 03, 1966, 57 y.o., female Today's Date: 07/29/2022  PCP: Raiford Simmonds., PA-C REFERRING PROVIDER: Nicholas Lose, MD  END OF SESSION:  OT End of Session - 07/29/22 1117     Visit Number 4    Number of Visits 13    Date for OT Re-Evaluation 08/26/22    Authorization Type Medicaid [employer]    Authorization Time Period 7 visits approved 1/25-3/24/24    Authorization - Visit Number 3    Authorization - Number of Visits 7    Progress Note Due on Visit 10    OT Start Time 1030    OT Stop Time 1108    OT Time Calculation (min) 38 min    Activity Tolerance Patient tolerated treatment well    Behavior During Therapy [facility] for tasks assessed/performed;Flat affect                Past Medical History:  Diagnosis Date   Breast cancer ([place]) 03/2020   Family history of throat cancer    High cholesterol    Hypertension    Past Surgical History:  Procedure Laterality Date   AXILLARY SENTINEL NODE BIOPSY Right 12/16/2020   Procedure: TARGETED RIGHT SENTINEL LYMPH NODE DISSECTION;  Surgeon: Jovita Kussmaul, MD;  Location: [place];  Service: General;  Laterality: Right;   BREAST CYST EXCISION Left 12/16/2020   Procedure: CYST EXCISION LEFT AXILLA;  Surgeon: Jovita Kussmaul, MD;  Location: [place];  Service: General;  Laterality: Left;   BREAST RECONSTRUCTION WITH PLACEMENT OF TISSUE EXPANDER AND FLEX HD (ACELLULAR HYDRATED DERMIS) Bilateral 12/16/2020   Procedure: IMMEDIATE BILATERAL BREAST RECONSTRUCTION WITH PLACEMENT OF TISSUE EXPANDER AND FLEX HD (ACELLULAR HYDRATED DERMIS);  Surgeon: Wallace Going, DO;  Location: [place];  Service: Plastics;  Laterality: Bilateral;   CESAREAN SECTION     x 2   IR IMAGING GUIDED PORT INSERTION  03/30/2020   PORT-A-CATH REMOVAL Right 12/16/2020   Procedure: REMOVAL PORT-A-CATH;  Surgeon: Jovita Kussmaul, MD;  Location: [place];  Service: General;   Laterality: Right;   REMOVAL OF BILATERAL TISSUE EXPANDERS WITH PLACEMENT OF BILATERAL BREAST IMPLANTS Bilateral 02/22/2021   Procedure: Removal of bilateral breast expanders without implant placement;  Surgeon: Wallace Going, DO;  Location: [place];  Service: Plastics;  Laterality: Bilateral;   TOTAL MASTECTOMY Bilateral 12/16/2020   Procedure: TOTAL MASTECTOMY;  Surgeon: Jovita Kussmaul, MD;  Location: [place];  Service: General;  Laterality: Bilateral;   Patient Active Problem List   Diagnosis Date Noted   Encounter for gynecological examination with Papanicolaou smear of cervix 12/09/2021   History of breast cancer 12/09/2021   Generalized body aches 12/09/2021   Encounter for screening fecal occult blood testing 12/09/2021   Routine cervical smear 12/09/2021   Hematuria 12/09/2021   Vaginal atrophy 12/09/2021   Dyspnea on exertion 04/27/2021   Hypertension 04/27/2021   Smoker 04/27/2021   Mixed hyperlipidemia 04/27/2021   Breast cancer ([place]) 12/16/2020   Port-A-Cath in place 05/20/2020   Family history of throat cancer    Malignant neoplasm of upper-inner quadrant of left breast in female, estrogen receptor negative ([place]) 03/31/2020    ONSET DATE: 2021  REFERRING DIAG: Limited ROM of RUE  THERAPY DIAG:  Chronic right shoulder pain  Shoulder stiffness, right  Other symptoms and signs involving the musculoskeletal system  Rationale for Evaluation and Treatment: Rehabilitation  SUBJECTIVE:   SUBJECTIVE STATEMENT:  S: "My hand was hurting for 3 days"  PERTINENT HISTORY: Pt was diagnosed with breast cancer in 2021. Pt underwent chemo, radiation, and double mastectomy. Since the radiation, pt reports that she has had difficulty lifting her RUE.  PRECAUTIONS: None  WEIGHT BEARING RESTRICTIONS: No  PAIN:  Are you having pain? No  FALLS: Has patient fallen in last 6 months? No   PATIENT GOALS: To improve RUE movement   OBJECTIVE:   HAND  DOMINANCE: Right  ADLs: Overall ADLs: Pt reports that she is able to get dressed and shower with increased time and compensatory strategies, however she is unable to complete grooming, meal prep, cleaning, or other IADL's. Pt has difficulty with grasping and holding objects due to weakness in her hands and difficulty making a full fist.  FUNCTIONAL OUTCOME MEASURES: Upper Extremity Functional Scale (UEFS): 65%  UPPER EXTREMITY ROM:     Active ROM Right eval  Shoulder flexion 85  Shoulder abduction 63  Shoulder internal rotation 90  Shoulder external rotation -2  (Blank rows = not tested)   UPPER EXTREMITY MMT:     MMT Right eval  Shoulder flexion 4+/5  Shoulder abduction 4/5  Shoulder adduction 4/5  Shoulder extension 3-/5  Shoulder internal rotation 5/5  Shoulder external rotation 4-/5  Elbow flexion 5/5  Elbow extension 4/5  (Blank rows = not tested)  HAND FUNCTION: Grip strength: Right: 30 lbs; Left: 35 lbs, Lateral pinch: Right: 5 lbs, Left: 10 lbs, and 3 point pinch: Right: 5 lbs, Left: 7 lbs   COGNITION: Overall cognitive status: No family/caregiver present to determine baseline cognitive functioning Areas of impairment: Areas of impairment: Attention, Memory, Awareness, and Problem solving  Attention: Deficits Difficulty focusing on conversations, needs repetition Memory: Deficits Difficulty recalling events prior to coming to therapy Problem solving: Deficits Requires increased time and cuing  OBSERVATIONS: Unable to make a full fist in either hand. RUE 85% of a full fist, LUE 50% of a full fist   TODAY'S TREATMENT:                                                                                                                              DATE:   07/29/2022 -AA/ROM: supine-shoulder; flexion, abduction, horizontal abduction, internal/external rotation 10x each direction (achieving 50% ROM with flexion/abduction, less than 25% ROM external rotation)  -Wall  wash: 1' flexion -AA/ROM: seated-shoulder; flexion, abduction, horizontal abduction, internal/external rotation 10x each direction (achieving 50% ROM with flexion/abduction, less than 25% ROM external rotation)  -Scapular A/ROM: seated-row, extension, scapular elevation/depression, 10 reps each -Anterior glide: 3x10" holds at doorway -Functional reach: reaching across body into horizontal abduction to grab cones and stack onto tower for shoulder flexion  -Pulleys: 1' flexion, 1' abduction -UBE: 2 minutes forwards 2 minutes backwards level 1 speed 1.5  07/19/22 -AA/ROM: supine-shoulder; flexion, abduction, horizontal abduction, internal/external rotation 10x each direction (achieving 50% ROM with flexion/abduction, less than 25% ROM external rotation)  -Wall wash:  1' flexion -AA/ROM: seated-shoulder; flexion, abduction, horizontal abduction, internal/external rotation 10x each direction (achieving 50% ROM with flexion/abduction, less than 25% ROM external rotation)  -Scapular A/ROM: seated-row, extension, scapular elevation/depression, 10 reps each -Anterior glide: 3x10" holds at doorway -Pulleys: 1' flexion, 1' abduction  07/15/2022 - Manual: myofascial release to upper deltoid (tight), biceps, trapezius to decrease fascial restrictions and pain and increase ROM  - P/ROM: shoulder; flexion, abduction, horizontal abduction, internal/external rotation 10x each direction  - AA/ROM:  shoulder; flexion, abduction, horizontal abduction, internal/external rotation 10x each direction (achieving 50% ROM with flexion/abduction, less than 25% ROM external rotation)  - Isometrics: standing facing wall holding wash cloth with fist pushing into wall for flexion, abduction, internal/external rotation holding 10 seconds 2 reps each way  - Wall Slides: with arms bent on wall sliding as high into flexion (50%) for 5 reps     PATIENT EDUCATION: Education details:  Person educated: Patient Education method:  Explanation Education comprehension: verbalized understanding  HOME EXERCISE PROGRAM: 2/2: Table slides 2/6:   GOALS: Goals reviewed with patient? Yes  SHORT TERM GOALS: Target date: 08/26/22  Pt will be provided with and educated on a HEP to improve mobility in RUE required for ADL completion. Goal status: IN PROGRESS  2.  Pt will decrease pain in RUE to 3/10 or less in order to complete daily grooming tasks without taking a break due to pain.  Goal status: IN PROGRESS  3.  Pt will decrease RUE fascial restrictions to minimal amounts or less to improve mobility required for overhead reaching tasks. Goal status: IN PROGRESS  4.  Pt will increase A/ROM of RUE by 30 degrees or too WFL to improve ability to reach overhead and behind her back during dressing and bathing tasks.  Goal status: IN PROGRESS  5.  Pt will increase strength in RUE to 4+/5 to improve ability to perform lifting tasks required during cooking and cleaning.  Goal status: IN PROGRESS  6.  Pt will increase ROM in right digits to improve ability to form a full grasp required for holding items.  Goal status: IN PROGRESS   ASSESSMENT:  CLINICAL IMPRESSION: Pt still reporting low levels to no pain but limiting ROM throughout session. Attempted P/ROM but pt continuing to demonstrate guarding and limited motion. AA/ROM completed in supine and seated- demonstrating 50% shoulder ROM in flexion and abduction/ <25% external rotation again this session. Added stacking cones on elevated surface for functional reach into flexion- pt stated some pain in anterior proximal shoulder. Also added UE bike, pt required initial assist to start UE bike but was able to keep pace after initial assist. Pt reports she has been completing home exercises from previous session and they cause fatigue but no pain.   PERFORMANCE DEFICITS: in functional skills including ADLs, IADLs, coordination, ROM, strength, pain, fascial restrictions, Fine motor  control, Gross motor control, body mechanics, and UE functional use, cognitive skills including attention, memory, problem solving, and safety awareness, and psychosocial skills including coping strategies and environmental adaptation.    PLAN:  OT FREQUENCY: 2x/week  OT DURATION: 6 weeks  PLANNED INTERVENTIONS: self care/ADL training, therapeutic exercise, therapeutic activity, manual therapy, passive range of motion, functional mobility training, electrical stimulation, ultrasound, moist heat, cryotherapy, patient/family education, coping strategies training, and DME and/or AE instructions   CONSULTED AND AGREED WITH PLAN OF CARE: Patient  PLAN FOR NEXT SESSION: Manual Therapy prn, P/ROM if able to tolerate, AA/ROM, Wall Slides, functional reach into flexion  Arvil Persons, OTR/L  [phone] 07/19/22

## 2022-08-01 ENCOUNTER — Telehealth: Payer: Self-pay | Admitting: *Deleted

## 2022-08-01 NOTE — Progress Notes (Signed)
Patient Care Team: Muse, Noel Journey., PA-C as PCP - General Oletta Lamas Garwin Brothers, RN as Oncology Nurse Navigator (Oncology) Mauro Kaufmann, RN as Oncology Nurse Navigator Rockwell Germany, RN as Oncology Nurse Navigator Nicholas Lose, MD as Consulting Physician (Hematology and Oncology) Jovita Kussmaul, MD as Consulting Physician (General Surgery)  DIAGNOSIS: No diagnosis found.  SUMMARY OF ONCOLOGIC HISTORY: Oncology History  Malignant neoplasm of upper-inner quadrant of left breast in female, estrogen receptor negative ([place])  01/2020 Initial Diagnosis   Palpable right breast mass growing since July 2021.  Mammogram revealed 2.2 cm right breast mass with a 0.8 cm satellite lesion.  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   03/31/2020 Cancer Staging   Staging form: Breast, AJCC 8th Edition - Clinical stage from 03/31/2020: Stage IIIB (cT2, cN1(f), cM0, G3, ER-, PR-, HER2-) - Signed by Nicholas Lose, MD on 04/02/2020   04/09/2020 - 08/06/2020 Chemotherapy    Patient is on Treatment Plan: BREAST AC Q21D / CARBOPLATIN D1 + PACLITAXEL D1,8,15 Q21D, Keytruda discontinued for pneumonitis and Taxol discontinued after 4 cycles because of failure to thrive       12/16/2020 Surgery   Bilateral mastectomies   Left mastectomy: Intraductal papilloma, UDH Right mastectomy: No residual carcinoma was 1/3 lymph nodes positive ER negative, PR +20%, HER2 2+ by IHC, positive by FISH ratio 2.03   02/05/2021 - 05/21/2021 Chemotherapy   Patient is on Treatment Plan : BREAST Trastuzumab q21d       CHIEF COMPLIANT:   INTERVAL HISTORY: Jaylei Ensz is a   ALLERGIES:  has No Known Allergies.  MEDICATIONS:  Current Outpatient Medications  Medication Sig Dispense Refill   acetaminophen (TYLENOL) 325 MG tablet Take 325 mg by mouth every 6 (six) hours as needed (FOR PAIN).      albuterol (VENTOLIN HFA) 108 (90 Base) MCG/ACT inhaler Inhale 1-2 puffs into the lungs every 4 (four) hours as needed for wheezing or shortness of breath. 8 g 2   atorvastatin (LIPITOR) 20 MG tablet Take 20 mg by mouth at bedtime.     cetirizine (ZYRTEC ALLERGY) 10 MG tablet Take 1 tablet (10 mg total) by mouth daily. 30 tablet 0   hydrochlorothiazide (HYDRODIURIL) 12.5 MG tablet Take 1 tablet (12.5 mg total) by mouth daily. 5 tablet 0   Ibuprofen (ADVIL PO) Take by mouth.     No current facility-administered medications for this visit.    PHYSICAL EXAMINATION: ECOG PERFORMANCE STATUS: {CHL ONC ECOG PS:[phone]}  There were no vitals filed for this visit. There were no vitals filed for this visit.  BREAST:*** No palpable masses or nodules in either right or left breasts. No palpable axillary supraclavicular or infraclavicular adenopathy no breast tenderness or nipple discharge. (exam performed in the presence of a chaperone)  LABORATORY DATA:  I have reviewed the data as listed    Latest Ref Rng & Units 06/16/2022   12:59 PM 03/23/2022    2:27 PM 12/23/2021   11:05 AM  CMP  Glucose 70 - 99 mg/dL 70  95  76   BUN 6 - 20 mg/dL 7  12  8   $ Creatinine 0.44 - 1.00 mg/dL 0.65  0.62  0.65   Sodium 135 - 145 mmol/L 139  137  133   Potassium 3.5 - 5.1 mmol/L 3.8  3.7  3.6   Chloride 98 -  111 mmol/L 108  98  107   CO2 22 - 32 mmol/L 27  25  24   $ Calcium 8.9 - 10.3 mg/dL 9.4  9.1  8.5   Total Protein 6.5 - 8.1 g/dL 6.6     Total Bilirubin 0.3 - 1.2 mg/dL 0.5     Alkaline Phos 38 - 126 U/L 134     AST 15 - 41 U/L 25     ALT 0 - 44 U/L 11       Lab Results  Component Value Date   WBC 3.6 (L) 06/16/2022   HGB 11.4 (L) 06/16/2022   HCT 33.6 (L) 06/16/2022   MCV 81.2 06/16/2022   PLT 201 06/16/2022   NEUTROABS 1.0 (L) 06/16/2022    ASSESSMENT & PLAN:  No problem-specific Assessment & Plan notes found for this encounter.    No orders of the defined types were placed in this  encounter.  The patient has a good understanding of the overall plan. she agrees with it. she will call with any problems that may develop before the next visit here. Total time spent: 30 mins including face to face time and time spent for planning, charting and co-ordination of care   Suzzette Righter, [place] 08/01/22    I Gardiner Coins am acting as a Education administrator for [employer]  ***

## 2022-08-01 NOTE — Telephone Encounter (Signed)
[employer] paperwork completed by this nurse at this time.  Folder placed in designated mail bin for collaborative pick up for provider review, signature, and return.

## 2022-08-02 ENCOUNTER — Inpatient Hospital Stay: Payer: Medicare Other | Attending: Adult Health | Admitting: Hematology and Oncology

## 2022-08-02 ENCOUNTER — Other Ambulatory Visit: Payer: Self-pay

## 2022-08-02 ENCOUNTER — Ambulatory Visit (HOSPITAL_COMMUNITY): Payer: Medicaid Other

## 2022-08-02 VITALS — BP 149/65 | HR 108 | Temp 97.7°F | Resp 18 | Wt 129.3 lb

## 2022-08-02 DIAGNOSIS — Z9013 Acquired absence of bilateral breasts and nipples: Secondary | ICD-10-CM | POA: Diagnosis not present

## 2022-08-02 DIAGNOSIS — G629 Polyneuropathy, unspecified: Secondary | ICD-10-CM | POA: Diagnosis not present

## 2022-08-02 DIAGNOSIS — C50212 Malignant neoplasm of upper-inner quadrant of left female breast: Secondary | ICD-10-CM | POA: Diagnosis not present

## 2022-08-02 DIAGNOSIS — R634 Abnormal weight loss: Secondary | ICD-10-CM | POA: Diagnosis not present

## 2022-08-02 DIAGNOSIS — R110 Nausea: Secondary | ICD-10-CM | POA: Diagnosis present

## 2022-08-02 DIAGNOSIS — Z171 Estrogen receptor negative status [ER-]: Secondary | ICD-10-CM | POA: Diagnosis not present

## 2022-08-02 DIAGNOSIS — Z853 Personal history of malignant neoplasm of breast: Secondary | ICD-10-CM | POA: Insufficient documentation

## 2022-08-02 DIAGNOSIS — Z79899 Other long term (current) drug therapy: Secondary | ICD-10-CM | POA: Insufficient documentation

## 2022-08-02 MED ORDER — DULOXETINE HCL 20 MG PO CPEP: 20.0000 mg | ORAL_CAPSULE | Freq: Every day | ORAL | 3 refills | Status: DC

## 2022-08-02 NOTE — Assessment & Plan Note (Addendum)
01/2020: Palpable right breast mass growing since July 2021.  Mammogram revealed 2.2 cm right breast mass with a 0.8 cm satellite lesion.  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   Treatment plan  1. Neoadjuvant chemotherapy with Adriamycin and Cytoxan pembrolizumab 4 followed by Taxol weekly 4 with carboplatin pembrolizumab every 3 weeks x3 (chemo discontinued due to toxicities) Keytruda discontinued because of pneumonitis 2. bilateral mastectomies 12/16/2020: Left mastectomy intraductal papilloma, right mastectomy, no residual cancer in the breast 1/3 lymph nodes positive, ER negative, PR positive, HER2 2+ by IHC, FISH positive ratio 2.03, copy #6.2 3. Followed by adjuvant radiation therapy 4.  Herceptin every 3 weeks for 1 year started 02/05/2021 discontinued December 2022 -------------------------------------------------------------------------------------------------------------------------------------- Current treatment:  Surveillance   Shortness of breath: CT angiogram 03/19/2021: Negative for PE Echocardiogram 01/13/2021: EF 55 to 60%     Breast cancer surveillance: 1.  Breast exam 10/04/2021: Bilateral mastectomies no palpable lumps or nodules 2. no role of routine imaging studies since she had bilateral mastectomies.   Profound weight loss: She had lost almost 60 pounds over the past 2 years.     Pain in the hands: Due to neuropathy: I sent a prescription for Cymbalta.   CT CAP 07/06/2022: Pleural thickening: Benign, hypovascular area right lobe of the liver, soft tissue anterior mediastinum: Benign in appearance Liver MRI scheduled for 08/12/2022  Telephone visit after the liver MRI to discuss results

## 2022-08-03 ENCOUNTER — Telehealth: Payer: Self-pay | Admitting: Hematology and Oncology

## 2022-08-03 NOTE — Telephone Encounter (Signed)
Scheduled appointment per 2/21 los. Patient is aware.

## 2022-08-03 NOTE — Telephone Encounter (Signed)
Paperwork returned and received by this nurse today from collaborative signed by provider.   Connected with [employer] Provenzano, [phone] (home) to confirm ability to operate a motor vehicle (question no# [id number]). "No I do not drive.  My sister provides transportation and drives me to appointments or where ever I need to go." Faxed form to [facility] [phone]).  Form to [employer] bin designated for items to be scanned.  Copy mailed to patient's address on file completes process.  No further instructions received, actions required or performed by this nurse.

## 2022-08-09 ENCOUNTER — Encounter (HOSPITAL_COMMUNITY): Payer: Medicaid Other | Admitting: Occupational Therapy

## 2022-08-10 ENCOUNTER — Encounter: Payer: Self-pay | Admitting: Hematology and Oncology

## 2022-08-11 ENCOUNTER — Encounter (HOSPITAL_COMMUNITY): Payer: Self-pay | Admitting: Occupational Therapy

## 2022-08-11 ENCOUNTER — Ambulatory Visit (HOSPITAL_COMMUNITY): Payer: Medicare Other | Admitting: Occupational Therapy

## 2022-08-11 DIAGNOSIS — R29898 Other symptoms and signs involving the musculoskeletal system: Secondary | ICD-10-CM

## 2022-08-11 DIAGNOSIS — M25611 Stiffness of right shoulder, not elsewhere classified: Secondary | ICD-10-CM

## 2022-08-11 DIAGNOSIS — G8929 Other chronic pain: Secondary | ICD-10-CM

## 2022-08-11 DIAGNOSIS — M25511 Pain in right shoulder: Secondary | ICD-10-CM | POA: Diagnosis not present

## 2022-08-11 NOTE — Patient Instructions (Signed)

## 2022-08-11 NOTE — Therapy (Signed)
OUTPATIENT OCCUPATIONAL THERAPY ORTHO TREATMENT  Patient Name: Sharon Fischer MRN: [id number] DOB:11-20-1965, 57 y.o., female Today's Date: 08/11/2022  PCP: Raiford Simmonds., PA-C REFERRING PROVIDER: Nicholas Lose, MD  END OF SESSION:  OT End of Session - 08/11/22 1030     Visit Number 5    Number of Visits 13    Date for OT Re-Evaluation 08/26/22    Authorization Type Medicaid [employer]    Authorization Time Period 7 visits approved 1/25-3/24/24    Authorization - Visit Number 4    Authorization - Number of Visits 7    Progress Note Due on Visit 10    OT Start Time 1030    OT Stop Time 1110    OT Time Calculation (min) 40 min    Activity Tolerance Patient tolerated treatment well    Behavior During Therapy [facility] for tasks assessed/performed;Flat affect             Past Medical History:  Diagnosis Date   Breast cancer ([place]) 03/2020   Family history of throat cancer    High cholesterol    Hypertension    Past Surgical History:  Procedure Laterality Date   AXILLARY SENTINEL NODE BIOPSY Right 12/16/2020   Procedure: TARGETED RIGHT SENTINEL LYMPH NODE DISSECTION;  Surgeon: Jovita Kussmaul, MD;  Location: [place];  Service: General;  Laterality: Right;   BREAST CYST EXCISION Left 12/16/2020   Procedure: CYST EXCISION LEFT AXILLA;  Surgeon: Jovita Kussmaul, MD;  Location: [place];  Service: General;  Laterality: Left;   BREAST RECONSTRUCTION WITH PLACEMENT OF TISSUE EXPANDER AND FLEX HD (ACELLULAR HYDRATED DERMIS) Bilateral 12/16/2020   Procedure: IMMEDIATE BILATERAL BREAST RECONSTRUCTION WITH PLACEMENT OF TISSUE EXPANDER AND FLEX HD (ACELLULAR HYDRATED DERMIS);  Surgeon: Wallace Going, DO;  Location: [place];  Service: Plastics;  Laterality: Bilateral;   CESAREAN SECTION     x 2   IR IMAGING GUIDED PORT INSERTION  03/30/2020   PORT-A-CATH REMOVAL Right 12/16/2020   Procedure: REMOVAL PORT-A-CATH;  Surgeon: Jovita Kussmaul, MD;  Location: [place];  Service: General;   Laterality: Right;   REMOVAL OF BILATERAL TISSUE EXPANDERS WITH PLACEMENT OF BILATERAL BREAST IMPLANTS Bilateral 02/22/2021   Procedure: Removal of bilateral breast expanders without implant placement;  Surgeon: Wallace Going, DO;  Location: [place];  Service: Plastics;  Laterality: Bilateral;   TOTAL MASTECTOMY Bilateral 12/16/2020   Procedure: TOTAL MASTECTOMY;  Surgeon: Jovita Kussmaul, MD;  Location: [place];  Service: General;  Laterality: Bilateral;   Patient Active Problem List   Diagnosis Date Noted   Encounter for gynecological examination with Papanicolaou smear of cervix 12/09/2021   History of breast cancer 12/09/2021   Generalized body aches 12/09/2021   Encounter for screening fecal occult blood testing 12/09/2021   Routine cervical smear 12/09/2021   Hematuria 12/09/2021   Vaginal atrophy 12/09/2021   Dyspnea on exertion 04/27/2021   Hypertension 04/27/2021   Smoker 04/27/2021   Mixed hyperlipidemia 04/27/2021   Breast cancer ([place]) 12/16/2020   Port-A-Cath in place 05/20/2020   Family history of throat cancer    Malignant neoplasm of upper-inner quadrant of left breast in female, estrogen receptor negative ([place]) 03/31/2020    ONSET DATE: 2021  REFERRING DIAG: Limited ROM of RUE  THERAPY DIAG:  Chronic right shoulder pain  Shoulder stiffness, right  Other symptoms and signs involving the musculoskeletal system  Rationale for Evaluation and Treatment: Rehabilitation  SUBJECTIVE:   SUBJECTIVE STATEMENT: S: "I'm still  having a lot of trouble"  PERTINENT HISTORY: Pt was diagnosed with breast cancer in 2021. Pt underwent chemo, radiation, and double mastectomy. Since the radiation, pt reports that she has had difficulty lifting her RUE.  PRECAUTIONS: None  WEIGHT BEARING RESTRICTIONS: No  PAIN:  Are you having pain? Yes: NPRS scale: ??/10 Pain location: scapular region and anterior shoulder girdle Pain description: aching and  sore Aggravating factors: movement and cold Relieving factors: unsure  FALLS: Has patient fallen in last 6 months? No   PATIENT GOALS: To improve RUE movement   OBJECTIVE:   HAND DOMINANCE: Right  ADLs: Overall ADLs: Pt reports that she is able to get dressed and shower with increased time and compensatory strategies, however she is unable to complete grooming, meal prep, cleaning, or other IADL's. Pt has difficulty with grasping and holding objects due to weakness in her hands and difficulty making a full fist.  FUNCTIONAL OUTCOME MEASURES: Upper Extremity Functional Scale (UEFS): 65%  UPPER EXTREMITY ROM:     Active ROM Right eval  Shoulder flexion 85  Shoulder abduction 63  Shoulder internal rotation 90  Shoulder external rotation -2  (Blank rows = not tested)   UPPER EXTREMITY MMT:     MMT Right eval  Shoulder flexion 4+/5  Shoulder abduction 4/5  Shoulder adduction 4/5  Shoulder extension 3-/5  Shoulder internal rotation 5/5  Shoulder external rotation 4-/5  Elbow flexion 5/5  Elbow extension 4/5  (Blank rows = not tested)  HAND FUNCTION: Grip strength: Right: 30 lbs; Left: 35 lbs, Lateral pinch: Right: 5 lbs, Left: 10 lbs, and 3 point pinch: Right: 5 lbs, Left: 7 lbs   COGNITION: Overall cognitive status: No family/caregiver present to determine baseline cognitive functioning Areas of impairment: Areas of impairment: Attention, Memory, Awareness, and Problem solving  Attention: Deficits Difficulty focusing on conversations, needs repetition Memory: Deficits Difficulty recalling events prior to coming to therapy Problem solving: Deficits Requires increased time and cuing  OBSERVATIONS: Unable to make a full fist in either hand. RUE 85% of a full fist, LUE 50% of a full fist   TODAY'S TREATMENT:                                                                                                                              DATE: 08/11/22 -Wall Slides:  flexion and abduction, x10 -AA/ROM: seated, flexion, abduction, protraction, horizontal abduction, er/IR, x10 (achieving 50% ROM with flexion/abduction, and less than 25% of ROM with external rotation) -A/ROM: seated, flexion, abduction, protraction, horizontal abduction, er/IR, x10 (achieving 50% ROM with flexion/abduction, and less than 25% of ROM with external rotation) -Wall Wash: 2x1' flexion -Scapular strengthening: yellow band, extension, retraction, rows, x10  07/29/2022 -AA/ROM: supine-shoulder; flexion, abduction, horizontal abduction, internal/external rotation 10x each direction (achieving 50% ROM with flexion/abduction, less than 25% ROM external rotation)  -Wall wash: 1' flexion -AA/ROM: seated-shoulder; flexion, abduction, horizontal abduction, internal/external rotation 10x each direction (achieving 50% ROM with  flexion/abduction, less than 25% ROM external rotation)  -Scapular A/ROM: seated-row, extension, scapular elevation/depression, 10 reps each -Anterior glide: 3x10" holds at doorway -Functional reach: reaching across body into horizontal abduction to grab cones and stack onto tower for shoulder flexion  -Pulleys: 1' flexion, 1' abduction -UBE: 2 minutes forwards 2 minutes backwards level 1 speed 1.5  07/19/22 -AA/ROM: supine-shoulder; flexion, abduction, horizontal abduction, internal/external rotation 10x each direction (achieving 50% ROM with flexion/abduction, less than 25% ROM external rotation)  -Wall wash: 1' flexion -AA/ROM: seated-shoulder; flexion, abduction, horizontal abduction, internal/external rotation 10x each direction (achieving 50% ROM with flexion/abduction, less than 25% ROM external rotation)  -Scapular A/ROM: seated-row, extension, scapular elevation/depression, 10 reps each -Anterior glide: 3x10" holds at doorway -Pulleys: 1' flexion, 1' abduction     PATIENT EDUCATION: Education details: Scapular Strengthening (yellow band) Person educated:  Patient Education method: Explanation Education comprehension: verbalized understanding  HOME EXERCISE PROGRAM: 2/2: Table slides 2/6: AA/ROM 2/29: Scapular Strengthening (yellow band)  GOALS: Goals reviewed with patient? Yes  SHORT TERM GOALS: Target date: 08/26/22  Pt will be provided with and educated on a HEP to improve mobility in RUE required for ADL completion. Goal status: IN PROGRESS  2.  Pt will decrease pain in RUE to 3/10 or less in order to complete daily grooming tasks without taking a break due to pain.  Goal status: IN PROGRESS  3.  Pt will decrease RUE fascial restrictions to minimal amounts or less to improve mobility required for overhead reaching tasks. Goal status: IN PROGRESS  4.  Pt will increase A/ROM of RUE by 30 degrees or too WFL to improve ability to reach overhead and behind her back during dressing and bathing tasks.  Goal status: IN PROGRESS  5.  Pt will increase strength in RUE to 4+/5 to improve ability to perform lifting tasks required during cooking and cleaning.  Goal status: IN PROGRESS  6.  Pt will increase ROM in right digits to improve ability to form a full grasp required for holding items.  Goal status: IN PROGRESS   ASSESSMENT:  CLINICAL IMPRESSION: Pt continuing to report no pain, however during all tasks that require moving her UE, she expresses moderate to severe pain, limiting her ability to complete tasks or improve ROM. This session OT added A/ROM in addition to AA/ROM, where pt continued to achieve approximately 50% of ROM with flexion and abduction and only 25% of ER. Additionally, OT added scapular strengthening with yellow theraband's to increase strength. Verbal, tactile, and visual cuing provided for positioning, technique, and sequencing throughout session.   PERFORMANCE DEFICITS: in functional skills including ADLs, IADLs, coordination, ROM, strength, pain, fascial restrictions, Fine motor control, Gross motor control, body  mechanics, and UE functional use, cognitive skills including attention, memory, problem solving, and safety awareness, and psychosocial skills including coping strategies and environmental adaptation.    PLAN:  OT FREQUENCY: 2x/week  OT DURATION: 6 weeks  PLANNED INTERVENTIONS: self care/ADL training, therapeutic exercise, therapeutic activity, manual therapy, passive range of motion, functional mobility training, electrical stimulation, ultrasound, moist heat, cryotherapy, patient/family education, coping strategies training, and DME and/or AE instructions   CONSULTED AND AGREED WITH PLAN OF CARE: Patient  PLAN FOR NEXT SESSION: Manual Therapy prn, P/ROM if able to tolerate, AA/ROM, Wall Slides, functional reach into flexion    Paulita Fujita, OTR/L [facility] [phone]

## 2022-08-12 ENCOUNTER — Ambulatory Visit (HOSPITAL_COMMUNITY)
Admission: RE | Admit: 2022-08-12 | Discharge: 2022-08-12 | Disposition: A | Discharge status: Home / Self Care | Payer: Medicare Other | Source: Ambulatory Visit | Attending: Adult Health | Admitting: Adult Health

## 2022-08-12 DIAGNOSIS — K769 Liver disease, unspecified: Secondary | ICD-10-CM | POA: Insufficient documentation

## 2022-08-12 DIAGNOSIS — C50212 Malignant neoplasm of upper-inner quadrant of left female breast: Secondary | ICD-10-CM | POA: Insufficient documentation

## 2022-08-12 DIAGNOSIS — Z9189 Other specified personal risk factors, not elsewhere classified: Secondary | ICD-10-CM | POA: Insufficient documentation

## 2022-08-12 DIAGNOSIS — Z171 Estrogen receptor negative status [ER-]: Secondary | ICD-10-CM | POA: Insufficient documentation

## 2022-08-12 MED ORDER — GADOBUTROL 1 MMOL/ML IV SOLN
5.5000 mL | Freq: Once | INTRAVENOUS | Status: AC | PRN
  Administered 2022-08-12: 5.5 mL via INTRAVENOUS

## 2022-08-16 ENCOUNTER — Telehealth: Payer: Self-pay

## 2022-08-16 ENCOUNTER — Ambulatory Visit (HOSPITAL_COMMUNITY): Payer: Medicare Other | Attending: Hematology and Oncology | Admitting: Occupational Therapy

## 2022-08-16 ENCOUNTER — Encounter (HOSPITAL_COMMUNITY): Payer: Self-pay | Admitting: Occupational Therapy

## 2022-08-16 DIAGNOSIS — M25611 Stiffness of right shoulder, not elsewhere classified: Secondary | ICD-10-CM | POA: Diagnosis present

## 2022-08-16 DIAGNOSIS — R29898 Other symptoms and signs involving the musculoskeletal system: Secondary | ICD-10-CM | POA: Insufficient documentation

## 2022-08-16 DIAGNOSIS — M25511 Pain in right shoulder: Secondary | ICD-10-CM | POA: Insufficient documentation

## 2022-08-16 DIAGNOSIS — G8929 Other chronic pain: Secondary | ICD-10-CM | POA: Insufficient documentation

## 2022-08-16 NOTE — Telephone Encounter (Signed)
Spoke with patient and made aware that her MR liver shows no cancer and to follow up with Dr.Gudena on 10/14/2022 per Wilber Bihari NP.

## 2022-08-16 NOTE — Telephone Encounter (Signed)
-----   Message from Gardenia Phlegm, NP sent at 08/15/2022  4:20 PM EST ----- Please share good news with patient.  MR liver shows no cancer.  :)  She can f/u with Dr. Lindi Adie in 10/2022 ----- Message ----- From: Interface, Rad Results In Sent: 08/15/2022   8:50 AM EST To: Gardenia Phlegm, NP

## 2022-08-16 NOTE — Therapy (Unsigned)
OUTPATIENT OCCUPATIONAL THERAPY ORTHO TREATMENT  Patient Name: Sharon Fischer MRN: [id number] DOB:September 07, 1965, 57 y.o., female Today's Date: 08/16/2022  PCP: Raiford Simmonds., PA-C REFERRING PROVIDER: Nicholas Lose, MD  END OF SESSION:  OT End of Session - 08/16/22 1122     Visit Number 6    Number of Visits 13    Date for OT Re-Evaluation 08/26/22    Authorization Type Medicaid [employer]    Authorization Time Period 7 visits approved 1/25-3/24/24    Authorization - Visit Number 5    Authorization - Number of Visits 7    Progress Note Due on Visit 10    OT Start Time 1120    OT Stop Time 1200    OT Time Calculation (min) 40 min    Activity Tolerance Patient tolerated treatment well    Behavior During Therapy [facility] for tasks assessed/performed;Flat affect             Past Medical History:  Diagnosis Date   Breast cancer ([place]) 03/2020   Family history of throat cancer    High cholesterol    Hypertension    Past Surgical History:  Procedure Laterality Date   AXILLARY SENTINEL NODE BIOPSY Right 12/16/2020   Procedure: TARGETED RIGHT SENTINEL LYMPH NODE DISSECTION;  Surgeon: Jovita Kussmaul, MD;  Location: [place];  Service: General;  Laterality: Right;   BREAST CYST EXCISION Left 12/16/2020   Procedure: CYST EXCISION LEFT AXILLA;  Surgeon: Jovita Kussmaul, MD;  Location: [place];  Service: General;  Laterality: Left;   BREAST RECONSTRUCTION WITH PLACEMENT OF TISSUE EXPANDER AND FLEX HD (ACELLULAR HYDRATED DERMIS) Bilateral 12/16/2020   Procedure: IMMEDIATE BILATERAL BREAST RECONSTRUCTION WITH PLACEMENT OF TISSUE EXPANDER AND FLEX HD (ACELLULAR HYDRATED DERMIS);  Surgeon: Wallace Going, DO;  Location: [place];  Service: Plastics;  Laterality: Bilateral;   CESAREAN SECTION     x 2   IR IMAGING GUIDED PORT INSERTION  03/30/2020   PORT-A-CATH REMOVAL Right 12/16/2020   Procedure: REMOVAL PORT-A-CATH;  Surgeon: Jovita Kussmaul, MD;  Location: [place];  Service: General;   Laterality: Right;   REMOVAL OF BILATERAL TISSUE EXPANDERS WITH PLACEMENT OF BILATERAL BREAST IMPLANTS Bilateral 02/22/2021   Procedure: Removal of bilateral breast expanders without implant placement;  Surgeon: Wallace Going, DO;  Location: [place];  Service: Plastics;  Laterality: Bilateral;   TOTAL MASTECTOMY Bilateral 12/16/2020   Procedure: TOTAL MASTECTOMY;  Surgeon: Jovita Kussmaul, MD;  Location: [place];  Service: General;  Laterality: Bilateral;   Patient Active Problem List   Diagnosis Date Noted   Encounter for gynecological examination with Papanicolaou smear of cervix 12/09/2021   History of breast cancer 12/09/2021   Generalized body aches 12/09/2021   Encounter for screening fecal occult blood testing 12/09/2021   Routine cervical smear 12/09/2021   Hematuria 12/09/2021   Vaginal atrophy 12/09/2021   Dyspnea on exertion 04/27/2021   Hypertension 04/27/2021   Smoker 04/27/2021   Mixed hyperlipidemia 04/27/2021   Breast cancer ([place]) 12/16/2020   Port-A-Cath in place 05/20/2020   Family history of throat cancer    Malignant neoplasm of upper-inner quadrant of left breast in female, estrogen receptor negative ([place]) 03/31/2020    ONSET DATE: 2021  REFERRING DIAG: Limited ROM of RUE  THERAPY DIAG:  Chronic right shoulder pain  Shoulder stiffness, right  Other symptoms and signs involving the musculoskeletal system  Rationale for Evaluation and Treatment: Rehabilitation  SUBJECTIVE:   SUBJECTIVE STATEMENT: S: "I don't  feel like it is getting better."  PERTINENT HISTORY: Pt was diagnosed with breast cancer in 2021. Pt underwent chemo, radiation, and double mastectomy. Since the radiation, pt reports that she has had difficulty lifting her RUE.  PRECAUTIONS: None  WEIGHT BEARING RESTRICTIONS: No  PAIN:  Are you having pain? Yes: NPRS scale: 7/10 Pain location: scapular region and anterior shoulder girdle Pain description: aching and  sore Aggravating factors: movement and cold Relieving factors: unsure  FALLS: Has patient fallen in last 6 months? No   PATIENT GOALS: To improve RUE movement   OBJECTIVE:   HAND DOMINANCE: Right  ADLs: Overall ADLs: Pt reports that she is able to get dressed and shower with increased time and compensatory strategies, however she is unable to complete grooming, meal prep, cleaning, or other IADL's. Pt has difficulty with grasping and holding objects due to weakness in her hands and difficulty making a full fist.  FUNCTIONAL OUTCOME MEASURES: Upper Extremity Functional Scale (UEFS): 65%  UPPER EXTREMITY ROM:     Active ROM Right eval  Shoulder flexion 85  Shoulder abduction 63  Shoulder internal rotation 90  Shoulder external rotation -2  (Blank rows = not tested)   UPPER EXTREMITY MMT:     MMT Right eval  Shoulder flexion 4+/5  Shoulder abduction 4/5  Shoulder adduction 4/5  Shoulder extension 3-/5  Shoulder internal rotation 5/5  Shoulder external rotation 4-/5  Elbow flexion 5/5  Elbow extension 4/5  (Blank rows = not tested)  HAND FUNCTION: Grip strength: Right: 30 lbs; Left: 35 lbs, Lateral pinch: Right: 5 lbs, Left: 10 lbs, and 3 point pinch: Right: 5 lbs, Left: 7 lbs   COGNITION: Overall cognitive status: No family/caregiver present to determine baseline cognitive functioning Areas of impairment: Areas of impairment: Attention, Memory, Awareness, and Problem solving  Attention: Deficits Difficulty focusing on conversations, needs repetition Memory: Deficits Difficulty recalling events prior to coming to therapy Problem solving: Deficits Requires increased time and cuing  OBSERVATIONS: Unable to make a full fist in either hand. RUE 85% of a full fist, LUE 50% of a full fist   TODAY'S TREATMENT:                                                                                                                              DATE: 08/16/22 -Ball rolls:  flexion, abduction, x15 -Wall Slides: flexion, abduction, x10 -Wall Wash: 1' flexion, 1' abduction -A/ROM: seated, flexion, abduction, protraction, horizontal abduction, er/IR, x10 -Functional reaching: 1lb wrist weight, placing 10 pegs on shoulder height row -UBE: level 2, RPM 3.0+, 3' forwards and backwards  08/11/22 -Wall Slides: flexion and abduction, x10 -AA/ROM: seated, flexion, abduction, protraction, horizontal abduction, er/IR, x10 (achieving 50% ROM with flexion/abduction, and less than 25% of ROM with external rotation) -A/ROM: seated, flexion, abduction, protraction, horizontal abduction, er/IR, x10 (achieving 50% ROM with flexion/abduction, and less than 25% of ROM with external rotation) -Wall Wash: 2x1' flexion -Scapular strengthening: yellow  band, extension, retraction, rows, x10  07/29/2022 -AA/ROM: supine-shoulder; flexion, abduction, horizontal abduction, internal/external rotation 10x each direction (achieving 50% ROM with flexion/abduction, less than 25% ROM external rotation)  -Wall wash: 1' flexion -AA/ROM: seated-shoulder; flexion, abduction, horizontal abduction, internal/external rotation 10x each direction (achieving 50% ROM with flexion/abduction, less than 25% ROM external rotation)  -Scapular A/ROM: seated-row, extension, scapular elevation/depression, 10 reps each -Anterior glide: 3x10" holds at doorway -Functional reach: reaching across body into horizontal abduction to grab cones and stack onto tower for shoulder flexion  -Pulleys: 1' flexion, 1' abduction -UBE: 2 minutes forwards 2 minutes backwards level 1 speed 1.5    PATIENT EDUCATION: Education details: English as a second language teacher (yellow band) Person educated: Patient Education method: Explanation Education comprehension: verbalized understanding  HOME EXERCISE PROGRAM: 2/2: Table slides 2/6: AA/ROM 2/29: Scapular Strengthening (yellow band)  GOALS: Goals reviewed with patient? Yes  SHORT TERM  GOALS: Target date: 08/26/22  Pt will be provided with and educated on a HEP to improve mobility in RUE required for ADL completion. Goal status: IN PROGRESS  2.  Pt will decrease pain in RUE to 3/10 or less in order to complete daily grooming tasks without taking a break due to pain.  Goal status: IN PROGRESS  3.  Pt will decrease RUE fascial restrictions to minimal amounts or less to improve mobility required for overhead reaching tasks. Goal status: IN PROGRESS  4.  Pt will increase A/ROM of RUE by 30 degrees or too WFL to improve ability to reach overhead and behind her back during dressing and bathing tasks.  Goal status: IN PROGRESS  5.  Pt will increase strength in RUE to 4+/5 to improve ability to perform lifting tasks required during cooking and cleaning.  Goal status: IN PROGRESS  6.  Pt will increase ROM in right digits to improve ability to form a full grasp required for holding items.  Goal status: IN PROGRESS   ASSESSMENT:  CLINICAL IMPRESSION: Pt continuing to report no pain, however during all tasks that require moving her UE, she expresses moderate to severe pain, limiting her ability to complete tasks or improve ROM. This session OT added A/ROM in addition to AA/ROM, where pt continued to achieve approximately 50% of ROM with flexion and abduction and only 25% of ER. Additionally, OT added scapular strengthening with yellow theraband's to increase strength. Verbal, tactile, and visual cuing provided for positioning, technique, and sequencing throughout session.   PERFORMANCE DEFICITS: in functional skills including ADLs, IADLs, coordination, ROM, strength, pain, fascial restrictions, Fine motor control, Gross motor control, body mechanics, and UE functional use, cognitive skills including attention, memory, problem solving, and safety awareness, and psychosocial skills including coping strategies and environmental adaptation.    PLAN:  OT FREQUENCY: 2x/week  OT  DURATION: 6 weeks  PLANNED INTERVENTIONS: self care/ADL training, therapeutic exercise, therapeutic activity, manual therapy, passive range of motion, functional mobility training, electrical stimulation, ultrasound, moist heat, cryotherapy, patient/family education, coping strategies training, and DME and/or AE instructions   CONSULTED AND AGREED WITH PLAN OF CARE: Patient  PLAN FOR NEXT SESSION: Manual Therapy prn, P/ROM if able to tolerate, AA/ROM, Wall Slides, functional reach into flexion    Paulita Fujita, OTR/L [facility] [phone]

## 2022-08-19 ENCOUNTER — Encounter (HOSPITAL_COMMUNITY): Payer: Medicaid Other | Admitting: Occupational Therapy

## 2022-08-23 ENCOUNTER — Encounter (HOSPITAL_COMMUNITY): Payer: Self-pay | Admitting: Occupational Therapy

## 2022-08-23 ENCOUNTER — Ambulatory Visit (HOSPITAL_COMMUNITY): Payer: Medicare Other | Admitting: Occupational Therapy

## 2022-08-23 DIAGNOSIS — R29898 Other symptoms and signs involving the musculoskeletal system: Secondary | ICD-10-CM

## 2022-08-23 DIAGNOSIS — M25511 Pain in right shoulder: Secondary | ICD-10-CM | POA: Diagnosis not present

## 2022-08-23 DIAGNOSIS — G8929 Other chronic pain: Secondary | ICD-10-CM

## 2022-08-23 DIAGNOSIS — M25611 Stiffness of right shoulder, not elsewhere classified: Secondary | ICD-10-CM

## 2022-08-23 NOTE — Therapy (Signed)
OUTPATIENT OCCUPATIONAL THERAPY ORTHO TREATMENT  Patient Name: Sharon Fischer MRN: [id number] DOB:1965/09/14, 57 y.o., female Today's Date: 08/23/2022  PCP: Raiford Simmonds., PA-C REFERRING PROVIDER: Nicholas Lose, MD  END OF SESSION:  OT End of Session - 08/23/22 1121     Visit Number 7    Number of Visits 13    Date for OT Re-Evaluation 08/26/22    Authorization Type Medicaid [employer]    Authorization Time Period 7 visits approved 1/25-3/24/24    Authorization - Visit Number 6    Authorization - Number of Visits 7    Progress Note Due on Visit 10    OT Start Time 1120    OT Stop Time 1200    OT Time Calculation (min) 40 min    Activity Tolerance Patient tolerated treatment well    Behavior During Therapy [facility] for tasks assessed/performed;Flat affect             Past Medical History:  Diagnosis Date   Breast cancer ([place]) 03/2020   Family history of throat cancer    High cholesterol    Hypertension    Past Surgical History:  Procedure Laterality Date   AXILLARY SENTINEL NODE BIOPSY Right 12/16/2020   Procedure: TARGETED RIGHT SENTINEL LYMPH NODE DISSECTION;  Surgeon: Jovita Kussmaul, MD;  Location: [place];  Service: General;  Laterality: Right;   BREAST CYST EXCISION Left 12/16/2020   Procedure: CYST EXCISION LEFT AXILLA;  Surgeon: Jovita Kussmaul, MD;  Location: [place];  Service: General;  Laterality: Left;   BREAST RECONSTRUCTION WITH PLACEMENT OF TISSUE EXPANDER AND FLEX HD (ACELLULAR HYDRATED DERMIS) Bilateral 12/16/2020   Procedure: IMMEDIATE BILATERAL BREAST RECONSTRUCTION WITH PLACEMENT OF TISSUE EXPANDER AND FLEX HD (ACELLULAR HYDRATED DERMIS);  Surgeon: Wallace Going, DO;  Location: [place];  Service: Plastics;  Laterality: Bilateral;   CESAREAN SECTION     x 2   IR IMAGING GUIDED PORT INSERTION  03/30/2020   PORT-A-CATH REMOVAL Right 12/16/2020   Procedure: REMOVAL PORT-A-CATH;  Surgeon: Jovita Kussmaul, MD;  Location: [place];  Service: General;   Laterality: Right;   REMOVAL OF BILATERAL TISSUE EXPANDERS WITH PLACEMENT OF BILATERAL BREAST IMPLANTS Bilateral 02/22/2021   Procedure: Removal of bilateral breast expanders without implant placement;  Surgeon: Wallace Going, DO;  Location: [place];  Service: Plastics;  Laterality: Bilateral;   TOTAL MASTECTOMY Bilateral 12/16/2020   Procedure: TOTAL MASTECTOMY;  Surgeon: Jovita Kussmaul, MD;  Location: [place];  Service: General;  Laterality: Bilateral;   Patient Active Problem List   Diagnosis Date Noted   Encounter for gynecological examination with Papanicolaou smear of cervix 12/09/2021   History of breast cancer 12/09/2021   Generalized body aches 12/09/2021   Encounter for screening fecal occult blood testing 12/09/2021   Routine cervical smear 12/09/2021   Hematuria 12/09/2021   Vaginal atrophy 12/09/2021   Dyspnea on exertion 04/27/2021   Hypertension 04/27/2021   Smoker 04/27/2021   Mixed hyperlipidemia 04/27/2021   Breast cancer ([place]) 12/16/2020   Port-A-Cath in place 05/20/2020   Family history of throat cancer    Malignant neoplasm of upper-inner quadrant of left breast in female, estrogen receptor negative ([place]) 03/31/2020    ONSET DATE: 2021  REFERRING DIAG: Limited ROM of RUE  THERAPY DIAG:  Chronic right shoulder pain  Shoulder stiffness, right  Other symptoms and signs involving the musculoskeletal system  Rationale for Evaluation and Treatment: Rehabilitation  SUBJECTIVE:   SUBJECTIVE STATEMENT: S: "It has  really been bothering me"  PERTINENT HISTORY: Pt was diagnosed with breast cancer in 2021. Pt underwent chemo, radiation, and double mastectomy. Since the radiation, pt reports that she has had difficulty lifting her RUE.  PRECAUTIONS: None  WEIGHT BEARING RESTRICTIONS: No  PAIN:  Are you having pain? Yes: NPRS scale: 7/10 Pain location: scapular region and anterior shoulder girdle Pain description: aching and  sore Aggravating factors: movement and cold Relieving factors: unsure  FALLS: Has patient fallen in last 6 months? No   PATIENT GOALS: To improve RUE movement   OBJECTIVE:   HAND DOMINANCE: Right  ADLs: Overall ADLs: Pt reports that she is able to get dressed and shower with increased time and compensatory strategies, however she is unable to complete grooming, meal prep, cleaning, or other IADL's. Pt has difficulty with grasping and holding objects due to weakness in her hands and difficulty making a full fist.  FUNCTIONAL OUTCOME MEASURES: Upper Extremity Functional Scale (UEFS): 65%  UPPER EXTREMITY ROM:     Active ROM Right eval  Shoulder flexion 85  Shoulder abduction 63  Shoulder internal rotation 90  Shoulder external rotation -2  (Blank rows = not tested)   UPPER EXTREMITY MMT:     MMT Right eval  Shoulder flexion 4+/5  Shoulder abduction 4/5  Shoulder adduction 4/5  Shoulder extension 3-/5  Shoulder internal rotation 5/5  Shoulder external rotation 4-/5  Elbow flexion 5/5  Elbow extension 4/5  (Blank rows = not tested)  HAND FUNCTION: Grip strength: Right: 30 lbs; Left: 35 lbs, Lateral pinch: Right: 5 lbs, Left: 10 lbs, and 3 point pinch: Right: 5 lbs, Left: 7 lbs   COGNITION: Overall cognitive status: No family/caregiver present to determine baseline cognitive functioning Areas of impairment: Areas of impairment: Attention, Memory, Awareness, and Problem solving  Attention: Deficits Difficulty focusing on conversations, needs repetition Memory: Deficits Difficulty recalling events prior to coming to therapy Problem solving: Deficits Requires increased time and cuing  OBSERVATIONS: Unable to make a full fist in either hand. RUE 85% of a full fist, LUE 50% of a full fist   TODAY'S TREATMENT:                                                                                                                              DATE: 08/23/22 -A/ROM: seated,  flexion, abduction, protraction, horizontal abduction, er/IR, x10 -Shoulder Strengthening: 1lb dumbbell, flexion, abduction, protraction, horizontal abduction, er/IR, x10 -Bicep curls: 2lb dumbbells, x15 -Ball Roll on the wall: flexion x10 -scapular strengthening: yellow theraband, extension, protraction, retraction, rows, x15 -Functional Reaching: retrieving and replacing 15 items from shelf at shoulder height -UBE: Level 2, 3.0+ RPM, 4' forwards  08/16/22 -Ball rolls: flexion, abduction, x15 -Wall Slides: flexion, abduction, x10 -Wall Wash: 1' flexion, 1' abduction -A/ROM: seated, flexion, abduction, protraction, horizontal abduction, er/IR, x10 -Functional reaching: 1lb wrist weight, placing 10 pegs on shoulder height row -UBE: level 2, RPM 3.0+, 2.5' forwards and backwards  08/11/22 -Wall Slides: flexion and abduction, x10 -AA/ROM: seated, flexion, abduction, protraction, horizontal abduction, er/IR, x10 (achieving 50% ROM with flexion/abduction, and less than 25% of ROM with external rotation) -A/ROM: seated, flexion, abduction, protraction, horizontal abduction, er/IR, x10 (achieving 50% ROM with flexion/abduction, and less than 25% of ROM with external rotation) -Wall Wash: 2x1' flexion -Scapular strengthening: yellow band, extension, retraction, rows, x10    PATIENT EDUCATION: Education details: Shoulder strengthening with dumbbells Person educated: Patient Education method: Explanation Education comprehension: verbalized understanding  HOME EXERCISE PROGRAM: 2/2: Table slides 2/6: AA/ROM 2/29: Scapular Strengthening (yellow band) 3/5: Functional Reaching, gathering 10 items from shelf at shoulder height 3/12: Shoulder strengthening with dumbbells  GOALS: Goals reviewed with patient? Yes  SHORT TERM GOALS: Target date: 08/26/22  Pt will be provided with and educated on a HEP to improve mobility in RUE required for ADL completion. Goal status: IN PROGRESS  2.  Pt will  decrease pain in RUE to 3/10 or less in order to complete daily grooming tasks without taking a break due to pain.  Goal status: IN PROGRESS  3.  Pt will decrease RUE fascial restrictions to minimal amounts or less to improve mobility required for overhead reaching tasks. Goal status: IN PROGRESS  4.  Pt will increase A/ROM of RUE by 30 degrees or too WFL to improve ability to reach overhead and behind her back during dressing and bathing tasks.  Goal status: IN PROGRESS  5.  Pt will increase strength in RUE to 4+/5 to improve ability to perform lifting tasks required during cooking and cleaning.  Goal status: IN PROGRESS  6.  Pt will increase ROM in right digits to improve ability to form a full grasp required for holding items.  Goal status: IN PROGRESS   ASSESSMENT:  CLINICAL IMPRESSION: Pt continuing to work on ROM and overall strengthening. She is making minimal improvement with ROM, however her strength appears to be increasing incrementally. This session with each exercise, she complained of pain and required multiple short rest breaks. OT continued working with pt on functional reaching, this time gathering food items from shelf at shoulder height. When attempting to obtain items from overhead shelf, pt was only able to tap the actual shelf with her finger tips, she was unable to reach up enough to grasp any items. OT providing verbal, visual, and tactile cuing for positioning, technique, and sequencing.   PERFORMANCE DEFICITS: in functional skills including ADLs, IADLs, coordination, ROM, strength, pain, fascial restrictions, Fine motor control, Gross motor control, body mechanics, and UE functional use, cognitive skills including attention, memory, problem solving, and safety awareness, and psychosocial skills including coping strategies and environmental adaptation.    PLAN:  OT FREQUENCY: 2x/week  OT DURATION: 6 weeks  PLANNED INTERVENTIONS: self care/ADL training,  therapeutic exercise, therapeutic activity, manual therapy, passive range of motion, functional mobility training, electrical stimulation, ultrasound, moist heat, cryotherapy, patient/family education, coping strategies training, and DME and/or AE instructions   CONSULTED AND AGREED WITH PLAN OF CARE: Patient  PLAN FOR NEXT SESSION: AA/ROM, Wall Slides, functional reach into flexion, Reassessment   Paulita Fujita, OTR/L [place] [phone]

## 2022-08-25 ENCOUNTER — Encounter (HOSPITAL_COMMUNITY): Payer: Self-pay | Admitting: Occupational Therapy

## 2022-08-25 ENCOUNTER — Ambulatory Visit (HOSPITAL_COMMUNITY): Payer: Medicare Other | Admitting: Occupational Therapy

## 2022-08-25 DIAGNOSIS — M25511 Pain in right shoulder: Secondary | ICD-10-CM | POA: Diagnosis not present

## 2022-08-25 DIAGNOSIS — G8929 Other chronic pain: Secondary | ICD-10-CM

## 2022-08-25 DIAGNOSIS — R29898 Other symptoms and signs involving the musculoskeletal system: Secondary | ICD-10-CM

## 2022-08-25 DIAGNOSIS — M25611 Stiffness of right shoulder, not elsewhere classified: Secondary | ICD-10-CM

## 2022-08-25 NOTE — Patient Instructions (Signed)

## 2022-08-25 NOTE — Therapy (Signed)
[place] TREATMENT DISCHARGE NOTE  Patient Name: Sharon Fischer MRN: [id number] DOB:Nov 28, 1965, 57 y.o., female Today's Date: 08/25/2022  PCP: Royce Macadamia D., PA-C REFERRING PROVIDER: Nicholas Lose, MD  OCCUPATIONAL THERAPY DISCHARGE SUMMARY  Visits from Start of Care: 8  Current functional level related to goals / functional outcomes: Pt has met 2 out of 6 goals. She has been provided a comprehensive HEP and her fascial restrictions have decreased to overall minimal amounts.   Remaining deficits: Pt has not met 4 out of 6 goals. Her pain continues to be around 5-6/10 during activities and none at rest. Her ROM remains around 50% of full ROM with increased pain. Her strength has improved incrementally, however still remains on average around 4/5 for most movements. Finally, her fists have not made any improvements, she continues to make 75% of a fist in her R hand and 50% of fist with her LUE.   Education / Equipment: Pt was provided a functional and comprehensive HEP based on her abilities.    Plan: Patient agrees to discharge as she is only making incremental progress and continuing to have moderate to severe pain and muscle fatigue with ROM     END OF SESSION:  OT End of Session - 08/25/22 1127     Visit Number 8    Number of Visits 13    Date for OT Re-Evaluation 08/26/22    Authorization Type Medicaid [employer]    Authorization Time Period 7 visits approved 1/25-3/24/24    Authorization - Visit Number 7    Authorization - Number of Visits 7    Progress Note Due on Visit 10    OT Start Time [id number]    OT Stop Time 1201    OT Time Calculation (min) 38 min    Activity Tolerance Patient tolerated treatment well    Behavior During Therapy [facility] for tasks assessed/performed;Flat affect             Past Medical History:  Diagnosis Date   Breast cancer ([place]) 03/2020   Family history of throat cancer    High cholesterol    Hypertension     Past Surgical History:  Procedure Laterality Date   AXILLARY SENTINEL NODE BIOPSY Right 12/16/2020   Procedure: TARGETED RIGHT SENTINEL LYMPH NODE DISSECTION;  Surgeon: Jovita Kussmaul, MD;  Location: [place];  Service: General;  Laterality: Right;   BREAST CYST EXCISION Left 12/16/2020   Procedure: CYST EXCISION LEFT AXILLA;  Surgeon: Jovita Kussmaul, MD;  Location: [place];  Service: General;  Laterality: Left;   BREAST RECONSTRUCTION WITH PLACEMENT OF TISSUE EXPANDER AND FLEX HD (ACELLULAR HYDRATED DERMIS) Bilateral 12/16/2020   Procedure: IMMEDIATE BILATERAL BREAST RECONSTRUCTION WITH PLACEMENT OF TISSUE EXPANDER AND FLEX HD (ACELLULAR HYDRATED DERMIS);  Surgeon: Wallace Going, DO;  Location: [place];  Service: Plastics;  Laterality: Bilateral;   CESAREAN SECTION     x 2   IR IMAGING GUIDED PORT INSERTION  03/30/2020   PORT-A-CATH REMOVAL Right 12/16/2020   Procedure: REMOVAL PORT-A-CATH;  Surgeon: Jovita Kussmaul, MD;  Location: [place];  Service: General;  Laterality: Right;   REMOVAL OF BILATERAL TISSUE EXPANDERS WITH PLACEMENT OF BILATERAL BREAST IMPLANTS Bilateral 02/22/2021   Procedure: Removal of bilateral breast expanders without implant placement;  Surgeon: Wallace Going, DO;  Location: [place];  Service: Plastics;  Laterality: Bilateral;   TOTAL MASTECTOMY Bilateral 12/16/2020   Procedure: TOTAL MASTECTOMY;  Surgeon: Jovita Kussmaul, MD;  Location: [place];  Service: General;  Laterality: Bilateral;   Patient Active Problem List   Diagnosis Date Noted   Encounter for gynecological examination with Papanicolaou smear of cervix 12/09/2021   History of breast cancer 12/09/2021   Generalized body aches 12/09/2021   Encounter for screening fecal occult blood testing 12/09/2021   Routine cervical smear 12/09/2021   Hematuria 12/09/2021   Vaginal atrophy 12/09/2021   Dyspnea on exertion 04/27/2021   Hypertension 04/27/2021   Smoker 04/27/2021   Mixed  hyperlipidemia 04/27/2021   Breast cancer ([place]) 12/16/2020   Port-A-Cath in place 05/20/2020   Family history of throat cancer    Malignant neoplasm of upper-inner quadrant of left breast in female, estrogen receptor negative ([place]) 03/31/2020    ONSET DATE: 2021  REFERRING DIAG: Limited ROM of RUE  THERAPY DIAG:  Chronic right shoulder pain  Shoulder stiffness, right  Other symptoms and signs involving the musculoskeletal system  Rationale for Evaluation and Treatment: Rehabilitation  SUBJECTIVE:   SUBJECTIVE STATEMENT: S: "I did some laundry yesterday"  PERTINENT HISTORY: Pt was diagnosed with breast cancer in 2021. Pt underwent chemo, radiation, and double mastectomy. Since the radiation, pt reports that she has had difficulty lifting her RUE.  PRECAUTIONS: None  WEIGHT BEARING RESTRICTIONS: No  PAIN:  Are you having pain? Yes: NPRS scale: 7/10 Pain location: scapular region and anterior shoulder girdle Pain description: aching and sore Aggravating factors: movement and cold Relieving factors: unsure  FALLS: Has patient fallen in last 6 months? No   PATIENT GOALS: To improve RUE movement   OBJECTIVE:   HAND DOMINANCE: Right  ADLs: Overall ADLs: Pt reports that she is able to get dressed and shower with increased time and compensatory strategies, however she is unable to complete grooming, meal prep, cleaning, or other IADL's. Pt has difficulty with grasping and holding objects due to weakness in her hands and difficulty making a full fist.  FUNCTIONAL OUTCOME MEASURES: Upper Extremity Functional Scale (UEFS): 65%  UPPER EXTREMITY ROM:     Active ROM Right eval Right 08/25/22  Shoulder flexion 85 95  Shoulder abduction 63 79  Shoulder internal rotation 90 90  Shoulder external rotation -2 -5  (Blank rows = not tested)   UPPER EXTREMITY MMT:     MMT Right eval Right 08/25/22  Shoulder flexion 4+/5 4+/5  Shoulder abduction 4/5 4/5  Shoulder  adduction 4/5 4/5  Shoulder extension 3-/5 4+/5  Shoulder internal rotation 5/5 5/5  Shoulder external rotation 4-/5 4-/5  Elbow flexion 5/5 4+/5  Elbow extension 4/5 4/5  (Blank rows = not tested)  HAND FUNCTION: Grip strength: Right: 30 lbs; Left: 35 lbs, Lateral pinch: Right: 5 lbs, Left: 10 lbs, and 3 point pinch: Right: 5 lbs, Left: 7 lbs   COGNITION: Overall cognitive status: No family/caregiver present to determine baseline cognitive functioning Areas of impairment: Areas of impairment: Attention, Memory, Awareness, and Problem solving  Attention: Deficits Difficulty focusing on conversations, needs repetition Memory: Deficits Difficulty recalling events prior to coming to therapy Problem solving: Deficits Requires increased time and cuing  OBSERVATIONS: Unable to make a full fist in either hand. RUE 85% of a full fist, LUE 50% of a full fist   TODAY'S TREATMENT:  DATE: 08/25/22 -A/ROM: seated, flexion, abduction, protraction, horizontal abduction, er/IR, x10 -Shoulder Strengthening: 1lb dumbbell, flexion, abduction, protraction, horizontal abduction, er/IR, x10 -Bicep curls: 2lb dumbbells, x15 -Wall Slides: flexion, abduction, x10 -ROM for Reassessment  08/23/22 -A/ROM: seated, flexion, abduction, protraction, horizontal abduction, er/IR, x10 -Shoulder Strengthening: 1lb dumbbell, flexion, abduction, protraction, horizontal abduction, er/IR, x10 -Bicep curls: 2lb dumbbells, x15 -Ball Roll on the wall: flexion x10 -scapular strengthening: yellow theraband, extension, protraction, retraction, rows, x15 -Functional Reaching: retrieving and replacing 15 items from shelf at shoulder height -UBE: Level 2, 3.0+ RPM, 4' forwards  08/16/22 -Ball rolls: flexion, abduction, x15 -Wall Slides: flexion, abduction, x10 -Wall Wash: 1' flexion, 1'  abduction -A/ROM: seated, flexion, abduction, protraction, horizontal abduction, er/IR, x10 -Functional reaching: 1lb wrist weight, placing 10 pegs on shoulder height row -UBE: level 2, RPM 3.0+, 2.5' forwards and backwards    PATIENT EDUCATION: Education details: Reviewed HEP Person educated: Patient Education method: Explanation Education comprehension: verbalized understanding  HOME EXERCISE PROGRAM: 2/2: Table slides 2/6: AA/ROM 2/29: Scapular Strengthening (yellow band) 3/5: Functional Reaching, gathering 10 items from shelf at shoulder height 3/12: Shoulder strengthening with dumbbells 3/14: Wall Slides and A/ROM  GOALS: Goals reviewed with patient? Yes  SHORT TERM GOALS: Target date: 08/26/22  Pt will be provided with and educated on a HEP to improve mobility in RUE required for ADL completion. Goal status: MET  2.  Pt will decrease pain in RUE to 3/10 or less in order to complete daily grooming tasks without taking a break due to pain.  Goal status: NOT MET  3.  Pt will decrease RUE fascial restrictions to minimal amounts or less to improve mobility required for overhead reaching tasks. Goal status: MET  4.  Pt will increase A/ROM of RUE by 30 degrees or too WFL to improve ability to reach overhead and behind her back during dressing and bathing tasks.  Goal status: NOT MET  5.  Pt will increase strength in RUE to 4+/5 to improve ability to perform lifting tasks required during cooking and cleaning.  Goal status: IN PROGRESS  6.  Pt will increase ROM in right digits to improve ability to form a full grasp required for holding items.  Goal status: NOT MET   ASSESSMENT:  CLINICAL IMPRESSION: This session pt completed a reassessment for discharge. She has made limited progress since beginning OT treatments. Overall her ROM, strength, and pain levels continue to be limiting for her ADL's and IADL's. OT reviewed HEP with pt, who reports minimal compliance completing.  Education and encouragement provided for importance of HEP to maintain the limited progress that has been made, pt reporting understanding. At this time, no further skilled OT needs were addressed by patient and she will be discharged from OT, instructed to receive another referral if needs arise.   PERFORMANCE DEFICITS: in functional skills including ADLs, IADLs, coordination, ROM, strength, pain, fascial restrictions, Fine motor control, Gross motor control, body mechanics, and UE functional use, cognitive skills including attention, memory, problem solving, and safety awareness, and psychosocial skills including coping strategies and environmental adaptation.    PLAN:  OT FREQUENCY: Discharge   Kimani Bedoya Cornelius Moras [facility] [phone]

## 2022-10-06 ENCOUNTER — Ambulatory Visit: Payer: Medicaid Other | Admitting: Hematology and Oncology

## 2022-10-10 ENCOUNTER — Encounter: Payer: Self-pay | Admitting: Hematology and Oncology

## 2022-10-14 ENCOUNTER — Inpatient Hospital Stay: Payer: Medicaid Other | Attending: Adult Health | Admitting: Hematology and Oncology

## 2022-10-14 DIAGNOSIS — Z171 Estrogen receptor negative status [ER-]: Secondary | ICD-10-CM | POA: Diagnosis not present

## 2022-10-14 DIAGNOSIS — C50212 Malignant neoplasm of upper-inner quadrant of left female breast: Secondary | ICD-10-CM

## 2022-10-14 MED ORDER — DULOXETINE HCL 20 MG PO CPEP: 20.0000 mg | ORAL_CAPSULE | Freq: Every day | ORAL | 3 refills | Status: DC

## 2022-10-14 NOTE — Progress Notes (Signed)
HEMATOLOGY-ONCOLOGY TELEPHONE VISIT PROGRESS NOTE  I connected with our patient on 10/14/22 at 11:00 AM EDT by telephone and verified that I am speaking with the correct person using two identifiers.  I discussed the limitations, risks, security and privacy concerns of performing an evaluation and management service by telephone and the availability of in person appointments.  I also discussed with the patient that there may be a patient responsible charge related to this service. The patient expressed understanding and agreed to proceed.   History of Present Illness: Sharon Fischer is a  57 y.o. with above-mentioned history of  breast cancer who completed neoadjuvant chemotherapy and bilateral mastectomies. She presents to the clinic today for a telephone follow-up to discuss Mri results.   Oncology History  Malignant neoplasm of upper-inner quadrant of left breast in female, estrogen receptor negative (HCC)  01/2020 Initial Diagnosis   Palpable right breast mass growing since July 2021.  Mammogram revealed 2.2 cm right breast mass with a 0.8 cm satellite lesion.  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   03/31/2020 Cancer Staging   Staging form: Breast, AJCC 8th Edition - Clinical stage from 03/31/2020: Stage IIIB (cT2, cN1(f), cM0, G3, ER-, PR-, HER2-) - Signed by Serena Croissant, MD on 04/02/2020   04/09/2020 - 08/06/2020 Chemotherapy    Patient is on Treatment Plan: BREAST AC Q21D / CARBOPLATIN D1 + PACLITAXEL D1,8,15 Q21D, Keytruda discontinued for pneumonitis and Taxol discontinued after 4 cycles because of failure to thrive       12/16/2020 Surgery   Bilateral mastectomies   Left mastectomy: Intraductal papilloma, UDH Right mastectomy: No residual carcinoma was 1/3 lymph nodes positive ER negative, PR +20%, HER2 2+ by IHC, positive by FISH ratio 2.03   02/05/2021 -  05/21/2021 Chemotherapy   Patient is on Treatment Plan : BREAST Trastuzumab q21d       REVIEW OF SYSTEMS:   Constitutional: Denies fevers, chills or abnormal weight loss All other systems were reviewed with the patient and are negative. Observations/Objective:     Assessment Plan:  Malignant neoplasm of upper-inner quadrant of left breast in female, estrogen receptor negative (HCC) 01/2020: Palpable right breast mass growing since July 2021.  Mammogram revealed 2.2 cm right breast mass with a 0.8 cm satellite lesion.  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   Treatment plan  1. Neoadjuvant chemotherapy with Adriamycin and Cytoxan pembrolizumab 4 followed by Taxol weekly 4 with carboplatin pembrolizumab every 3 weeks x3 (chemo discontinued due to toxicities) Keytruda discontinued because of pneumonitis 2. bilateral mastectomies 12/16/2020: Left mastectomy intraductal papilloma, right mastectomy, no residual cancer in the breast 1/3 lymph nodes positive, ER negative, PR positive, HER2 2+ by IHC, FISH positive ratio 2.03, copy #6.2 3. Followed by adjuvant radiation therapy 4.  Herceptin every 3 weeks for 1 year started 02/05/2021 discontinued December 2022 -------------------------------------------------------------------------------------------------------------------------------------- Current treatment:  Surveillance   Shortness of breath: CT angiogram 03/19/2021: Negative for PE Echocardiogram 01/13/2021: EF 55 to 60%     Breast cancer surveillance: 1.  Breast exam 10/04/2021: Bilateral mastectomies no palpable lumps or nodules 2. no role of routine imaging studies since she had bilateral mastectomies.   Profound weight loss: She had lost almost 60 pounds over the past 2  years.  Her weight has now stabilized and she has gained a few pounds as well.  Pain in the hands: Due  to neuropathy: I sent a prescription for Cymbalta.   CT CAP 07/06/2022: Pleural thickening: Benign, hypovascular area right lobe of the liver, soft tissue anterior mediastinum: Benign in appearance Liver MRI 08/15/2022: Multiple small benign-appearing hepatic cysts.  No evidence of hepatic neoplasm.  Return to clinic in 1 year for follow-up   I discussed the assessment and treatment plan with the patient. The patient was provided an opportunity to ask questions and all were answered. The patient agreed with the plan and demonstrated an understanding of the instructions. The patient was advised to call back or seek an in-person evaluation if the symptoms worsen or if the condition fails to improve as anticipated.   I provided 12 minutes of non-face-to-face time during this encounter.  This includes time for charting and coordination of care   Tamsen Meek, MD  I Janan Ridge am acting as a scribe for Dr.Eliyana Pagliaro  I have reviewed the above documentation for accuracy and completeness, and I agree with the above.

## 2022-10-14 NOTE — Assessment & Plan Note (Signed)
01/2020: Palpable right breast mass growing since July 2021.  Mammogram revealed 2.2 cm right breast mass with a 0.8 cm satellite lesion.  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   Treatment plan  1. Neoadjuvant chemotherapy with Adriamycin and Cytoxan pembrolizumab 4 followed by Taxol weekly 4 with carboplatin pembrolizumab every 3 weeks x3 (chemo discontinued due to toxicities) Keytruda discontinued because of pneumonitis 2. bilateral mastectomies 12/16/2020: Left mastectomy intraductal papilloma, right mastectomy, no residual cancer in the breast 1/3 lymph nodes positive, ER negative, PR positive, HER2 2+ by IHC, FISH positive ratio 2.03, copy #6.2 3. Followed by adjuvant radiation therapy 4.  Herceptin every 3 weeks for 1 year started 02/05/2021 discontinued December 2022 -------------------------------------------------------------------------------------------------------------------------------------- Current treatment:  Surveillance   Shortness of breath: CT angiogram 03/19/2021: Negative for PE Echocardiogram 01/13/2021: EF 55 to 60%     Breast cancer surveillance: 1.  Breast exam 10/04/2021: Bilateral mastectomies no palpable lumps or nodules 2. no role of routine imaging studies since she had bilateral mastectomies.   Profound weight loss: She had lost almost 60 pounds over the past 2 years.     Pain in the hands: Due to neuropathy: I sent a prescription for Cymbalta.   CT CAP 07/06/2022: Pleural thickening: Benign, hypovascular area right lobe of the liver, soft tissue anterior mediastinum: Benign in appearance Liver MRI 08/15/2022: Multiple small benign-appearing hepatic cysts.  No evidence of hepatic neoplasm.  Return to clinic in 1 year for follow-up

## 2023-01-09 ENCOUNTER — Ambulatory Visit (HOSPITAL_COMMUNITY)
Admission: RE | Admit: 2023-01-09 | Discharge: 2023-01-09 | Disposition: A | Discharge status: Home / Self Care | Payer: Medicare Other | Source: Ambulatory Visit | Attending: Adult Health | Admitting: Adult Health

## 2023-01-09 DIAGNOSIS — R911 Solitary pulmonary nodule: Secondary | ICD-10-CM | POA: Diagnosis present

## 2023-01-09 DIAGNOSIS — Z9189 Other specified personal risk factors, not elsewhere classified: Secondary | ICD-10-CM | POA: Diagnosis present

## 2023-01-09 DIAGNOSIS — Z171 Estrogen receptor negative status [ER-]: Secondary | ICD-10-CM | POA: Diagnosis present

## 2023-01-09 DIAGNOSIS — K769 Liver disease, unspecified: Secondary | ICD-10-CM | POA: Diagnosis present

## 2023-01-09 DIAGNOSIS — C50212 Malignant neoplasm of upper-inner quadrant of left female breast: Secondary | ICD-10-CM | POA: Insufficient documentation

## 2023-01-09 MED ORDER — IOHEXOL 300 MG/ML  SOLN
75.0000 mL | Freq: Once | INTRAMUSCULAR | Status: AC | PRN
  Administered 2023-01-09: 75 mL via INTRAVENOUS

## 2023-01-10 ENCOUNTER — Inpatient Hospital Stay: Payer: Medicare Other | Admitting: Adult Health

## 2023-01-16 ENCOUNTER — Encounter: Payer: Self-pay | Admitting: Adult Health

## 2023-01-16 ENCOUNTER — Inpatient Hospital Stay: Payer: Medicare Other | Attending: Adult Health | Admitting: Adult Health

## 2023-01-16 DIAGNOSIS — Z171 Estrogen receptor negative status [ER-]: Secondary | ICD-10-CM | POA: Diagnosis not present

## 2023-01-16 DIAGNOSIS — C50212 Malignant neoplasm of upper-inner quadrant of left female breast: Secondary | ICD-10-CM | POA: Diagnosis not present

## 2023-01-16 NOTE — Assessment & Plan Note (Signed)
01/2020: Palpable right breast mass growing since July 2021.  Mammogram revealed 2.2 cm right breast mass with a 0.8 cm satellite lesion.  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   Treatment plan  1. Neoadjuvant chemotherapy with Adriamycin and Cytoxan pembrolizumab 4 followed by Taxol weekly 4 with carboplatin pembrolizumab every 3 weeks x3 (chemo discontinued due to toxicities) Keytruda discontinued because of pneumonitis 2. bilateral mastectomies 12/16/2020: Left mastectomy intraductal papilloma, right mastectomy, no residual cancer in the breast 1/3 lymph nodes positive, ER negative, PR positive, HER2 2+ by IHC, FISH positive ratio 2.03, copy #6.2 3. Followed by adjuvant radiation therapy 4.  Herceptin every 3 weeks for 1 year started 02/05/2021 discontinued December 2022 -------------------------------------------------------------------------------------------------------------------------------------- Current treatment:  Surveillance   Shortness of breath: CT angiogram 03/19/2021: Negative for PE Echocardiogram 01/13/2021: EF 55 to 60% CT chest July 2024: No evidence of metastatic disease.     Breast cancer surveillance: 1.  Breast exam 10/04/2021: Bilateral mastectomies no palpable lumps or nodules 2. no role of routine imaging studies since she had bilateral mastectomies.   Profound weight loss: She had lost almost 60 pounds over the past 2 years--this is stabilized. Neuropathy: On Cymbalta.  I recommended that she follow-up as suggested by Dr. Pamelia Hoit in May 2025.  She was instructed to continue with healthy diet, exercise and continue follow-up with her primary care provider.

## 2023-01-16 NOTE — Progress Notes (Signed)
[facility] Cancer Follow up:    Tylene Fantasia., PA-C [address] [place] [address]   DIAGNOSIS:  Cancer Staging  Malignant neoplasm of upper-inner quadrant of left breast in female, estrogen receptor negative (HCC) Staging form: Breast, AJCC 8th Edition - Clinical stage from 03/31/2020: Stage IIIB (cT2, cN1(f), cM0, G3, ER-, PR-, HER2-) - Signed by Serena Croissant, MD on 04/02/2020 Stage prefix: Initial diagnosis Method of lymph node assessment: Core biopsy Histologic grading system: 3 grade system  I connected with Kingsley Callander on 01/16/23 at  8:45 AM EDT by telephone and verified that I am speaking with the correct person using two identifiers.  I discussed the limitations, risks, security and privacy concerns of performing an evaluation and management service by telephone and the availability of in person appointments.  I also discussed with the patient that there may be a patient responsible charge related to this service. The patient expressed understanding and agreed to proceed.   Patient location: home Provider location: [facility] office  SUMMARY OF ONCOLOGIC HISTORY: Oncology History  Malignant neoplasm of upper-inner quadrant of left breast in female, estrogen receptor negative (HCC)  01/2020 Initial Diagnosis   Palpable right breast mass growing since July 2021.  Mammogram revealed 2.2 cm right breast mass with a 0.8 cm satellite lesion.  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   03/31/2020 Cancer Staging   Staging form: Breast, AJCC 8th Edition - Clinical stage from 03/31/2020: Stage IIIB (cT2, cN1(f), cM0, G3, ER-, PR-, HER2-) - Signed by Serena Croissant, MD on 04/02/2020   04/09/2020 - 08/06/2020 Chemotherapy    Patient is on Treatment Plan: BREAST AC Q21D / CARBOPLATIN D1 + PACLITAXEL D1,8,15 Q21D, Keytruda discontinued for  pneumonitis and Taxol discontinued after 4 cycles because of failure to thrive       12/16/2020 Surgery   Bilateral mastectomies   Left mastectomy: Intraductal papilloma, UDH Right mastectomy: No residual carcinoma was 1/3 lymph nodes positive ER negative, PR +20%, HER2 2+ by IHC, positive by FISH ratio 2.03   02/05/2021 - 05/21/2021 Chemotherapy   Patient is on Treatment Plan : BREAST Trastuzumab q21d       CURRENT THERAPY: observation  INTERVAL HISTORY: Aurea Bunce 57 y.o. female returns for follow up to discuss results of her most recent imaging with CT chest that was completed 01/11/2023.  Shaunna tells me that she is doing quite well.  She denies any significant issues.  The CT scan she underwent showed no evidence of malignancy in her chest.   Patient Active Problem List   Diagnosis Date Noted   Encounter for gynecological examination with Papanicolaou smear of cervix 12/09/2021   History of breast cancer 12/09/2021   Generalized body aches 12/09/2021   Encounter for screening fecal occult blood testing 12/09/2021   Routine cervical smear 12/09/2021   Hematuria 12/09/2021   Vaginal atrophy 12/09/2021   Dyspnea on exertion 04/27/2021   Hypertension 04/27/2021   Smoker 04/27/2021   Mixed hyperlipidemia 04/27/2021   Breast cancer (HCC) 12/16/2020   Port-A-Cath in place 05/20/2020   Family history of throat cancer    Malignant neoplasm of upper-inner quadrant of left breast in female, estrogen receptor negative (HCC) 03/31/2020    has No Known Allergies.  MEDICAL HISTORY: Past Medical History:  Diagnosis Date   Breast cancer (HCC) 03/2020  Family history of throat cancer    High cholesterol    Hypertension     SURGICAL HISTORY: Past Surgical History:  Procedure Laterality Date   AXILLARY SENTINEL NODE BIOPSY Right 12/16/2020   Procedure: TARGETED RIGHT SENTINEL LYMPH NODE DISSECTION;  Surgeon: Griselda Miner, MD;  Location: [facility] OR;  Service: General;   Laterality: Right;   BREAST CYST EXCISION Left 12/16/2020   Procedure: CYST EXCISION LEFT AXILLA;  Surgeon: Griselda Miner, MD;  Location: [facility] OR;  Service: General;  Laterality: Left;   BREAST RECONSTRUCTION WITH PLACEMENT OF TISSUE EXPANDER AND FLEX HD (ACELLULAR HYDRATED DERMIS) Bilateral 12/16/2020   Procedure: IMMEDIATE BILATERAL BREAST RECONSTRUCTION WITH PLACEMENT OF TISSUE EXPANDER AND FLEX HD (ACELLULAR HYDRATED DERMIS);  Surgeon: Peggye Form, DO;  Location: [facility] OR;  Service: Plastics;  Laterality: Bilateral;   CESAREAN SECTION     x 2   IR IMAGING GUIDED PORT INSERTION  03/30/2020   PORT-A-CATH REMOVAL Right 12/16/2020   Procedure: REMOVAL PORT-A-CATH;  Surgeon: Griselda Miner, MD;  Location: [facility] OR;  Service: General;  Laterality: Right;   REMOVAL OF BILATERAL TISSUE EXPANDERS WITH PLACEMENT OF BILATERAL BREAST IMPLANTS Bilateral 02/22/2021   Procedure: Removal of bilateral breast expanders without implant placement;  Surgeon: Peggye Form, DO;  Location: [facility];  Service: Plastics;  Laterality: Bilateral;   TOTAL MASTECTOMY Bilateral 12/16/2020   Procedure: TOTAL MASTECTOMY;  Surgeon: Griselda Miner, MD;  Location: [facility] OR;  Service: General;  Laterality: Bilateral;    SOCIAL HISTORY: Social History   Socioeconomic History   Marital status: Single    Spouse name: Not on file   Number of children: Not on file   Years of education: Not on file   Highest education level: Not on file  Occupational History   Not on file  Tobacco Use   Smoking status: Former    Current packs/day: 0.25    Average packs/day: 0.3 packs/day for 25.0 years (6.3 ttl pk-yrs)    Types: Cigarettes   Smokeless tobacco: Never   Tobacco comments:    7-8 cigs /day  Vaping Use   Vaping status: Never Used  Substance and Sexual Activity   Alcohol use: Not Currently   Drug use: Never   Sexual activity: Not Currently    Birth control/protection: Post-menopausal  Other  Topics Concern   Not on file  Social History Narrative   Not on file   Social Determinants of Health   Financial Resource Strain: Low Risk  (12/09/2021)   Overall Financial Resource Strain (CARDIA)    Difficulty of Paying Living Expenses: Not hard at all  Food Insecurity: No Food Insecurity (12/09/2021)   Hunger Vital Sign    Worried About Running Out of Food in the Last Year: Never true    Ran Out of Food in the Last Year: Never true  Transportation Needs: No Transportation Needs (12/09/2021)   PRAPARE - Administrator, Civil Service (Medical): No    Lack of Transportation (Non-Medical): No  Physical Activity: Insufficiently Active (12/09/2021)   Exercise Vital Sign    Days of Exercise per Week: 2 days    Minutes of Exercise per Session: 20 min  Stress: No Stress Concern Present (12/09/2021)   [employer] of Occupational Health - Occupational Stress Questionnaire    Feeling of Stress : Not at all  Social Connections: Socially Isolated (12/09/2021)   Social Connection and Isolation Panel [NHANES]    Frequency of Communication with  Friends and Family: Three times a week    Frequency of Social Gatherings with Friends and Family: Three times a week    Attends Religious Services: Never    Active Member of Clubs or Organizations: No    Attends Banker Meetings: Never    Marital Status: Never married  Intimate Partner Violence: Not At Risk (12/09/2021)   Humiliation, Afraid, Rape, and Kick questionnaire    Fear of Current or Ex-Partner: No    Emotionally Abused: No    Physically Abused: No    Sexually Abused: No    FAMILY HISTORY: Family History  Problem Relation Age of Onset   Throat cancer Maternal Uncle        dx >50, smoker   Throat cancer Maternal Uncle        dx >50, smoker   Cancer Cousin        unknown type, dx >50 (paternal first cousin)   Cancer Cousin        unknown type (paternal first cousin)    Review of Systems  Constitutional:   Negative for appetite change, chills, fatigue, fever and unexpected weight change.  HENT:   Negative for hearing loss, lump/mass and trouble swallowing.   Eyes:  Negative for eye problems and icterus.  Respiratory:  Negative for chest tightness, cough and shortness of breath.   Cardiovascular:  Negative for chest pain, leg swelling and palpitations.  Gastrointestinal:  Negative for abdominal distention, abdominal pain, constipation, diarrhea, nausea and vomiting.  Endocrine: Negative for hot flashes.  Genitourinary:  Negative for difficulty urinating.   Musculoskeletal:  Negative for arthralgias.  Skin:  Negative for itching and rash.  Neurological:  Negative for dizziness, extremity weakness, headaches and numbness.  Hematological:  Negative for adenopathy. Does not bruise/bleed easily.  Psychiatric/Behavioral:  Negative for depression. The patient is not nervous/anxious.       PHYSICAL EXAMINATION  Patient sounds well.  She is in no apparent distress.  Mood and behavior are normal.  Speech is normal.   ASSESSMENT and THERAPY PLAN:   Malignant neoplasm of upper-inner quadrant of left breast in female, estrogen receptor negative (HCC) 01/2020: Palpable right breast mass growing since July 2021.  Mammogram revealed 2.2 cm right breast mass with a 0.8 cm satellite lesion.  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   Treatment plan  1. Neoadjuvant chemotherapy with Adriamycin and Cytoxan pembrolizumab 4 followed by Taxol weekly 4 with carboplatin pembrolizumab every 3 weeks x3 (chemo discontinued due to toxicities) Keytruda discontinued because of pneumonitis 2. bilateral mastectomies 12/16/2020: Left mastectomy intraductal papilloma, right mastectomy, no residual cancer in the breast 1/3 lymph nodes positive, ER negative, PR positive, HER2 2+ by IHC, FISH positive ratio  2.03, copy #6.2 3. Followed by adjuvant radiation therapy 4.  Herceptin every 3 weeks for 1 year started 02/05/2021 discontinued December 2022 -------------------------------------------------------------------------------------------------------------------------------------- Current treatment:  Surveillance   Shortness of breath: CT angiogram 03/19/2021: Negative for PE Echocardiogram 01/13/2021: EF 55 to 60% CT chest July 2024: No evidence of metastatic disease.     Breast cancer surveillance: 1.  Breast exam 10/04/2021: Bilateral mastectomies no palpable lumps or nodules 2. no role of routine imaging studies since she had bilateral mastectomies.   Profound weight loss: She had lost almost 60 pounds over the past 2 years--this is stabilized. Neuropathy: On Cymbalta.  I recommended that  she follow-up as suggested by Dr. Pamelia Hoit in May 2025.  She was instructed to continue with healthy diet, exercise and continue follow-up with her primary care provider.   Follow up instructions:    -Return to cancer center 10/2023  The patient was provided an opportunity to ask questions and all were answered. The patient agreed with the plan and demonstrated an understanding of the instructions.   The patient was advised to call back or seek an in-person evaluation if the symptoms worsen or if the condition fails to improve as anticipated.   I provided 10 minutes of non face-to-face telephone visit time during this encounter, and > 50% was spent counseling as documented under my assessment & plan.  Lillard Anes, NP 01/16/23 9:28 AM Medical Oncology and Hematology [facility] [address] [place], [place] [address] Tel. [phone]    Fax. [phone]

## 2023-03-28 NOTE — Telephone Encounter (Signed)
TC

## 2023-04-23 ENCOUNTER — Emergency Department (HOSPITAL_COMMUNITY)
Admission: EM | Admit: 2023-04-23 | Discharge: 2023-04-23 | Disposition: A | Discharge status: Home / Self Care | Payer: Medicare Other | Attending: Emergency Medicine | Admitting: Emergency Medicine

## 2023-04-23 ENCOUNTER — Encounter (HOSPITAL_COMMUNITY): Payer: Self-pay

## 2023-04-23 ENCOUNTER — Other Ambulatory Visit: Payer: Self-pay

## 2023-04-23 DIAGNOSIS — Z79899 Other long term (current) drug therapy: Secondary | ICD-10-CM | POA: Insufficient documentation

## 2023-04-23 DIAGNOSIS — R531 Weakness: Secondary | ICD-10-CM | POA: Insufficient documentation

## 2023-04-23 DIAGNOSIS — Z853 Personal history of malignant neoplasm of breast: Secondary | ICD-10-CM | POA: Insufficient documentation

## 2023-04-23 DIAGNOSIS — I10 Essential (primary) hypertension: Secondary | ICD-10-CM | POA: Insufficient documentation

## 2023-04-23 DIAGNOSIS — R55 Syncope and collapse: Secondary | ICD-10-CM | POA: Diagnosis present

## 2023-04-23 LAB — COMPREHENSIVE METABOLIC PANEL
ALT: 16 U/L (ref 0–44)
AST: 28 U/L (ref 15–41)
Albumin: 3.6 g/dL (ref 3.5–5.0)
Alkaline Phosphatase: 116 U/L (ref 38–126)
Anion gap: 8 (ref 5–15)
BUN: 7 mg/dL (ref 6–20)
CO2: 25 mmol/L (ref 22–32)
Calcium: 9.1 mg/dL (ref 8.9–10.3)
Chloride: 102 mmol/L (ref 98–111)
Creatinine, Ser: 0.67 mg/dL (ref 0.44–1.00)
GFR, Estimated: >60 mL/min (ref >60)
Glucose, Bld: 107 mg/dL — ABNORMAL HIGH (ref 70–99)
Potassium: 4.3 mmol/L (ref 3.5–5.1)
Sodium: 135 mmol/L (ref 135–145)
Total Bilirubin: 0.8 mg/dL (ref <1.2)
Total Protein: 7 g/dL (ref 6.5–8.1)

## 2023-04-23 LAB — CBC WITH DIFFERENTIAL/PLATELET
Abs Immature Granulocytes: 0.01 10*3/uL (ref 0.00–0.07)
Basophils Absolute: 0.1 10*3/uL (ref 0.0–0.1)
Basophils Relative: 1 %
Eosinophils Absolute: 0.2 10*3/uL (ref 0.0–0.5)
Eosinophils Relative: 4 %
HCT: 38.4 % (ref 36.0–46.0)
Hemoglobin: 12.4 g/dL (ref 12.0–15.0)
Immature Granulocytes: 0 %
Lymphocytes Relative: 48 %
Lymphs Abs: 2.4 10*3/uL (ref 0.7–4.0)
MCH: 26.3 pg (ref 26.0–34.0)
MCHC: 32.3 g/dL (ref 30.0–36.0)
MCV: 81.5 fL (ref 80.0–100.0)
Monocytes Absolute: 0.4 10*3/uL (ref 0.1–1.0)
Monocytes Relative: 8 %
Neutro Abs: 1.9 10*3/uL (ref 1.7–7.7)
Neutrophils Relative %: 39 %
Platelets: 213 10*3/uL (ref 150–400)
RBC: 4.71 MIL/uL (ref 3.87–5.11)
RDW: 13.3 % (ref 11.5–15.5)
WBC: 4.9 10*3/uL (ref 4.0–10.5)
nRBC: 0 % (ref 0.0–0.2)

## 2023-04-23 LAB — URINALYSIS, ROUTINE W REFLEX MICROSCOPIC
Bacteria, UA: NONE SEEN
Bilirubin Urine: NEGATIVE
Glucose, UA: NEGATIVE mg/dL
Ketones, ur: NEGATIVE mg/dL
Nitrite: NEGATIVE
Protein, ur: NEGATIVE mg/dL
Specific Gravity, Urine: 1.01 (ref 1.005–1.030)
pH: 6 (ref 5.0–8.0)

## 2023-04-23 LAB — CBG MONITORING, ED: Glucose-Capillary: 82 mg/dL (ref 70–99)

## 2023-04-23 NOTE — ED Notes (Signed)
Reminded pt of need for urine sample; pt stated she didn't think she could provide one at this time

## 2023-04-23 NOTE — ED Triage Notes (Signed)
Pt stated that ever since she woke up this morning she has been feeling weak and feels like she is going to faint  Denies pain Denies cough and fever Denies HA

## 2023-04-23 NOTE — Discharge Instructions (Addendum)
Workup today is reassuring.  Suspect he may have been a bit dehydrated.  Make sure you hydrate yourself with water and Gatorade at home.  Also recommend follow-up PCP.  If you have an episode of passing out, chest pain, arrhythmia, or headache or visual disturbance or any other concerning symptom please return emerged part further evaluation.

## 2023-04-23 NOTE — ED Provider Notes (Signed)
[facility] AT [facility] Provider Note   CSN: [id number] Arrival date & time: 04/23/23  1309     History  Chief Complaint  Patient presents with   Weakness   HPI Sharon Fischer is a 57 y.o. female with history of breast cancer, hypertension high cholesterol presenting for near syncope.  States she woke up this morning and she felt like she was going to pass out.  She denies weakness or dizziness.  Denies headache or visual disturbance.  Denies any numbness or weakness in her extremities.  Denies chest pain shortness of breath or palpitations.  Denies urinary symptoms.  Denies abdominal pain or nausea vomiting diarrhea.  States she just felt like her "whole body was heavy" and felt she was in a pass out.  Denies any actual syncopal episode.  States she received her last chemotherapy 2 years ago and has been in remission of her breast cancer since.   Weakness      Home Medications Prior to Admission medications   Medication Sig Start Date End Date Taking? Authorizing Provider  DULoxetine (CYMBALTA) 20 MG capsule Take 1 capsule (20 mg total) by mouth daily. 10/14/22  Yes Serena Croissant, MD  ibuprofen (ADVIL) 600 MG tablet Take 300 mg by mouth daily as needed for headache.   Yes [provider]  prochlorperazine (COMPAZINE) 10 MG tablet Take 1 tablet (10 mg total) by mouth every 6 (six) hours as needed (Nausea or vomiting). Patient not taking: Reported on 12/03/2020 10/13/20 12/24/20  Serena Croissant, MD      Allergies    Patient has no known allergies.    Review of Systems   Review of Systems  Neurological:  Positive for weakness.    Physical Exam Updated Vital Signs BP 139/64 (BP Location: Left Arm)   Pulse (!) 106   Temp 97.8 F (36.6 C) (Oral)   Resp 17   Ht 5\' 7"  (1.702 m)   Wt 65.4 kg   SpO2 100%   BMI 22.57 kg/m  Physical Exam Vitals and nursing note reviewed.  HENT:     Head: Normocephalic and atraumatic.     Mouth/Throat:      Mouth: Mucous membranes are moist.  Eyes:     General:        Right eye: No discharge.        Left eye: No discharge.     Conjunctiva/sclera: Conjunctivae normal.  Cardiovascular:     Rate and Rhythm: Normal rate and regular rhythm.     Pulses: Normal pulses.     Heart sounds: Normal heart sounds.  Pulmonary:     Effort: Pulmonary effort is normal.     Breath sounds: Normal breath sounds.  Abdominal:     General: Abdomen is flat.     Palpations: Abdomen is soft.  Skin:    General: Skin is warm and dry.  Neurological:     General: No focal deficit present.     Comments: GCS 15. Speech is goal oriented. No deficits appreciated to CN III-XII; symmetric eyebrow raise, no facial drooping, tongue midline. Patient has equal grip strength bilaterally with 5/5 strength against resistance in all major muscle groups bilaterally. Sensation to light touch intact. Patient moves extremities without ataxia. Normal finger-nose-finger. Patient ambulatory with steady gait.  Psychiatric:        Mood and Affect: Mood normal.     ED Results / Procedures / Treatments   Labs (all labs ordered are listed, but only  abnormal results are displayed) Labs Reviewed  COMPREHENSIVE METABOLIC PANEL - Abnormal; Notable for the following components:      Result Value   Glucose, Bld 107 (*)    All other components within normal limits  URINALYSIS, ROUTINE W REFLEX MICROSCOPIC - Abnormal; Notable for the following components:   Hgb urine dipstick SMALL (*)    Leukocytes,Ua MODERATE (*)    All other components within normal limits  CBC WITH DIFFERENTIAL/PLATELET  CBG MONITORING, ED    EKG EKG Interpretation Date/Time:  Sunday April 23 2023 13:18:37 EST Ventricular Rate:  122 PR Interval:  152 QRS Duration:  64 QT Interval:  322 QTC Calculation: 458 R Axis:   74  Text Interpretation: Sinus tachycardia  HR faster compared to 2023 Confirmed by Pricilla Loveless [phone]) on 04/23/2023 2:07:22  PM  Radiology No results found.  Procedures Procedures    Medications Ordered in ED Medications - No data to display  ED Course/ Medical Decision Making/ A&P                                 Medical Decision Making Amount and/or Complexity of Data Reviewed Labs: ordered. ECG/medicine tests: ordered.   57 year old well-appearing female presenting for near syncope.  Exam was unremarkable with no focal neurodeficits.  DDx includes stroke, arrhythmia, dehydration, syncope, other.  Labs revealed hematuria and pyuria but otherwise reassuring.  EKG revealed sinus tachycardia.  Overall patient appears well with no focal neurodeficits and nontoxic.  Patient notably tachycardic on arrival.  Treated with oral hydration and heart rate did come down.  Suspect it is likely dehydration.  Doubt cardio neurogenic syncope.  Advised follow-up PCP.  Discussed per return precautions.  Vital stable.  Discharged in good condition.        Final Clinical Impression(s) / ED Diagnoses Final diagnoses:  Near syncope    Rx / DC Orders ED Discharge Orders     None         Gareth Eagle, PA-C 04/23/23 1650    Pricilla Loveless, MD 04/26/23 [phone]

## 2023-10-30 ENCOUNTER — Inpatient Hospital Stay: Payer: Medicare Other | Attending: Hematology and Oncology | Admitting: Hematology and Oncology

## 2023-10-30 VITALS — BP 148/60 | HR 104 | Temp 98.0°F | Resp 18 | Ht 67.0 in | Wt 142.2 lb

## 2023-10-30 DIAGNOSIS — C50212 Malignant neoplasm of upper-inner quadrant of left female breast: Secondary | ICD-10-CM

## 2023-10-30 DIAGNOSIS — H9209 Otalgia, unspecified ear: Secondary | ICD-10-CM | POA: Insufficient documentation

## 2023-10-30 DIAGNOSIS — R634 Abnormal weight loss: Secondary | ICD-10-CM | POA: Diagnosis present

## 2023-10-30 DIAGNOSIS — Z853 Personal history of malignant neoplasm of breast: Secondary | ICD-10-CM | POA: Insufficient documentation

## 2023-10-30 DIAGNOSIS — Z9013 Acquired absence of bilateral breasts and nipples: Secondary | ICD-10-CM | POA: Insufficient documentation

## 2023-10-30 DIAGNOSIS — R441 Visual hallucinations: Secondary | ICD-10-CM | POA: Insufficient documentation

## 2023-10-30 DIAGNOSIS — Z171 Estrogen receptor negative status [ER-]: Secondary | ICD-10-CM

## 2023-10-30 NOTE — Assessment & Plan Note (Signed)
 01/2020: Palpable right breast mass growing since July 2021.  Mammogram revealed 2.2 cm right breast mass with a 0.8 cm satellite lesion.  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   Treatment plan  1. 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 bilateral mastectomies 12/16/2020: Left mastectomy intraductal papilloma, right mastectomy, no residual cancer in the breast 1/3 lymph nodes positive, ER negative, PR positive, HER2 2+ by IHC, FISH positive ratio 2.03, copy #6.2 3. Followed by adjuvant radiation therapy 4.  Herceptin  every 3 weeks for 1 year started 02/05/2021 discontinued December 2022 -------------------------------------------------------------------------------------------------------------------------------------- Current treatment:  Surveillance   Shortness of breath: CT angiogram 03/19/2021: Negative for PE Echocardiogram 01/13/2021: EF 55 to 60%     Breast cancer surveillance: 1.  Breast exam 10/30/2023: Bilateral mastectomies no palpable lumps or nodules 2. no role of routine imaging studies since she had bilateral mastectomies.   Profound weight loss: She had lost almost 60 pounds over the past 2 years.  Her weight has now stabilized and she has gained a few pounds as well.  Pain in the hands: Due to neuropathy: I sent a prescription for Cymbalta .   CT CAP 07/06/2022: Pleural thickening: Benign, hypovascular area right lobe of the liver, soft tissue anterior mediastinum: Benign in appearance Liver MRI 08/15/2022: Multiple small benign-appearing hepatic cysts.  No evidence of hepatic neoplasm.   Return to clinic in 1 year for follow-up

## 2023-10-30 NOTE — Progress Notes (Signed)
 Patient Care Team: Patient, No Pcp Per as PCP - General (General Practice) Gerhard Knuckles, RN as Oncology Nurse Navigator (Oncology) Auther Bo, RN as Oncology Nurse Navigator Alane Hsu, RN as Oncology Nurse Navigator Cameron Cea, MD as Consulting Physician (Hematology and Oncology) Caralyn Chandler, MD as Consulting Physician (General Surgery)  DIAGNOSIS:  Encounter Diagnosis  Name Primary?   Malignant neoplasm of upper-inner quadrant of left breast in female, estrogen receptor negative (HCC) Yes    SUMMARY OF ONCOLOGIC HISTORY: Oncology History  Malignant neoplasm of upper-inner quadrant of left breast in female, estrogen receptor negative (HCC)  01/2020 Initial Diagnosis   Palpable right breast mass growing since July 2021.  Mammogram revealed 2.2 cm right breast mass with a 0.8 cm satellite lesion.  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   03/31/2020 Cancer Staging   Staging form: Breast, AJCC 8th Edition - Clinical stage from 03/31/2020: Stage IIIB (cT2, cN1(f), cM0, G3, ER-, PR-, HER2-) - Signed by Cameron Cea, MD on 04/02/2020   04/09/2020 - 08/06/2020 Chemotherapy    Patient is on Treatment Plan: BREAST AC Q21D / CARBOPLATIN  D1 + PACLITAXEL  D1,8,15 Q21D, Keytruda  discontinued for pneumonitis and Taxol  discontinued after 4 cycles because of failure to thrive       12/16/2020 Surgery   Bilateral mastectomies   Left mastectomy: Intraductal papilloma, UDH Right mastectomy: No residual carcinoma was 1/3 lymph nodes positive ER negative, PR +20%, HER2 2+ by IHC, positive by FISH ratio 2.03   02/05/2021 - 05/21/2021 Chemotherapy   Patient is on Treatment Plan : BREAST Trastuzumab  q21d       CHIEF COMPLIANT: Surveillance of breast cancer  HISTORY OF PRESENT ILLNESS:   History of Present Illness Sharon Fischer is a 58 year old female who  presents with weight management concerns and ear pain. She is accompanied by a family member who provides additional information about her symptoms.  Her weight has increased to 140 pounds from a previous low of 115 pounds, with a gain of approximately 10 to 12 pounds over the past three months. She feels good overall.  She experiences ear pain with dark spots on her earlobes. The pain is inside her ear, and her earring holes have closed up. There is no history of treatment for this issue.  For the past three to four years, she has experienced sensations of animals crawling on her body and pulling her hair. She describes seeing a black cat in her living room, although no one else can see it. An MRI of her brain conducted two years ago was normal.  She is currently taking a medication prescribed by her healthcare provider, but she is unsure of the name or dosage. No chest pain or discomfort.     ALLERGIES:  has no known allergies.  MEDICATIONS:  Current Outpatient Medications  Medication Sig Dispense Refill   DULoxetine  (CYMBALTA ) 20 MG capsule Take 1 capsule (20 mg total) by mouth daily. 90 capsule 3   ibuprofen  (ADVIL ) 600 MG tablet Take 300 mg by mouth daily as needed for headache.     No current facility-administered medications for this visit.    PHYSICAL EXAMINATION: ECOG PERFORMANCE STATUS: 1 - Symptomatic but completely ambulatory  Vitals:   10/30/23 1201  BP: (!) 148/60  Pulse: (!) 104  Resp: 18  Temp: 98 F (36.7 C)  SpO2: 100%   Filed Weights   10/30/23 1201  Weight: 142 lb 3.2 oz (64.5 kg)    Physical Exam HEENT: Minimal cerumen in ears. Fluid behind eardrum, not significant. Dark spot in ear, possibly swollen. CHEST: No breast tissue present.  (exam performed in the presence of a chaperone)  LABORATORY DATA:  I have reviewed the data as listed    Latest Ref Rng & Units 04/23/2023    1:36 PM 06/16/2022   12:59 PM 03/23/2022    2:27 PM  CMP  Glucose 70 -  99 mg/dL 130  70  95   BUN 6 - 20 mg/dL 7  7  12    Creatinine 0.44 - 1.00 mg/dL 8.65  7.84  6.96   Sodium 135 - 145 mmol/L 135  139  137   Potassium 3.5 - 5.1 mmol/L 4.3  3.8  3.7   Chloride 98 - 111 mmol/L 102  108  98   CO2 22 - 32 mmol/L 25  27  25    Calcium 8.9 - 10.3 mg/dL 9.1  9.4  9.1   Total Protein 6.5 - 8.1 g/dL 7.0  6.6    Total Bilirubin <1.2 mg/dL 0.8  0.5    Alkaline Phos 38 - 126 U/L 116  134    AST 15 - 41 U/L 28  25    ALT 0 - 44 U/L 16  11      Lab Results  Component Value Date   WBC 4.9 04/23/2023   HGB 12.4 04/23/2023   HCT 38.4 04/23/2023   MCV 81.5 04/23/2023   PLT 213 04/23/2023   NEUTROABS 1.9 04/23/2023    ASSESSMENT & PLAN:  Malignant neoplasm of upper-inner quadrant of left breast in female, estrogen receptor negative (HCC) 01/2020: Palpable right breast mass growing since July 2021.  Mammogram revealed 2.2 cm right breast mass with a 0.8 cm satellite lesion.  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   Treatment plan  1. 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 bilateral mastectomies 12/16/2020: Left mastectomy intraductal papilloma, right mastectomy, no residual cancer in the breast 1/3 lymph nodes positive, ER negative, PR positive, HER2 2+ by IHC, FISH positive ratio 2.03, copy #6.2 3. Followed by adjuvant radiation therapy 4.  Herceptin  every 3 weeks for 1 year started 02/05/2021 discontinued December 2022 -------------------------------------------------------------------------------------------------------------------------------------- Current treatment:  Surveillance   Shortness of breath: CT angiogram 03/19/2021: Negative for  PE Echocardiogram 01/13/2021: EF 55 to 60%     Breast cancer surveillance: 1.  Breast exam 10/30/2023: Bilateral mastectomies no palpable lumps or nodules 2. no role of routine imaging studies since she had bilateral mastectomies.   Profound weight loss: She had lost almost 60 pounds over the past 2 years.  Her weight has now stabilized and she has gained a few pounds as well.  Pain in the hands: Due to neuropathy: I sent a prescription for Cymbalta .   CT CAP 07/06/2022: Pleural thickening: Benign, hypovascular area right lobe of the liver, soft tissue anterior mediastinum: Benign in appearance Liver MRI 08/15/2022: Multiple small benign-appearing hepatic cysts.  No evidence of hepatic neoplasm.   Return to clinic in 1 year for follow-up ------------------------------------- Assessment and Plan Assessment & Plan Malignant  neoplasm of upper-inner quadrant of left breast, estrogen receptor negative Breast cancer diagnosed in 2021, asymptomatic, no recurrence. Discussed [facility]ant Reveal blood test for early detection and home monitoring. - Order Gardant Reveal blood test every six months. - Coordinate with [facility] for home blood draw.  Profound weight loss Previous weight loss improved from 115 lbs to 143 lbs.  Hallucinations Tactile and visual hallucinations ongoing for 3-4 years. Normal brain MRI. Recommended primary care consultation for further evaluation and psychologist referral. - Advise consultation with primary care physician for further evaluation and possible referral to a psychologist.  Ear pain with fluid behind eardrum Ear pain with fluid behind eardrum, likely allergy-related. No significant wax buildup. No immediate intervention unless symptoms worsen.      No orders of the defined types were placed in this encounter.  The patient has a good understanding of the overall plan. she agrees with it. she will call with any problems that may develop before the next visit  here. Total time spent: 30 mins including face to face time and time spent for planning, charting and co-ordination of care   Margert Sheerer, MD 10/30/23

## 2023-10-31 ENCOUNTER — Telehealth: Payer: Self-pay

## 2023-10-31 NOTE — Telephone Encounter (Signed)
 Per md orders entered for [facility] Reveal and all supported documents faxed to [phone]. Faxed confirmation was received.

## 2023-11-30 ENCOUNTER — Telehealth: Payer: Self-pay | Admitting: *Deleted

## 2023-11-30 NOTE — Telephone Encounter (Signed)
 Per MD request RN placed call to pt with recent Guardant Reveal results being negative.  Pt educated and verbalized understanding.

## 2023-12-04 ENCOUNTER — Encounter: Payer: Self-pay | Admitting: Hematology and Oncology

## 2024-02-12 ENCOUNTER — Other Ambulatory Visit: Payer: Self-pay | Admitting: Hematology and Oncology

## 2024-06-20 ENCOUNTER — Telehealth: Payer: Self-pay

## 2024-06-20 NOTE — Telephone Encounter (Signed)
 Pt cancelled mobile draw several times. Guardant is cancelled until further notice from pt.

## 2024-07-05 ENCOUNTER — Encounter: Payer: Self-pay | Admitting: *Deleted

## 2024-07-05 NOTE — Progress Notes (Signed)
 Received message from [facility] Reveal team stating pt scheduled mobile phlebotomy but did not answer the door or phone at the time of appointment.  States they attempt to contact pt to set up another appointment and pt was hostile on the phone.  Per MD, Guardant Reveal testing needing to be canceled at this time, will re-order if pt reaches out to the office.

## 2024-10-14 ENCOUNTER — Ambulatory Visit: Admitting: Hematology and Oncology
